# Patient Record
Sex: Female | Born: 1965 | Race: White | Hispanic: No | Smoking: Current every day smoker
Health system: Southern US, Community
[De-identification: ages and names within clinical notes are randomized; demographics above are authoritative.]

## PROBLEM LIST (undated history)

## (undated) DIAGNOSIS — F1020 Alcohol dependence, uncomplicated: Secondary | ICD-10-CM

## (undated) DIAGNOSIS — M545 Low back pain, unspecified: Secondary | ICD-10-CM

## (undated) DIAGNOSIS — F3110 Bipolar disorder, current episode manic without psychotic features, unspecified: Secondary | ICD-10-CM

## (undated) DIAGNOSIS — F99 Mental disorder, not otherwise specified: Secondary | ICD-10-CM

## (undated) DIAGNOSIS — E0590 Thyrotoxicosis, unspecified without thyrotoxic crisis or storm: Secondary | ICD-10-CM

## (undated) DIAGNOSIS — R51 Headache: Secondary | ICD-10-CM

## (undated) DIAGNOSIS — G8929 Other chronic pain: Secondary | ICD-10-CM

## (undated) DIAGNOSIS — J449 Chronic obstructive pulmonary disease, unspecified: Secondary | ICD-10-CM

## (undated) DIAGNOSIS — C801 Malignant (primary) neoplasm, unspecified: Secondary | ICD-10-CM

## (undated) HISTORY — PX: OTHER SURGICAL HISTORY: SHX169

## (undated) HISTORY — PX: TUBAL LIGATION: SHX77

## (undated) HISTORY — PX: BACK SURGERY: SHX140

---

## 1987-08-14 DIAGNOSIS — C801 Malignant (primary) neoplasm, unspecified: Secondary | ICD-10-CM

## 1987-08-14 HISTORY — DX: Malignant (primary) neoplasm, unspecified: C80.1

## 1991-08-14 DIAGNOSIS — E0590 Thyrotoxicosis, unspecified without thyrotoxic crisis or storm: Secondary | ICD-10-CM

## 1991-08-14 DIAGNOSIS — F3110 Bipolar disorder, current episode manic without psychotic features, unspecified: Secondary | ICD-10-CM

## 1991-08-14 HISTORY — DX: Bipolar disorder, current episode manic without psychotic features, unspecified: F31.10

## 1991-08-14 HISTORY — DX: Thyrotoxicosis, unspecified without thyrotoxic crisis or storm: E05.90

## 2001-08-13 HISTORY — PX: BREAST SURGERY: SHX581

## 2001-10-25 ENCOUNTER — Emergency Department (HOSPITAL_COMMUNITY): Admission: EM | Admit: 2001-10-25 | Discharge: 2001-10-25 | Payer: Self-pay | Admitting: Emergency Medicine

## 2001-11-14 ENCOUNTER — Emergency Department (HOSPITAL_COMMUNITY): Admission: EM | Admit: 2001-11-14 | Discharge: 2001-11-14 | Payer: Self-pay | Admitting: Emergency Medicine

## 2002-02-09 ENCOUNTER — Emergency Department (HOSPITAL_COMMUNITY): Admission: EM | Admit: 2002-02-09 | Discharge: 2002-02-09 | Payer: Self-pay | Admitting: Emergency Medicine

## 2002-05-08 ENCOUNTER — Emergency Department (HOSPITAL_COMMUNITY): Admission: EM | Admit: 2002-05-08 | Discharge: 2002-05-08 | Payer: Self-pay | Admitting: *Deleted

## 2002-07-19 ENCOUNTER — Emergency Department (HOSPITAL_COMMUNITY): Admission: EM | Admit: 2002-07-19 | Discharge: 2002-07-19 | Payer: Self-pay | Admitting: Internal Medicine

## 2002-07-19 ENCOUNTER — Encounter: Payer: Self-pay | Admitting: Internal Medicine

## 2003-08-02 ENCOUNTER — Ambulatory Visit (HOSPITAL_COMMUNITY): Admission: RE | Admit: 2003-08-02 | Discharge: 2003-08-02 | Payer: Self-pay | Admitting: Obstetrics & Gynecology

## 2003-08-20 ENCOUNTER — Emergency Department (HOSPITAL_COMMUNITY): Admission: EM | Admit: 2003-08-20 | Discharge: 2003-08-20 | Payer: Self-pay | Admitting: Emergency Medicine

## 2003-11-13 ENCOUNTER — Emergency Department (HOSPITAL_COMMUNITY): Admission: EM | Admit: 2003-11-13 | Discharge: 2003-11-13 | Payer: Self-pay | Admitting: Emergency Medicine

## 2004-02-16 ENCOUNTER — Emergency Department (HOSPITAL_COMMUNITY): Admission: EM | Admit: 2004-02-16 | Discharge: 2004-02-16 | Payer: Self-pay | Admitting: *Deleted

## 2004-03-01 ENCOUNTER — Inpatient Hospital Stay (HOSPITAL_COMMUNITY): Admission: RE | Admit: 2004-03-01 | Discharge: 2004-03-06 | Payer: Self-pay | Admitting: Psychiatry

## 2004-03-16 ENCOUNTER — Emergency Department (HOSPITAL_COMMUNITY): Admission: EM | Admit: 2004-03-16 | Discharge: 2004-03-16 | Payer: Self-pay | Admitting: *Deleted

## 2004-05-20 ENCOUNTER — Emergency Department (HOSPITAL_COMMUNITY): Admission: EM | Admit: 2004-05-20 | Discharge: 2004-05-20 | Payer: Self-pay | Admitting: Emergency Medicine

## 2006-02-11 ENCOUNTER — Emergency Department (HOSPITAL_COMMUNITY): Admission: EM | Admit: 2006-02-11 | Discharge: 2006-02-11 | Payer: Self-pay | Admitting: Emergency Medicine

## 2006-03-11 ENCOUNTER — Emergency Department (HOSPITAL_COMMUNITY): Admission: EM | Admit: 2006-03-11 | Discharge: 2006-03-11 | Payer: Self-pay | Admitting: Emergency Medicine

## 2006-05-21 ENCOUNTER — Emergency Department (HOSPITAL_COMMUNITY): Admission: EM | Admit: 2006-05-21 | Discharge: 2006-05-21 | Payer: Self-pay | Admitting: Emergency Medicine

## 2006-06-27 ENCOUNTER — Emergency Department (HOSPITAL_COMMUNITY): Admission: EM | Admit: 2006-06-27 | Discharge: 2006-06-27 | Payer: Self-pay | Admitting: Emergency Medicine

## 2006-08-13 HISTORY — PX: OTHER SURGICAL HISTORY: SHX169

## 2007-02-26 ENCOUNTER — Ambulatory Visit (HOSPITAL_COMMUNITY): Admission: RE | Admit: 2007-02-26 | Discharge: 2007-02-26 | Payer: Self-pay | Admitting: Family Medicine

## 2008-04-21 ENCOUNTER — Emergency Department (HOSPITAL_COMMUNITY): Admission: EM | Admit: 2008-04-21 | Discharge: 2008-04-21 | Payer: Self-pay | Admitting: Emergency Medicine

## 2008-06-09 ENCOUNTER — Emergency Department (HOSPITAL_COMMUNITY): Admission: EM | Admit: 2008-06-09 | Discharge: 2008-06-09 | Payer: Self-pay | Admitting: Emergency Medicine

## 2008-06-16 ENCOUNTER — Emergency Department (HOSPITAL_COMMUNITY): Admission: EM | Admit: 2008-06-16 | Discharge: 2008-06-16 | Payer: Self-pay | Admitting: Emergency Medicine

## 2008-06-21 ENCOUNTER — Ambulatory Visit (HOSPITAL_COMMUNITY): Admission: RE | Admit: 2008-06-21 | Discharge: 2008-06-21 | Payer: Self-pay | Admitting: Family Medicine

## 2008-11-03 ENCOUNTER — Emergency Department (HOSPITAL_COMMUNITY): Admission: EM | Admit: 2008-11-03 | Discharge: 2008-11-03 | Payer: Self-pay | Admitting: Emergency Medicine

## 2009-01-06 ENCOUNTER — Emergency Department (HOSPITAL_COMMUNITY): Admission: EM | Admit: 2009-01-06 | Discharge: 2009-01-06 | Payer: Self-pay | Admitting: Emergency Medicine

## 2009-05-10 ENCOUNTER — Emergency Department (HOSPITAL_COMMUNITY): Admission: EM | Admit: 2009-05-10 | Discharge: 2009-05-10 | Payer: Self-pay | Admitting: Emergency Medicine

## 2009-06-01 ENCOUNTER — Emergency Department (HOSPITAL_COMMUNITY): Admission: EM | Admit: 2009-06-01 | Discharge: 2009-06-01 | Payer: Self-pay | Admitting: Emergency Medicine

## 2010-01-15 ENCOUNTER — Emergency Department (HOSPITAL_COMMUNITY): Admission: EM | Admit: 2010-01-15 | Discharge: 2010-01-15 | Payer: Self-pay | Admitting: Emergency Medicine

## 2010-03-12 ENCOUNTER — Emergency Department (HOSPITAL_COMMUNITY): Admission: EM | Admit: 2010-03-12 | Discharge: 2010-03-12 | Payer: Self-pay | Admitting: Emergency Medicine

## 2010-03-30 ENCOUNTER — Other Ambulatory Visit: Payer: Self-pay | Admitting: Emergency Medicine

## 2010-03-30 ENCOUNTER — Inpatient Hospital Stay (HOSPITAL_COMMUNITY): Admission: AD | Admit: 2010-03-30 | Discharge: 2010-04-07 | Payer: Self-pay | Admitting: Psychiatry

## 2010-03-30 ENCOUNTER — Ambulatory Visit: Payer: Self-pay | Admitting: Psychiatry

## 2010-04-20 ENCOUNTER — Emergency Department (HOSPITAL_COMMUNITY): Admission: EM | Admit: 2010-04-20 | Discharge: 2010-04-20 | Payer: Self-pay | Admitting: Emergency Medicine

## 2010-04-25 ENCOUNTER — Emergency Department (HOSPITAL_COMMUNITY): Admission: EM | Admit: 2010-04-25 | Discharge: 2010-04-26 | Payer: Self-pay | Admitting: Emergency Medicine

## 2010-05-06 ENCOUNTER — Emergency Department (HOSPITAL_COMMUNITY): Admission: EM | Admit: 2010-05-06 | Discharge: 2010-05-06 | Payer: Self-pay | Admitting: Emergency Medicine

## 2010-05-15 ENCOUNTER — Ambulatory Visit (HOSPITAL_COMMUNITY): Admission: RE | Admit: 2010-05-15 | Discharge: 2010-05-15 | Payer: Self-pay | Admitting: Family Medicine

## 2010-05-18 ENCOUNTER — Emergency Department (HOSPITAL_COMMUNITY): Admission: EM | Admit: 2010-05-18 | Discharge: 2010-05-18 | Payer: Self-pay | Admitting: Emergency Medicine

## 2010-06-03 ENCOUNTER — Emergency Department (HOSPITAL_COMMUNITY): Admission: EM | Admit: 2010-06-03 | Discharge: 2010-06-03 | Payer: Self-pay | Admitting: Emergency Medicine

## 2010-07-19 ENCOUNTER — Emergency Department (HOSPITAL_COMMUNITY)
Admission: EM | Admit: 2010-07-19 | Discharge: 2010-07-19 | Payer: Self-pay | Source: Home / Self Care | Admitting: Emergency Medicine

## 2010-07-25 ENCOUNTER — Emergency Department (HOSPITAL_COMMUNITY)
Admission: EM | Admit: 2010-07-25 | Discharge: 2010-07-25 | Payer: Self-pay | Source: Home / Self Care | Admitting: Emergency Medicine

## 2010-07-28 ENCOUNTER — Emergency Department (HOSPITAL_COMMUNITY)
Admission: EM | Admit: 2010-07-28 | Discharge: 2010-07-28 | Payer: Self-pay | Source: Home / Self Care | Admitting: Emergency Medicine

## 2010-08-05 ENCOUNTER — Emergency Department (HOSPITAL_COMMUNITY)
Admission: EM | Admit: 2010-08-05 | Discharge: 2010-08-05 | Payer: Self-pay | Source: Home / Self Care | Admitting: Emergency Medicine

## 2010-08-14 ENCOUNTER — Emergency Department (HOSPITAL_COMMUNITY)
Admission: EM | Admit: 2010-08-14 | Discharge: 2010-08-14 | Payer: Self-pay | Source: Home / Self Care | Admitting: Emergency Medicine

## 2010-09-03 ENCOUNTER — Encounter: Payer: Self-pay | Admitting: Family Medicine

## 2010-10-13 ENCOUNTER — Emergency Department (HOSPITAL_COMMUNITY): Payer: Medicare Other

## 2010-10-13 ENCOUNTER — Emergency Department (HOSPITAL_COMMUNITY)
Admission: EM | Admit: 2010-10-13 | Discharge: 2010-10-14 | Disposition: A | Discharge status: Home / Self Care | Payer: Medicare Other | Attending: Emergency Medicine | Admitting: Emergency Medicine

## 2010-10-13 DIAGNOSIS — F1420 Cocaine dependence, uncomplicated: Secondary | ICD-10-CM | POA: Insufficient documentation

## 2010-10-13 DIAGNOSIS — R51 Headache: Secondary | ICD-10-CM | POA: Insufficient documentation

## 2010-10-13 DIAGNOSIS — F10229 Alcohol dependence with intoxication, unspecified: Secondary | ICD-10-CM | POA: Insufficient documentation

## 2010-10-13 DIAGNOSIS — R05 Cough: Secondary | ICD-10-CM | POA: Insufficient documentation

## 2010-10-13 DIAGNOSIS — R059 Cough, unspecified: Secondary | ICD-10-CM | POA: Insufficient documentation

## 2010-10-13 LAB — BASIC METABOLIC PANEL
BUN: 3 mg/dL — ABNORMAL LOW (ref 6–23)
CO2: 22 mEq/L (ref 19–32)
Calcium: 8.8 mg/dL (ref 8.4–10.5)
GFR calc non Af Amer: >60 mL/min (ref >60)
Glucose, Bld: 92 mg/dL (ref 70–99)

## 2010-10-14 LAB — RAPID URINE DRUG SCREEN, HOSP PERFORMED
Barbiturates: NOT DETECTED
Benzodiazepines: NOT DETECTED
Tetrahydrocannabinol: NOT DETECTED

## 2010-10-14 LAB — POCT CARDIAC MARKERS
CKMB, poc: <1 ng/mL — ABNORMAL LOW (ref 1.0–8.0)
Troponin i, poc: <0.05 ng/mL (ref 0.00–0.09)

## 2010-10-14 LAB — URINALYSIS, ROUTINE W REFLEX MICROSCOPIC
Glucose, UA: NEGATIVE mg/dL
pH: 5 (ref 5.0–8.0)

## 2010-10-23 LAB — RAPID URINE DRUG SCREEN, HOSP PERFORMED
Amphetamines: NOT DETECTED
Amphetamines: NOT DETECTED
Barbiturates: NOT DETECTED
Barbiturates: NOT DETECTED
Cocaine: NOT DETECTED
Cocaine: NOT DETECTED
Opiates: NOT DETECTED
Opiates: NOT DETECTED
Tetrahydrocannabinol: POSITIVE — AB
Tetrahydrocannabinol: NOT DETECTED

## 2010-10-23 LAB — COMPREHENSIVE METABOLIC PANEL
ALT: 35 U/L (ref 0–35)
AST: 70 U/L — ABNORMAL HIGH (ref 0–37)
Albumin: 3.9 g/dL (ref 3.5–5.2)
Albumin: 4 g/dL (ref 3.5–5.2)
BUN: 4 mg/dL — ABNORMAL LOW (ref 6–23)
CO2: 23 mEq/L (ref 19–32)
Calcium: 8.7 mg/dL (ref 8.4–10.5)
Calcium: 8.8 mg/dL (ref 8.4–10.5)
Creatinine, Ser: 0.7 mg/dL (ref 0.4–1.2)
Creatinine, Ser: 0.71 mg/dL (ref 0.4–1.2)
GFR calc Af Amer: >60 mL/min (ref >60)
GFR calc non Af Amer: >60 mL/min (ref >60)
GFR calc non Af Amer: >60 mL/min (ref >60)
Total Bilirubin: 0.5 mg/dL (ref 0.3–1.2)
Total Bilirubin: 0.5 mg/dL (ref 0.3–1.2)
Total Protein: 7.5 g/dL (ref 6.0–8.3)

## 2010-10-23 LAB — DIFFERENTIAL
Basophils Absolute: 0 10*3/uL (ref 0.0–0.1)
Basophils Relative: 0 % (ref 0–1)
Eosinophils Absolute: 0.1 10*3/uL (ref 0.0–0.7)
Eosinophils Absolute: 0.1 10*3/uL (ref 0.0–0.7)
Eosinophils Relative: 1 % (ref 0–5)
Lymphocytes Relative: 50 % — ABNORMAL HIGH (ref 12–46)
Lymphs Abs: 3.2 10*3/uL (ref 0.7–4.0)
Monocytes Absolute: 0.6 10*3/uL (ref 0.1–1.0)
Monocytes Absolute: 0.6 10*3/uL (ref 0.1–1.0)
Monocytes Relative: 8 % (ref 3–12)
Neutrophils Relative %: 40 % — ABNORMAL LOW (ref 43–77)

## 2010-10-23 LAB — BASIC METABOLIC PANEL
CO2: 20 mEq/L (ref 19–32)
Calcium: 8.5 mg/dL (ref 8.4–10.5)
Chloride: 104 mEq/L (ref 96–112)
GFR calc Af Amer: >60 mL/min (ref >60)
Glucose, Bld: 73 mg/dL (ref 70–99)
Potassium: 3.8 mEq/L (ref 3.5–5.1)

## 2010-10-23 LAB — URINALYSIS, ROUTINE W REFLEX MICROSCOPIC
Bilirubin Urine: NEGATIVE
Glucose, UA: NEGATIVE mg/dL
Protein, ur: NEGATIVE mg/dL
Urobilinogen, UA: 0.2 mg/dL (ref 0.0–1.0)

## 2010-10-23 LAB — CBC
Hemoglobin: 15.7 g/dL — ABNORMAL HIGH (ref 12.0–15.0)
MCH: 36.1 pg — ABNORMAL HIGH (ref 26.0–34.0)
MCH: 36.6 pg — ABNORMAL HIGH (ref 26.0–34.0)
MCHC: 37.3 g/dL — ABNORMAL HIGH (ref 30.0–36.0)
MCV: 100.2 fL — ABNORMAL HIGH (ref 78.0–100.0)
MCV: 98.3 fL (ref 78.0–100.0)
Platelets: 191 10*3/uL (ref 150–400)
RBC: 4.35 MIL/uL (ref 3.87–5.11)

## 2010-10-23 LAB — LIPASE, BLOOD: Lipase: 43 U/L (ref 11–59)

## 2010-10-25 LAB — COMPREHENSIVE METABOLIC PANEL
ALT: 12 U/L (ref 0–35)
AST: 24 U/L (ref 0–37)
Alkaline Phosphatase: 65 U/L (ref 39–117)
CO2: 23 mEq/L (ref 19–32)
Calcium: 8.7 mg/dL (ref 8.4–10.5)
GFR calc Af Amer: >60 mL/min (ref >60)
GFR calc non Af Amer: >60 mL/min (ref >60)
Glucose, Bld: 81 mg/dL (ref 70–99)
Potassium: 4.3 mEq/L (ref 3.5–5.1)
Sodium: 132 mEq/L — ABNORMAL LOW (ref 135–145)

## 2010-10-25 LAB — URINALYSIS, ROUTINE W REFLEX MICROSCOPIC
Bilirubin Urine: NEGATIVE
Glucose, UA: NEGATIVE mg/dL
Glucose, UA: NEGATIVE mg/dL
Hgb urine dipstick: NEGATIVE
Ketones, ur: NEGATIVE mg/dL
Protein, ur: NEGATIVE mg/dL
Protein, ur: NEGATIVE mg/dL
Specific Gravity, Urine: 1.01 (ref 1.005–1.030)
pH: 5 (ref 5.0–8.0)
pH: 5.5 (ref 5.0–8.0)

## 2010-10-25 LAB — CBC
HCT: 42.4 % (ref 36.0–46.0)
HCT: 41.9 % (ref 36.0–46.0)
Hemoglobin: 14.8 g/dL (ref 12.0–15.0)
MCH: 35.8 pg — ABNORMAL HIGH (ref 26.0–34.0)
MCHC: 34.8 g/dL (ref 30.0–36.0)
MCHC: 34.5 g/dL (ref 30.0–36.0)
MCV: 103.9 fL — ABNORMAL HIGH (ref 78.0–100.0)
RBC: 4.1 MIL/uL (ref 3.87–5.11)
RDW: 13.6 % (ref 11.5–15.5)
WBC: 7.9 10*3/uL (ref 4.0–10.5)
WBC: 8.1 10*3/uL (ref 4.0–10.5)

## 2010-10-25 LAB — HEPATIC FUNCTION PANEL
ALT: 17 U/L (ref 0–35)
Bilirubin, Direct: 0.1 mg/dL (ref 0.0–0.3)
Indirect Bilirubin: 0.7 mg/dL (ref 0.3–0.9)

## 2010-10-25 LAB — DIFFERENTIAL
Basophils Absolute: 0.1 10*3/uL (ref 0.0–0.1)
Basophils Relative: 0 % (ref 0–1)
Eosinophils Absolute: 0 10*3/uL (ref 0.0–0.7)
Eosinophils Absolute: 0 10*3/uL (ref 0.0–0.7)
Eosinophils Relative: 0 % (ref 0–5)
Eosinophils Relative: 1 % (ref 0–5)
Lymphocytes Relative: 41 % (ref 12–46)
Lymphs Abs: 2.9 10*3/uL (ref 0.7–4.0)
Monocytes Absolute: 0.7 10*3/uL (ref 0.1–1.0)
Monocytes Relative: 7 % (ref 3–12)
Neutrophils Relative %: 54 % (ref 43–77)

## 2010-10-25 LAB — RAPID URINE DRUG SCREEN, HOSP PERFORMED
Barbiturates: NOT DETECTED
Benzodiazepines: POSITIVE — AB
Benzodiazepines: POSITIVE — AB
Cocaine: NOT DETECTED
Opiates: NOT DETECTED

## 2010-10-25 LAB — BASIC METABOLIC PANEL
BUN: 8 mg/dL (ref 6–23)
CO2: 20 mEq/L (ref 19–32)
Chloride: 102 mEq/L (ref 96–112)
Glucose, Bld: 76 mg/dL (ref 70–99)
Potassium: 3.7 mEq/L (ref 3.5–5.1)

## 2010-10-25 LAB — ETHANOL: Alcohol, Ethyl (B): 252 mg/dL — ABNORMAL HIGH (ref 0–10)

## 2010-10-25 LAB — URINE MICROSCOPIC-ADD ON

## 2010-10-25 LAB — AMMONIA: Ammonia: 19 umol/L (ref 11–35)

## 2010-10-25 LAB — POCT PREGNANCY, URINE: Preg Test, Ur: NEGATIVE

## 2010-10-26 LAB — BASIC METABOLIC PANEL
BUN: 6 mg/dL (ref 6–23)
BUN: 3 mg/dL — ABNORMAL LOW (ref 6–23)
BUN: 3 mg/dL — ABNORMAL LOW (ref 6–23)
CO2: 21 mEq/L (ref 19–32)
CO2: 21 mEq/L (ref 19–32)
Calcium: 9.2 mg/dL (ref 8.4–10.5)
Chloride: 107 mEq/L (ref 96–112)
Chloride: 108 mEq/L (ref 96–112)
Creatinine, Ser: 0.61 mg/dL (ref 0.4–1.2)
Creatinine, Ser: 0.61 mg/dL (ref 0.4–1.2)
GFR calc Af Amer: >60 mL/min (ref >60)
GFR calc non Af Amer: >60 mL/min (ref >60)
Glucose, Bld: 91 mg/dL (ref 70–99)
Glucose, Bld: 92 mg/dL (ref 70–99)
Potassium: 4.2 mEq/L (ref 3.5–5.1)
Sodium: 138 mEq/L (ref 135–145)

## 2010-10-26 LAB — CBC
HCT: 40.9 % (ref 36.0–46.0)
HCT: 40.6 % (ref 36.0–46.0)
HCT: 46.7 % — ABNORMAL HIGH (ref 36.0–46.0)
MCH: 35.7 pg — ABNORMAL HIGH (ref 26.0–34.0)
MCH: 35.8 pg — ABNORMAL HIGH (ref 26.0–34.0)
MCHC: 34.7 g/dL (ref 30.0–36.0)
MCHC: 33.6 g/dL (ref 30.0–36.0)
MCV: 103.4 fL — ABNORMAL HIGH (ref 78.0–100.0)
MCV: 103.4 fL — ABNORMAL HIGH (ref 78.0–100.0)
Platelets: 291 10*3/uL (ref 150–400)
Platelets: 264 10*3/uL (ref 150–400)
RBC: 3.96 MIL/uL (ref 3.87–5.11)
RDW: 13.1 % (ref 11.5–15.5)
RDW: 13.6 % (ref 11.5–15.5)
WBC: 7 10*3/uL (ref 4.0–10.5)
WBC: 7.2 10*3/uL (ref 4.0–10.5)

## 2010-10-26 LAB — DIFFERENTIAL
Basophils Absolute: 0 10*3/uL (ref 0.0–0.1)
Basophils Absolute: 0 10*3/uL (ref 0.0–0.1)
Basophils Relative: 0 % (ref 0–1)
Eosinophils Absolute: 0.1 10*3/uL (ref 0.0–0.7)
Eosinophils Absolute: 0.1 10*3/uL (ref 0.0–0.7)
Eosinophils Relative: 1 % (ref 0–5)
Eosinophils Relative: 2 % (ref 0–5)
Lymphocytes Relative: 44 % (ref 12–46)
Lymphs Abs: 3.3 10*3/uL (ref 0.7–4.0)
Monocytes Absolute: 0.4 10*3/uL (ref 0.1–1.0)
Monocytes Absolute: 0.5 10*3/uL (ref 0.1–1.0)
Monocytes Absolute: 0.5 10*3/uL (ref 0.1–1.0)
Neutro Abs: 3.5 10*3/uL (ref 1.7–7.7)
Neutrophils Relative %: 48 % (ref 43–77)

## 2010-10-26 LAB — RAPID URINE DRUG SCREEN, HOSP PERFORMED
Amphetamines: NOT DETECTED
Barbiturates: NOT DETECTED
Benzodiazepines: NOT DETECTED
Cocaine: NOT DETECTED
Cocaine: POSITIVE — AB
Tetrahydrocannabinol: NOT DETECTED

## 2010-10-26 LAB — COMPREHENSIVE METABOLIC PANEL
ALT: 21 U/L (ref 0–35)
AST: 63 U/L — ABNORMAL HIGH (ref 0–37)
Albumin: 4.2 g/dL (ref 3.5–5.2)
Alkaline Phosphatase: 79 U/L (ref 39–117)
Chloride: 105 mEq/L (ref 96–112)
GFR calc Af Amer: >60 mL/min (ref >60)
Potassium: 3.9 mEq/L (ref 3.5–5.1)
Sodium: 137 mEq/L (ref 135–145)
Total Bilirubin: 1.2 mg/dL (ref 0.3–1.2)
Total Protein: 8 g/dL (ref 6.0–8.3)

## 2010-10-26 LAB — URINALYSIS, ROUTINE W REFLEX MICROSCOPIC
Bilirubin Urine: NEGATIVE
Hgb urine dipstick: NEGATIVE
Ketones, ur: NEGATIVE mg/dL
Nitrite: NEGATIVE
Urobilinogen, UA: 0.2 mg/dL (ref 0.0–1.0)

## 2010-10-26 LAB — ETHANOL
Alcohol, Ethyl (B): 240 mg/dL — ABNORMAL HIGH (ref 0–10)
Alcohol, Ethyl (B): 297 mg/dL — ABNORMAL HIGH (ref 0–10)

## 2010-10-26 LAB — TSH: TSH: 11.134 u[IU]/mL — ABNORMAL HIGH (ref 0.350–4.500)

## 2010-10-26 LAB — PREGNANCY, URINE: Preg Test, Ur: NEGATIVE

## 2010-10-28 LAB — URINE MICROSCOPIC-ADD ON

## 2010-10-28 LAB — URINALYSIS, ROUTINE W REFLEX MICROSCOPIC
Bilirubin Urine: NEGATIVE
Glucose, UA: NEGATIVE mg/dL
Ketones, ur: NEGATIVE mg/dL
Leukocytes, UA: NEGATIVE
Nitrite: NEGATIVE
Protein, ur: NEGATIVE mg/dL
Specific Gravity, Urine: <1.005 — ABNORMAL LOW (ref 1.005–1.030)
Urobilinogen, UA: 0.2 mg/dL (ref 0.0–1.0)
pH: 6 (ref 5.0–8.0)

## 2010-10-30 LAB — RAPID URINE DRUG SCREEN, HOSP PERFORMED
Amphetamines: NOT DETECTED
Barbiturates: NOT DETECTED
Benzodiazepines: POSITIVE — AB
Cocaine: NOT DETECTED
Opiates: NOT DETECTED
Tetrahydrocannabinol: NOT DETECTED

## 2010-10-30 LAB — DIFFERENTIAL
Basophils Absolute: 0.1 K/uL (ref 0.0–0.1)
Basophils Relative: 1 % (ref 0–1)
Eosinophils Absolute: 0.1 K/uL (ref 0.0–0.7)
Eosinophils Relative: 1 % (ref 0–5)
Lymphocytes Relative: 24 % (ref 12–46)
Lymphs Abs: 1.9 K/uL (ref 0.7–4.0)
Monocytes Absolute: 0.6 K/uL (ref 0.1–1.0)
Monocytes Relative: 7 % (ref 3–12)
Neutro Abs: 5.3 10*3/uL (ref 1.7–7.7)
Neutrophils Relative %: 67 % (ref 43–77)

## 2010-10-30 LAB — GLUCOSE, CAPILLARY: Glucose-Capillary: 88 mg/dL (ref 70–99)

## 2010-10-30 LAB — COMPREHENSIVE METABOLIC PANEL
AST: 38 U/L — ABNORMAL HIGH (ref 0–37)
Albumin: 3.4 g/dL — ABNORMAL LOW (ref 3.5–5.2)
BUN: 4 mg/dL — ABNORMAL LOW (ref 6–23)
Creatinine, Ser: 0.66 mg/dL (ref 0.4–1.2)
GFR calc Af Amer: >60 mL/min (ref >60)
Potassium: 4 mEq/L (ref 3.5–5.1)
Total Protein: 6.6 g/dL (ref 6.0–8.3)

## 2010-10-30 LAB — COMPREHENSIVE METABOLIC PANEL WITH GFR
ALT: 16 U/L (ref 0–35)
Alkaline Phosphatase: 73 U/L (ref 39–117)
CO2: 23 meq/L (ref 19–32)
Calcium: 8.7 mg/dL (ref 8.4–10.5)
Chloride: 103 meq/L (ref 96–112)
GFR calc non Af Amer: >60 mL/min (ref >60)
Glucose, Bld: 80 mg/dL (ref 70–99)
Sodium: 136 meq/L (ref 135–145)
Total Bilirubin: 0.4 mg/dL (ref 0.3–1.2)

## 2010-10-30 LAB — CBC
HCT: 42.3 % (ref 36.0–46.0)
Hemoglobin: 14.4 g/dL (ref 12.0–15.0)
MCHC: 34.1 g/dL (ref 30.0–36.0)
MCV: 110.9 fL — ABNORMAL HIGH (ref 78.0–100.0)
Platelets: 184 10*3/uL (ref 150–400)
RBC: 3.82 MIL/uL — ABNORMAL LOW (ref 3.87–5.11)
RDW: 13.7 % (ref 11.5–15.5)
WBC: 8 K/uL (ref 4.0–10.5)

## 2010-10-30 LAB — POCT PREGNANCY, URINE: Preg Test, Ur: NEGATIVE

## 2010-10-30 LAB — URINALYSIS, ROUTINE W REFLEX MICROSCOPIC
Bilirubin Urine: NEGATIVE
Glucose, UA: NEGATIVE mg/dL
Hgb urine dipstick: NEGATIVE
Ketones, ur: NEGATIVE mg/dL
Nitrite: NEGATIVE
Protein, ur: NEGATIVE mg/dL
Specific Gravity, Urine: <1.005 — ABNORMAL LOW (ref 1.005–1.030)
Urobilinogen, UA: 0.2 mg/dL (ref 0.0–1.0)
pH: 6 (ref 5.0–8.0)

## 2010-10-30 LAB — ETHANOL: Alcohol, Ethyl (B): 157 mg/dL — ABNORMAL HIGH (ref 0–10)

## 2010-11-16 LAB — COMPREHENSIVE METABOLIC PANEL
Albumin: 4 g/dL (ref 3.5–5.2)
BUN: 3 mg/dL — ABNORMAL LOW (ref 6–23)
Calcium: 8.5 mg/dL (ref 8.4–10.5)
Creatinine, Ser: 0.75 mg/dL (ref 0.4–1.2)
Potassium: 3.7 mEq/L (ref 3.5–5.1)
Total Protein: 7.1 g/dL (ref 6.0–8.3)

## 2010-11-16 LAB — CBC
HCT: 40.3 % (ref 36.0–46.0)
MCHC: 34.7 g/dL (ref 30.0–36.0)
Platelets: 214 10*3/uL (ref 150–400)
RDW: 14.5 % (ref 11.5–15.5)

## 2010-11-16 LAB — DIFFERENTIAL
Lymphocytes Relative: 52 % — ABNORMAL HIGH (ref 12–46)
Lymphs Abs: 3.5 10*3/uL (ref 0.7–4.0)
Monocytes Absolute: 0.5 10*3/uL (ref 0.1–1.0)
Monocytes Relative: 8 % (ref 3–12)
Neutro Abs: 2.6 10*3/uL (ref 1.7–7.7)
Neutrophils Relative %: 38 % — ABNORMAL LOW (ref 43–77)

## 2010-11-16 LAB — URINALYSIS, ROUTINE W REFLEX MICROSCOPIC
Glucose, UA: NEGATIVE mg/dL
Hgb urine dipstick: NEGATIVE
Specific Gravity, Urine: <1.005 — ABNORMAL LOW (ref 1.005–1.030)

## 2010-11-17 LAB — URINE MICROSCOPIC-ADD ON

## 2010-11-17 LAB — CBC
Hemoglobin: 14.3 g/dL (ref 12.0–15.0)
MCHC: 35.1 g/dL (ref 30.0–36.0)
MCV: 104.3 fL — ABNORMAL HIGH (ref 78.0–100.0)
RBC: 3.89 MIL/uL (ref 3.87–5.11)
RDW: 14.2 % (ref 11.5–15.5)

## 2010-11-17 LAB — COMPREHENSIVE METABOLIC PANEL
CO2: 20 mEq/L (ref 19–32)
Calcium: 8.7 mg/dL (ref 8.4–10.5)
Creatinine, Ser: 0.71 mg/dL (ref 0.4–1.2)
GFR calc non Af Amer: >60 mL/min (ref >60)
Glucose, Bld: 84 mg/dL (ref 70–99)
Sodium: 136 mEq/L (ref 135–145)
Total Protein: 7.4 g/dL (ref 6.0–8.3)

## 2010-11-17 LAB — DIFFERENTIAL
Eosinophils Absolute: 0 10*3/uL (ref 0.0–0.7)
Lymphocytes Relative: 40 % (ref 12–46)
Lymphs Abs: 3.3 10*3/uL (ref 0.7–4.0)
Monocytes Relative: 5 % (ref 3–12)
Neutro Abs: 4.3 10*3/uL (ref 1.7–7.7)
Neutrophils Relative %: 54 % (ref 43–77)

## 2010-11-17 LAB — URINALYSIS, ROUTINE W REFLEX MICROSCOPIC
Glucose, UA: NEGATIVE mg/dL
Ketones, ur: NEGATIVE mg/dL
Nitrite: NEGATIVE
Protein, ur: NEGATIVE mg/dL
pH: 5.5 (ref 5.0–8.0)

## 2010-11-17 LAB — LIPASE, BLOOD: Lipase: 37 U/L (ref 11–59)

## 2010-11-18 ENCOUNTER — Emergency Department (HOSPITAL_COMMUNITY)
Admission: EM | Admit: 2010-11-18 | Discharge: 2010-11-18 | Disposition: A | Discharge status: Home / Self Care | Payer: Medicare Other | Attending: Emergency Medicine | Admitting: Emergency Medicine

## 2010-11-18 DIAGNOSIS — F17200 Nicotine dependence, unspecified, uncomplicated: Secondary | ICD-10-CM | POA: Insufficient documentation

## 2010-11-18 DIAGNOSIS — M545 Low back pain, unspecified: Secondary | ICD-10-CM | POA: Insufficient documentation

## 2010-11-18 DIAGNOSIS — Y998 Other external cause status: Secondary | ICD-10-CM | POA: Insufficient documentation

## 2010-11-21 LAB — BASIC METABOLIC PANEL
BUN: 4 mg/dL — ABNORMAL LOW (ref 6–23)
Calcium: 9.1 mg/dL (ref 8.4–10.5)
Chloride: 99 mEq/L (ref 96–112)
GFR calc Af Amer: >60 mL/min (ref >60)
GFR calc non Af Amer: >60 mL/min (ref >60)
Glucose, Bld: 84 mg/dL (ref 70–99)
Potassium: 3.8 mEq/L (ref 3.5–5.1)
Sodium: 133 mEq/L — ABNORMAL LOW (ref 135–145)

## 2010-11-21 LAB — DIFFERENTIAL
Basophils Absolute: 0 10*3/uL (ref 0.0–0.1)
Basophils Relative: 1 % (ref 0–1)
Eosinophils Relative: 1 % (ref 0–5)
Lymphocytes Relative: 32 % (ref 12–46)
Neutro Abs: 4.8 10*3/uL (ref 1.7–7.7)

## 2010-11-21 LAB — CBC
HCT: 41.9 % (ref 36.0–46.0)
Platelets: 239 10*3/uL (ref 150–400)
RDW: 14.6 % (ref 11.5–15.5)

## 2010-11-21 LAB — RAPID URINE DRUG SCREEN, HOSP PERFORMED
Barbiturates: NOT DETECTED
Benzodiazepines: NOT DETECTED

## 2010-11-23 LAB — URINALYSIS, ROUTINE W REFLEX MICROSCOPIC
Bilirubin Urine: NEGATIVE
Glucose, UA: NEGATIVE mg/dL
Hgb urine dipstick: NEGATIVE
Ketones, ur: NEGATIVE mg/dL
Protein, ur: NEGATIVE mg/dL
Urobilinogen, UA: 0.2 mg/dL (ref 0.0–1.0)

## 2010-11-23 LAB — LIPASE, BLOOD: Lipase: 50 U/L (ref 11–59)

## 2010-11-23 LAB — COMPREHENSIVE METABOLIC PANEL
ALT: 50 U/L — ABNORMAL HIGH (ref 0–35)
AST: 93 U/L — ABNORMAL HIGH (ref 0–37)
CO2: 23 mEq/L (ref 19–32)
Calcium: 9.3 mg/dL (ref 8.4–10.5)
Chloride: 102 mEq/L (ref 96–112)
Creatinine, Ser: 0.68 mg/dL (ref 0.4–1.2)
GFR calc non Af Amer: >60 mL/min (ref >60)
Glucose, Bld: 100 mg/dL — ABNORMAL HIGH (ref 70–99)
Total Bilirubin: 0.4 mg/dL (ref 0.3–1.2)

## 2010-11-23 LAB — DIFFERENTIAL
Basophils Relative: 1 % (ref 0–1)
Eosinophils Absolute: 0 10*3/uL (ref 0.0–0.7)
Eosinophils Relative: 1 % (ref 0–5)
Lymphs Abs: 3.5 10*3/uL (ref 0.7–4.0)
Monocytes Absolute: 0.5 10*3/uL (ref 0.1–1.0)
Monocytes Relative: 5 % (ref 3–12)
Neutrophils Relative %: 54 % (ref 43–77)

## 2010-11-23 LAB — CBC
HCT: 41.1 % (ref 36.0–46.0)
Hemoglobin: 14.3 g/dL (ref 12.0–15.0)
MCHC: 34.8 g/dL (ref 30.0–36.0)
MCV: 104.7 fL — ABNORMAL HIGH (ref 78.0–100.0)
RBC: 3.92 MIL/uL (ref 3.87–5.11)

## 2010-11-23 LAB — BASIC METABOLIC PANEL
CO2: 24 mEq/L (ref 19–32)
Chloride: 101 mEq/L (ref 96–112)
Creatinine, Ser: 0.69 mg/dL (ref 0.4–1.2)
GFR calc Af Amer: >60 mL/min (ref >60)
Potassium: 5.1 mEq/L (ref 3.5–5.1)
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
## 2011-01-09 ENCOUNTER — Emergency Department (HOSPITAL_COMMUNITY)
Admission: EM | Admit: 2011-01-09 | Discharge: 2011-01-09 | Disposition: A | Discharge status: Home / Self Care | Payer: Medicare Other | Attending: Emergency Medicine | Admitting: Emergency Medicine

## 2011-01-09 ENCOUNTER — Emergency Department (HOSPITAL_COMMUNITY): Payer: Medicare Other

## 2011-01-09 DIAGNOSIS — R221 Localized swelling, mass and lump, neck: Secondary | ICD-10-CM | POA: Insufficient documentation

## 2011-01-09 DIAGNOSIS — S0003XA Contusion of scalp, initial encounter: Secondary | ICD-10-CM | POA: Insufficient documentation

## 2011-01-09 DIAGNOSIS — I10 Essential (primary) hypertension: Secondary | ICD-10-CM | POA: Insufficient documentation

## 2011-01-09 DIAGNOSIS — F319 Bipolar disorder, unspecified: Secondary | ICD-10-CM | POA: Insufficient documentation

## 2011-01-09 DIAGNOSIS — R220 Localized swelling, mass and lump, head: Secondary | ICD-10-CM | POA: Insufficient documentation

## 2011-01-09 DIAGNOSIS — S2249XA Multiple fractures of ribs, unspecified side, initial encounter for closed fracture: Secondary | ICD-10-CM | POA: Insufficient documentation

## 2011-01-09 DIAGNOSIS — W19XXXA Unspecified fall, initial encounter: Secondary | ICD-10-CM | POA: Insufficient documentation

## 2011-01-09 DIAGNOSIS — F1010 Alcohol abuse, uncomplicated: Secondary | ICD-10-CM | POA: Insufficient documentation

## 2011-04-06 ENCOUNTER — Other Ambulatory Visit (HOSPITAL_COMMUNITY): Payer: Self-pay | Admitting: Family Medicine

## 2011-04-06 DIAGNOSIS — Z139 Encounter for screening, unspecified: Secondary | ICD-10-CM

## 2011-05-14 LAB — DIFFERENTIAL
Basophils Relative: 1
Lymphocytes Relative: 39
Monocytes Relative: 7
Neutro Abs: 3.7
Neutrophils Relative %: 53

## 2011-05-14 LAB — COMPREHENSIVE METABOLIC PANEL
Albumin: 4.3
Alkaline Phosphatase: 59
BUN: 6
Calcium: 8.7
Creatinine, Ser: 0.82
Glucose, Bld: 86
Potassium: 4.1
Total Protein: 7.6

## 2011-05-14 LAB — URINALYSIS, ROUTINE W REFLEX MICROSCOPIC
Bilirubin Urine: NEGATIVE
Glucose, UA: NEGATIVE
Hgb urine dipstick: NEGATIVE
Ketones, ur: NEGATIVE
Protein, ur: NEGATIVE
pH: 6

## 2011-05-14 LAB — CBC
HCT: 42.4
Hemoglobin: 14.5
MCHC: 34.3
MCV: 104.1 — ABNORMAL HIGH
Platelets: 250
RDW: 14.6

## 2011-05-16 LAB — BASIC METABOLIC PANEL
CO2: 20
Calcium: 8.7
GFR calc Af Amer: >60
GFR calc non Af Amer: >60
Potassium: 4.1
Sodium: 132 — ABNORMAL LOW

## 2011-05-16 LAB — DIFFERENTIAL
Lymphocytes Relative: 41
Monocytes Absolute: 0.4
Monocytes Relative: 5
Neutro Abs: 4

## 2011-05-16 LAB — RAPID URINE DRUG SCREEN, HOSP PERFORMED
Amphetamines: NOT DETECTED
Benzodiazepines: NOT DETECTED
Cocaine: POSITIVE — AB
Tetrahydrocannabinol: NOT DETECTED

## 2011-05-16 LAB — CBC
HCT: 46
Hemoglobin: 15.8 — ABNORMAL HIGH
MCHC: 34.3
RBC: 4.5

## 2011-05-16 LAB — PREGNANCY, URINE: Preg Test, Ur: NEGATIVE

## 2011-05-21 ENCOUNTER — Ambulatory Visit (HOSPITAL_COMMUNITY)
Admission: RE | Admit: 2011-05-21 | Discharge: 2011-05-21 | Disposition: A | Discharge status: Home / Self Care | Payer: Medicare Other | Source: Ambulatory Visit | Attending: Family Medicine | Admitting: Family Medicine

## 2011-05-21 DIAGNOSIS — Z139 Encounter for screening, unspecified: Secondary | ICD-10-CM

## 2011-05-21 DIAGNOSIS — Z1231 Encounter for screening mammogram for malignant neoplasm of breast: Secondary | ICD-10-CM | POA: Insufficient documentation

## 2011-09-25 DIAGNOSIS — R51 Headache: Secondary | ICD-10-CM

## 2011-09-25 HISTORY — DX: Headache: R51

## 2011-09-28 ENCOUNTER — Emergency Department (HOSPITAL_COMMUNITY)
Admission: EM | Admit: 2011-09-28 | Discharge: 2011-09-28 | Disposition: A | Discharge status: Other Acute Inpatient Hospital | Payer: Medicare Other | Source: Home / Self Care | Attending: Emergency Medicine | Admitting: Emergency Medicine

## 2011-09-28 ENCOUNTER — Emergency Department (HOSPITAL_COMMUNITY): Payer: Medicare Other

## 2011-09-28 ENCOUNTER — Encounter (HOSPITAL_COMMUNITY): Payer: Self-pay

## 2011-09-28 DIAGNOSIS — R51 Headache: Secondary | ICD-10-CM | POA: Insufficient documentation

## 2011-09-28 DIAGNOSIS — F319 Bipolar disorder, unspecified: Secondary | ICD-10-CM | POA: Insufficient documentation

## 2011-09-28 DIAGNOSIS — F1010 Alcohol abuse, uncomplicated: Secondary | ICD-10-CM

## 2011-09-28 LAB — URINALYSIS, ROUTINE W REFLEX MICROSCOPIC
Glucose, UA: NEGATIVE mg/dL
Ketones, ur: NEGATIVE mg/dL
Leukocytes, UA: NEGATIVE
pH: 5 (ref 5.0–8.0)

## 2011-09-28 LAB — COMPREHENSIVE METABOLIC PANEL
Albumin: 3.5 g/dL (ref 3.5–5.2)
Alkaline Phosphatase: 82 U/L (ref 39–117)
BUN: 6 mg/dL (ref 6–23)
Potassium: 3.7 mEq/L (ref 3.5–5.1)
Sodium: 135 mEq/L (ref 135–145)
Total Protein: 7.3 g/dL (ref 6.0–8.3)

## 2011-09-28 LAB — CBC
MCH: 34 pg (ref 26.0–34.0)
MCHC: 34.1 g/dL (ref 30.0–36.0)
Platelets: 264 10*3/uL (ref 150–400)
RDW: 12.9 % (ref 11.5–15.5)

## 2011-09-28 LAB — DIFFERENTIAL
Basophils Absolute: 0 10*3/uL (ref 0.0–0.1)
Basophils Relative: 0 % (ref 0–1)
Eosinophils Absolute: 0.1 10*3/uL (ref 0.0–0.7)
Monocytes Relative: 5 % (ref 3–12)
Neutrophils Relative %: 47 % (ref 43–77)

## 2011-09-28 LAB — RAPID URINE DRUG SCREEN, HOSP PERFORMED
Amphetamines: NOT DETECTED
Benzodiazepines: NOT DETECTED
Opiates: NOT DETECTED

## 2011-09-28 MED ORDER — ACETAMINOPHEN 325 MG PO TABS: 650.0000 mg | ORAL_TABLET | ORAL | Status: DC | PRN

## 2011-09-28 MED ORDER — ONDANSETRON HCL 4 MG PO TABS: 4.0000 mg | ORAL_TABLET | Freq: Three times a day (TID) | ORAL | Status: DC | PRN

## 2011-09-28 MED ORDER — IBUPROFEN 400 MG PO TABS: 600.0000 mg | ORAL_TABLET | Freq: Three times a day (TID) | ORAL | Status: DC | PRN

## 2011-09-28 MED ORDER — NICOTINE 21 MG/24HR TD PT24
21.0000 mg | MEDICATED_PATCH | Freq: Every day | TRANSDERMAL | Status: DC
  Administered 2011-09-28: 21 mg via TRANSDERMAL
  Filled 2011-09-28: qty 1

## 2011-09-28 MED ORDER — ALUM & MAG HYDROXIDE-SIMETH 200-200-20 MG/5ML PO SUSP: 30.0000 mL | ORAL | Status: DC | PRN

## 2011-09-28 MED ORDER — ZOLPIDEM TARTRATE 5 MG PO TABS: 10.0000 mg | ORAL_TABLET | Freq: Every evening | ORAL | Status: DC | PRN

## 2011-09-28 MED ORDER — LORAZEPAM 1 MG PO TABS
1.0000 mg | ORAL_TABLET | Freq: Three times a day (TID) | ORAL | Status: DC | PRN
  Administered 2011-09-28: 1 mg via ORAL
  Filled 2011-09-28: qty 1

## 2011-09-28 NOTE — ED Notes (Signed)
Pt out to desk. States she wants her discharge papers. She needs to go home before it gets to cold and dark. Pt made aware her treatment was not complete. Pt states we are just going to send her home like we have before

## 2011-09-28 NOTE — ED Notes (Signed)
Per pt's req, given a second dinner tray

## 2011-09-28 NOTE — ED Notes (Signed)
Patient left with [place] [employer]; ambulatory with steady gait. Patient to be transported to [facility].
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
## 2011-09-28 NOTE — ED Notes (Signed)
Patient has been accepted to [facility].

## 2011-09-28 NOTE — ED Notes (Signed)
Pt given call bell and instructed to call for nurse when she needed to get up. Pt agrees

## 2011-09-28 NOTE — ED Notes (Signed)
Patient is resting comfortably. 
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
## 2011-09-28 NOTE — ED Notes (Signed)
Pt was brought in by [facility] with IVC papers from [facility]. Per papers pt is intoxicated and was yelling and cursing at staff. Pt denies SI and HI. Per pt she wants detox and " nerve pills".
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
## 2011-09-29 ENCOUNTER — Encounter (HOSPITAL_COMMUNITY): Payer: Self-pay | Admitting: *Deleted

## 2011-09-29 ENCOUNTER — Inpatient Hospital Stay (HOSPITAL_COMMUNITY)
Admission: AD | Admit: 2011-09-29 | Discharge: 2011-10-02 | DRG: 897 | Disposition: A | Discharge status: Home / Self Care | Payer: Medicare Other | Source: Ambulatory Visit | Attending: Psychiatry | Admitting: Psychiatry

## 2011-09-29 DIAGNOSIS — F609 Personality disorder, unspecified: Secondary | ICD-10-CM

## 2011-09-29 DIAGNOSIS — R51 Headache: Secondary | ICD-10-CM

## 2011-09-29 DIAGNOSIS — Z5900 Homelessness unspecified: Secondary | ICD-10-CM

## 2011-09-29 DIAGNOSIS — F1020 Alcohol dependence, uncomplicated: Principal | ICD-10-CM | POA: Diagnosis present

## 2011-09-29 DIAGNOSIS — F319 Bipolar disorder, unspecified: Secondary | ICD-10-CM

## 2011-09-29 DIAGNOSIS — F1994 Other psychoactive substance use, unspecified with psychoactive substance-induced mood disorder: Secondary | ICD-10-CM

## 2011-09-29 DIAGNOSIS — Z8544 Personal history of malignant neoplasm of other female genital organs: Secondary | ICD-10-CM

## 2011-09-29 DIAGNOSIS — Z79899 Other long term (current) drug therapy: Secondary | ICD-10-CM

## 2011-09-29 DIAGNOSIS — E0590 Thyrotoxicosis, unspecified without thyrotoxic crisis or storm: Secondary | ICD-10-CM

## 2011-09-29 DIAGNOSIS — F17200 Nicotine dependence, unspecified, uncomplicated: Secondary | ICD-10-CM

## 2011-09-29 HISTORY — DX: Mental disorder, not otherwise specified: F99

## 2011-09-29 HISTORY — DX: Bipolar disorder, current episode manic without psychotic features, unspecified: F31.10

## 2011-09-29 HISTORY — DX: Thyrotoxicosis, unspecified without thyrotoxic crisis or storm: E05.90

## 2011-09-29 HISTORY — DX: Headache: R51

## 2011-09-29 HISTORY — DX: Malignant (primary) neoplasm, unspecified: C80.1

## 2011-09-29 MED ORDER — ADULT MULTIVITAMIN W/MINERALS CH
1.0000 | ORAL_TABLET | Freq: Every day | ORAL | Status: DC
  Administered 2011-09-29 – 2011-10-02 (×4): 1 via ORAL
  Filled 2011-09-29 (×5): qty 1

## 2011-09-29 MED ORDER — LEVOTHYROXINE SODIUM 150 MCG PO TABS
150.0000 ug | ORAL_TABLET | Freq: Every day | ORAL | Status: DC
  Filled 2011-09-29 (×3): qty 1

## 2011-09-29 MED ORDER — CHLORDIAZEPOXIDE HCL 25 MG PO CAPS
50.0000 mg | ORAL_CAPSULE | Freq: Once | ORAL | Status: AC
  Administered 2011-09-29: 50 mg via ORAL
  Filled 2011-09-29: qty 2

## 2011-09-29 MED ORDER — MAGNESIUM HYDROXIDE 400 MG/5ML PO SUSP: 30.0000 mL | Freq: Every day | ORAL | Status: DC | PRN

## 2011-09-29 MED ORDER — ALUM & MAG HYDROXIDE-SIMETH 200-200-20 MG/5ML PO SUSP
30.0000 mL | ORAL | Status: DC | PRN
  Administered 2011-09-30: 30 mL via ORAL

## 2011-09-29 MED ORDER — LOPERAMIDE HCL 2 MG PO CAPS: 2.0000 mg | ORAL_CAPSULE | ORAL | Status: AC | PRN

## 2011-09-29 MED ORDER — CHLORDIAZEPOXIDE HCL 25 MG PO CAPS
25.0000 mg | ORAL_CAPSULE | Freq: Four times a day (QID) | ORAL | Status: AC | PRN
  Administered 2011-09-29 – 2011-09-30 (×2): 25 mg via ORAL
  Filled 2011-09-29 (×2): qty 1

## 2011-09-29 MED ORDER — ONDANSETRON 4 MG PO TBDP
4.0000 mg | ORAL_TABLET | Freq: Four times a day (QID) | ORAL | Status: AC | PRN
  Administered 2011-09-30: 4 mg via ORAL
  Filled 2011-09-29: qty 1

## 2011-09-29 MED ORDER — TRAZODONE HCL 50 MG PO TABS
150.0000 mg | ORAL_TABLET | Freq: Every evening | ORAL | Status: DC | PRN
  Administered 2011-09-29 – 2011-09-30 (×2): 150 mg via ORAL
  Filled 2011-09-29 (×2): qty 1

## 2011-09-29 MED ORDER — HYDROXYZINE HCL 25 MG PO TABS
25.0000 mg | ORAL_TABLET | Freq: Four times a day (QID) | ORAL | Status: AC | PRN
  Administered 2011-09-29 – 2011-10-01 (×2): 25 mg via ORAL
  Filled 2011-09-29: qty 1

## 2011-09-29 MED ORDER — THIAMINE HCL 100 MG/ML IJ SOLN
100.0000 mg | Freq: Once | INTRAMUSCULAR | Status: AC
  Administered 2011-09-29: 100 mg via INTRAMUSCULAR

## 2011-09-29 MED ORDER — VITAMIN B-1 100 MG PO TABS
100.0000 mg | ORAL_TABLET | Freq: Every day | ORAL | Status: DC
  Administered 2011-09-30 – 2011-10-02 (×3): 100 mg via ORAL
  Filled 2011-09-29 (×5): qty 1

## 2011-09-29 MED ORDER — INFLUENZA VIRUS VACC SPLIT PF IM SUSP: 0.5000 mL | INTRAMUSCULAR | Status: DC

## 2011-09-29 MED ORDER — NICOTINE 21 MG/24HR TD PT24
21.0000 mg | MEDICATED_PATCH | Freq: Every day | TRANSDERMAL | Status: DC
  Administered 2011-09-29 – 2011-10-02 (×4): 21 mg via TRANSDERMAL
  Filled 2011-09-29 (×6): qty 1

## 2011-09-29 MED ORDER — PAROXETINE HCL 20 MG PO TABS
20.0000 mg | ORAL_TABLET | ORAL | Status: DC
  Filled 2011-09-29 (×3): qty 1

## 2011-09-29 MED ORDER — NICOTINE 21 MG/24HR TD PT24
MEDICATED_PATCH | TRANSDERMAL | Status: AC
  Administered 2011-09-30: 21 mg via TRANSDERMAL
  Filled 2011-09-29: qty 1

## 2011-09-29 MED ORDER — ACETAMINOPHEN 325 MG PO TABS
650.0000 mg | ORAL_TABLET | Freq: Four times a day (QID) | ORAL | Status: DC | PRN
  Administered 2011-09-30 – 2011-10-02 (×4): 650 mg via ORAL

## 2011-09-29 MED ORDER — LEVOTHYROXINE SODIUM 150 MCG PO TABS
150.0000 ug | ORAL_TABLET | Freq: Every day | ORAL | Status: DC
  Administered 2011-09-30 – 2011-10-02 (×3): 150 ug via ORAL
  Filled 2011-09-29 (×2): qty 1
  Filled 2011-09-29: qty 7
  Filled 2011-09-29 (×2): qty 1

## 2011-09-29 MED ORDER — SERTRALINE HCL 50 MG PO TABS
50.0000 mg | ORAL_TABLET | Freq: Every day | ORAL | Status: DC
  Administered 2011-09-30 – 2011-10-02 (×3): 50 mg via ORAL
  Filled 2011-09-29 (×3): qty 1
  Filled 2011-09-29: qty 14

## 2011-09-29 NOTE — Progress Notes (Signed)
Pt has spent the day in her bed asleep. Refused to get up for groups. Did go down for meals. Denies SI and HI. States that she was very tired this morning, but immediately asked for 'something for my nerves'. Was given Vistaril at 0837 this morning. States she does not feel well due to her drinking and her thyroid. Affect is flat and mood depressed. Given support and reassurance. States she just needs to get over feeling so very bad.

## 2011-09-29 NOTE — Progress Notes (Signed)
[facility] Group Notes:  (Counselor/Nursing/MHT/Case Management/Adjunct)  09/29/2011 1315  Type of Therapy:  Group Therapy  Participation Level:  Did Not Attend  Modes of Intervention:  Activity, Clarification, Education, Problem-solving and Socialization   Cynthia Church 09/29/2011, 2:34 PM
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
## 2011-09-29 NOTE — Tx Team (Signed)
Initial Interdisciplinary Treatment Plan  PATIENT STRENGTHS: (choose at least two) Ability for insight Active sense of humor Capable of independent living Communication skills Motivation for treatment/growth Supportive family/friends  PATIENT STRESSORS: Loss of relationship with S.O. (BF).* Medication change or noncompliance Substance abuse Traumatic event   PROBLEM LIST: Problem List/Patient Goals Date to be addressed Date deferred Reason deferred Estimated date of resolution                                                         DISCHARGE CRITERIA:  Ability to meet basic life and health needs Adequate post-discharge living arrangements Improved stabilization in mood, thinking, and/or behavior Motivation to continue treatment in a less acute level of care Need for constant or close observation no longer present Safe-care adequate arrangements made Verbal commitment to aftercare and medication compliance Withdrawal symptoms are absent or subacute and managed without 24-hour nursing intervention  PRELIMINARY DISCHARGE PLAN: Attend aftercare/continuing care group Attend PHP/IOP Attend 12-step recovery group Outpatient therapy Placement in alternative living arrangements  PATIENT/FAMIILY INVOLVEMENT: This treatment plan has been presented to and reviewed with the patient, Cynthia Church. POA for [facility] needs to be contacted: Despina Pole.  The patient and family have been given the opportunity to ask questions and make suggestions.  Pixie Casino Warsaw 09/29/2011, 4:02 AM
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
## 2011-09-29 NOTE — Progress Notes (Signed)
Writer observed patient lying in bed asleep and was easily awakened when Clinical research associate called her name. Writer introduced self to patient as her nurse for the shift. Patient was asked how her day has been and she reported that she has been tired all day and feels bad. Writer informed patient of prn medications available to help with her withdrawals and she reported that she wanted Trazadone and Librium at 2130. Patient currently denies having pain, -si/hi/a/v hall. Safety maintained on unit, will continue to monitor and patient was allowed to continue to rest.

## 2011-09-30 LAB — T4, FREE: Free T4: 0.71 ng/dL — ABNORMAL LOW (ref 0.80–1.80)

## 2011-09-30 LAB — TSH: TSH: 2.5 u[IU]/mL (ref 0.350–4.500)

## 2011-09-30 LAB — T3, FREE: T3, Free: 2 pg/mL — ABNORMAL LOW (ref 2.3–4.2)

## 2011-09-30 MED ORDER — INFLUENZA VIRUS VACC SPLIT PF IM SUSP
0.5000 mL | Freq: Once | INTRAMUSCULAR | Status: AC
  Administered 2011-09-30: 0.5 mL via INTRAMUSCULAR

## 2011-09-30 NOTE — Progress Notes (Signed)
  Cynthia Church is a 46 y.o. female [id number] 1965-09-25  09/29/2011 Principal Problem:  *Alcohol dependence   Mental Status: Seen in room in bed. Alert & oriented.denies SI/HI mood could be better as she doesn't have a place to go to at discharge.   Subjective/Objective: Says her snack made her feel bad.Not sure what she can do at discharge. Enjoys group tolerating meds.     Filed Vitals:   09/30/11 0601  BP: 125/89  Pulse: 118  Temp:   Resp:     Lab Results:   BMET    Component Value Date/Time   NA 135 09/28/2011 1645   K 3.7 09/28/2011 1645   CL 100 09/28/2011 1645   CO2 24 09/28/2011 1645   GLUCOSE 123* 09/28/2011 1645   BUN 6 09/28/2011 1645   CREATININE 0.58 09/28/2011 1645   CALCIUM 9.2 09/28/2011 1645   GFRNONAA >90 09/28/2011 1645   GFRAA >90 09/28/2011 1645    Medications:  Scheduled:     . influenza  inactive virus vaccine  0.5 mL Intramuscular Tomorrow-1000  . levothyroxine  150 mcg Oral Q breakfast  . mulitivitamin with minerals  1 tablet Oral Daily  . nicotine  21 mg Transdermal Q0600  . sertraline  50 mg Oral Daily  . thiamine  100 mg Oral Daily  . DISCONTD: nicotine         PRN Meds acetaminophen, alum & mag hydroxide-simeth, chlordiazePOXIDE, hydrOXYzine, loperamide, magnesium hydroxide, ondansetron, traZODone  Plan: continue with current plan of care.           Case manager to help with discharge placement suggestions.   Nikolaj Geraghty,MICKIE D. 09/30/2011

## 2011-09-30 NOTE — Progress Notes (Signed)
Patient ID: Cynthia Church, female   DOB: [birth year], 46 y.o.   MRN: [id number]   Pt has been appropriate on the unit today. Pt has attended groups and engaged in treatment, no SI/HI noted. Pt continues to have withdrawal symptoms, and has had a CIWA of 5. No other issues or concerns noted.

## 2011-09-30 NOTE — Progress Notes (Signed)
[facility] Adult Inpatient Family/Significant Other Suicide Prevention Education  Suicide Prevention Education:  Education Completed; Florance Waddell-[phone](Friend)  has been identified by the patient as the family member/significant other with whom the patient will be residing, and identified as the person(s) who will aid the patient in the event of a mental health crisis (suicidal ideations/suicide attempt).  With written consent from the patient, the family member/significant other has been provided the following suicide prevention education, prior to the and/or following the discharge of the patient.  The suicide prevention education provided includes the following:  Suicide risk factors  Suicide prevention and interventions  National Suicide Hotline telephone number  [facility] assessment telephone number  [facility] Emergency Assistance 911  [facility] and/or Residential Mobile Crisis Unit telephone number  Request made of family/significant other to:  Remove weapons (e.g., guns, rifles, knives), all items previously/currently identified as safety concern.  Pt.'s friend stated there is no alcohol, or weapons in the home and that she will secure the home before the pt. Is discharged.  Remove drugs/medications (over-the-counter, prescriptions, illicit drugs), all items previously/currently identified as a safety concern. Pt.'s friend will secure the home. Had no concerns. Pt. had been living with her friend since Sunday of last week and states the pt. plans to return to live there after discharge. Pt.'s friend states she wants the pt. to get the help she needs.   The family member/significant other verbalizes understanding of the suicide prevention education information provided.  The family member/significant other agrees to remove the items of safety concern listed above.  Lamar Blinks Christine 09/30/2011, 8:11 AM

## 2011-09-30 NOTE — Progress Notes (Signed)
[facility] Group Notes:  (Counselor/Nursing/MHT/Case Management/Adjunct)  09/30/2011 1315  Type of Therapy:  Group Therapy  Participation Level:  Did Not Attend  Modes of Intervention:  Activity, Clarification, Problem-solving and Support   Verina Galeno 09/30/2011, 3:03 PM

## 2011-09-30 NOTE — Progress Notes (Signed)
Pt signed in voluntarily today. Affect is brighter, mood less depressed. Attending the groups, interacting with her peers. Eye contact improved. States that she is glad that she is here and wants to help herself not to drink anymore. Less focused on her physical and more focused on her feelings and feeling better. Given support and praise. Denies SI and HI.

## 2011-09-30 NOTE — Progress Notes (Signed)
Patient was seen by writer at the medication window requesting medication for gas. Patient reports that she had vomited earlier today a nd writer explained to patient the process and some of the symptoms she might experience while detoxing. Patient was given maalox for gas and gingerale to sip on. Patient currently denies having pain, -si/hi/a/v hall. Safety maintained, patient supported and encouraged, will continue to monitor.

## 2011-10-01 DIAGNOSIS — F1020 Alcohol dependence, uncomplicated: Principal | ICD-10-CM

## 2011-10-01 NOTE — Progress Notes (Signed)
Pt. States she felt sick yesterday after getting the flu shot. Denies feeling si or HI but states, I am just tired. C/o slight headache and body aches and given 2 tyelnol by nurse Meriam Sprague. Pt. Has good eye contact and contracts for safety. Pt encouraged to drink po.l

## 2011-10-01 NOTE — Progress Notes (Signed)
[facility] MD Progress Note  10/01/2011 8:47 AM  Diagnosis:  Axis I: Alcohol Abuse  ADL's:  Intact  Sleep: Good  Appetite:  Good  Suicidal Ideation:  Denies adamantly any suicidal thoughts. Homicidal Ideation:  Denies adamantly any homicidal thoughts.  Mental Status Examination/Evaluation: Objective:  Appearance: Casual donning a shirt bearing "Hello, I guess"  Eye Contact::  Fair  Speech:  Clear and Coherent  Volume:  Normal  Mood:  5 /10 on a scale of 1 is the best and 10 is the worst  Anxious: 5 /10 on the same scale.  Affect:  Congruent  Thought Process:  Coherent  Orientation:  Full  Thought Content:  WDL  Suicidal Thoughts:  No  Homicidal Thoughts:  No  Memory:  Immediate;   Poor  Judgement:  Impaired  Insight:  Lacking  Psychomotor Activity:  Normal  Concentration:  Poor  Recall:  Poor  Akathisia:  No  Handed:  Right  AIMS (if indicated):     Assets:  Communication Skills  Sleep:  Number of Hours: 3    Vital Signs:Blood pressure 109/78, pulse 103, temperature 97.4 F (36.3 C), temperature source Oral, resp. rate 18, height 5\' 1"  (1.549 m), weight 53.524 kg (118 lb). Current Medications: Current Facility-Administered Medications  Medication Dose Route Frequency Provider Last Rate Last Dose  . acetaminophen (TYLENOL) tablet 650 mg  650 mg Oral Q6H PRN Franchot Gallo, MD   650 mg at 09/30/11 0734  . alum & mag hydroxide-simeth (MAALOX/MYLANTA) 200-200-20 MG/5ML suspension 30 mL  30 mL Oral Q4H PRN Franchot Gallo, MD   30 mL at 09/30/11 2004  . chlordiazePOXIDE (LIBRIUM) capsule 25 mg  25 mg Oral Q6H PRN Franchot Gallo, MD   25 mg at 09/30/11 2145  . hydrOXYzine (ATARAX/VISTARIL) tablet 25 mg  25 mg Oral Q6H PRN Franchot Gallo, MD   25 mg at 10/01/11 0147  . influenza  inactive virus vaccine (FLUZONE/FLUARIX) injection 0.5 mL  0.5 mL Intramuscular Once Orson Aloe, MD   0.5 mL at 09/30/11 1546  . levothyroxine (SYNTHROID, LEVOTHROID) tablet 150 mcg  150 mcg Oral Q  breakfast Franchot Gallo, MD   150 mcg at 10/01/11 0828  . loperamide (IMODIUM) capsule 2-4 mg  2-4 mg Oral PRN Franchot Gallo, MD      . magnesium hydroxide (MILK OF MAGNESIA) suspension 30 mL  30 mL Oral Daily PRN Franchot Gallo, MD      . mulitivitamin with minerals tablet 1 tablet  1 tablet Oral Daily Franchot Gallo, MD   1 tablet at 10/01/11 0828  . nicotine (NICODERM CQ - dosed in mg/24 hours) patch 21 mg  21 mg Transdermal Q0600 Orson Aloe, MD   21 mg at 10/01/11 1610  . ondansetron (ZOFRAN-ODT) disintegrating tablet 4 mg  4 mg Oral Q6H PRN Franchot Gallo, MD   4 mg at 09/30/11 2144  . sertraline (ZOLOFT) tablet 50 mg  50 mg Oral Daily Franchot Gallo, MD   50 mg at 10/01/11 0828  . thiamine (VITAMIN B-1) tablet 100 mg  100 mg Oral Daily Franchot Gallo, MD   100 mg at 10/01/11 9604  . traZODone (DESYREL) tablet 150 mg  150 mg Oral QHS PRN Franchot Gallo, MD   150 mg at 09/30/11 2144  . DISCONTD: influenza  inactive virus vaccine (FLUZONE/FLUARIX) injection 0.5 mL  0.5 mL Intramuscular Tomorrow-1000 Franchot Gallo, MD        Lab Results:  Results for orders placed during the hospital encounter of 09/29/11 (  from the past 48 hour(s))  TSH     Status: Normal   Collection Time   09/29/11  7:48 PM      Component Value Range Comment   TSH 2.500  0.350 - 4.500 (uIU/mL)   T3, FREE     Status: Abnormal   Collection Time   09/29/11  7:48 PM      Component Value Range Comment   T3, Free 2.0 (*) 2.3 - 4.2 (pg/mL)   T4, FREE     Status: Abnormal   Collection Time   09/29/11  7:48 PM      Component Value Range Comment   Free T4 0.71 (*) 0.80 - 1.80 (ng/dL)     Physical Findings: AIMS:  , ,  ,  ,    CIWA:  CIWA-Ar Total: 2  COWS:  COWS Total Score: 4   Treatment Plan Summary: Daily contact with patient to assess and evaluate symptoms and progress in treatment Medication management  Plan: Continue detox. Will refer to [address] for programming.  Cynthia Church 10/01/2011, 8:47 AM

## 2011-10-01 NOTE — Progress Notes (Signed)
[facility] Group Notes:  (Counselor/Nursing/MHT/Case Management/Adjunct) 1:15pm   Type of Therapy:  Group Therapy  Participation Level:  Did Not Attend       Billie Lade 10/01/2011  2:07 PM

## 2011-10-01 NOTE — Progress Notes (Signed)
[facility] Group Notes:  (Counselor/Nursing/MHT/Case Management/Adjunct)    Type of Therapy:  Group Therapy  Participation Level:  Did Not Attend       Billie Lade 10/01/2011  1:09 PM

## 2011-10-01 NOTE — Progress Notes (Signed)
SW met with pt individually on this date.  Pt was open with sharing reason for entering the hospital.  Pt states that she came here to detox off of alcohol and get back on her medications for anxiety.  Pt states that isn't feeling well today, reporting being sore from a shot and a congestion from a cold.  Pt states that she has been to [facility] before and would like a referral back to there.  SW will refer pt to [facility] for medication management and therapy.  Pt will return to own home and has transportation home.  Pt reports mild anxiety, and denies depression and SI.  No further needs at this time.   Lauralyn Primes 10/01/2011  3:18 PM    Per State Regulation 482.30 This Chart was reviewed for medical necessity with respect to the patient's Admission/Duration of stay.   Carmina Miller  10/01/2011  Next Review Date:  10/03/11

## 2011-10-02 DIAGNOSIS — F1994 Other psychoactive substance use, unspecified with psychoactive substance-induced mood disorder: Secondary | ICD-10-CM

## 2011-10-02 MED ORDER — SERTRALINE HCL 50 MG PO TABS: 50.0000 mg | ORAL_TABLET | Freq: Every day | ORAL | Status: DC

## 2011-10-02 MED ORDER — LEVOTHYROXINE SODIUM 150 MCG PO TABS: 150.0000 ug | ORAL_TABLET | Freq: Every day | ORAL | Status: DC

## 2011-10-02 MED ORDER — TRAZODONE HCL 50 MG PO TABS
150.0000 mg | ORAL_TABLET | Freq: Every day | ORAL | Status: DC
  Filled 2011-10-02: qty 42

## 2011-10-02 MED ORDER — TRAZODONE HCL 150 MG PO TABS: 150.0000 mg | ORAL_TABLET | Freq: Every evening | ORAL | Status: DC | PRN

## 2011-10-02 NOTE — Discharge Summary (Signed)
Physician Discharge Summary Note  Patient:  Cynthia Church is an 46 y.o., female MRN:  [id number] DOB:  1966/08/12 Patient phone:  [phone] (home)  Patient address:   [address] [place] [address],   Date of Admission:  09/29/2011 Date of Discharge: 10/02/11  Reason for Admission: Alcohol detoxification  Discharge Diagnoses: Principal Problem:  *Alcohol dependence   Axis Diagnosis:   AXIS I:  Substance Induced Mood Disorder, Alcohol dependency AXIS II:  Deferred AXIS III:   Past Medical History  Diagnosis Date  . Cancer 1989  . Mental disorder   . Bipolar affective disorder, manic 1993    Dx'd at [facility]  . Personality disorders 1993  . Hyperthyroidism 1993  . Headache 09/25/2011   AXIS IV:  economic problems, housing problems, occupational problems, other psychosocial or environmental problems, problems related to social environment and problems with primary support group AXIS V:  70  Level of Care:  OP  Hospital Course:  Presents for medically supported alcohol detox on IVC from [facility]. ETOH 248 and when intoxicated becomes agitated and unstable. May be homeless as well.  Numerous prior admissions is a chronic alcohol abuser And also reports having been admitted to [facility] 10-15 years ago. While a patient in this hospital, patient was started on librium protocol for alcohol detox. She also received medication management for depressive mood and other medical conditions. Patient on daily basis reports gradual decrease in withdrawal symptoms and improved mood. She was encouraged to attend the AA meetings being held in this hospital as well as group counseling. On this day of discharge, patient attended treatment team meeting and agreed with treatment team members that she is stable for a home discharge. She reports, "I am a lot better. My mood is good.One of the lessons that I learned here is that people do care. I learn to love my self or no body else will  care. She was encouraged to stay away from alcohol and break away from her drinking buddies because they may be the reason why her drinking problems lingers. She was recommended to continue AA meetings and join [facility]. Patient will continue psychiatric care on an outpatient basis at [facility] in [facility]. She is provided with 2 weeks worth samples of her discharged medications. Patient left [facility] facility with all personal belongings via family transport in no apparent distress.   Consults:  None  Significant Diagnostic Studies:  None  Discharge Vitals:   Blood pressure 128/90, pulse 96, temperature 97.5 F (36.4 C), temperature source Oral, resp. rate 17, height 5\' 1"  (1.549 m), weight 53.524 kg (118 lb).  Mental Status Exam: See Mental Status Examination and Suicide Risk Assessment completed by Attending Physician prior to discharge.  Discharge destination:  Home  Is patient on multiple antipsychotic therapies at discharge:  No   Has Patient had three or more failed trials of antipsychotic monotherapy by history:  No  Recommended Plan for Multiple Antipsychotic Therapies: NA   Medication List  As of 10/02/2011  2:58 PM   STOP taking these medications         PARoxetine 20 MG tablet         TAKE these medications      Indication    levothyroxine 150 MCG tablet   Commonly known as: SYNTHROID, LEVOTHROID   Take 1 tablet (150 mcg total) by mouth daily. For thyroid hormone replacement.       sertraline 50 MG tablet   Commonly known  as: ZOLOFT   Take 1 tablet (50 mg total) by mouth daily. For depression       traZODone 150 MG tablet   Commonly known as: DESYREL   Take 1 tablet (150 mg total) by mouth at bedtime as needed. For sleep            Follow-up Information    Follow up with Arna Medici  on 10/04/2011. (Appointment scheduled at 8:00 am Auth # [id number])    Contact information:   [address] [place], [place] [address] [phone]          Follow-up recommendations:  Other:  Keep your scheduled followed up appointments as recommended.  Comments:  Take all medications as prescribed.                       Report promptly any adverse effects from medications to your outpatient provider.  SignedArmandina Stammer I 10/02/2011, 2:58 PM

## 2011-10-02 NOTE — Progress Notes (Signed)
Pt states she feels better this morning. Pt does not sound as congested, going to groups, denies SI/HI and has a plan to go back to [facility] after discharge. Pt can be loud and intrusive at times, but overall cooperative. Pt's goal is to "stay on meds, away from alcohol, change friends and church."

## 2011-10-02 NOTE — Progress Notes (Signed)
Pt was discharged home today. She denied any S/I H/I or A/V hallucinations.  She was given f/u appointment, rx, sample medications, and hotline info booklet.  She voiced understanding to all instructions provided.  She declined the need for smoking cessation materials.  She removed her nicotine patch before she left. 

## 2011-10-02 NOTE — Progress Notes (Signed)
Patient is to be discharged today.  Reviewed all discharge instructions, medications, and follow up care.  Patient verbalized understanding of all.  Two week supply of medications given to patient from the hospital pharmacy.  Patient denies suicidal ideation or depressive symptoms at this time.  Patient is ready for discharge and has her paperwork.  Her medications are at the desk until her ride arrives.

## 2011-10-02 NOTE — Progress Notes (Signed)
[facility] Case Management Discharge Plan:  Will you be returning to the same living situation after discharge: Yes,  pt to return home At discharge, do you have transportation home?:Yes,  pt has transportation home Do you have the ability to pay for your medications:Yes,  access to meds  Release of information consent forms completed and in the chart;  Patient's signature needed at discharge.  Patient to Follow up at:  Follow-up Information    Follow up with Arna Medici  on 10/04/2011. (Appointment scheduled at 8:00 am Auth # [id number])    Contact information:   [address] [place], [place] [address] [phone]         Patient denies SI/HI:   Yes,  pt denies SI/HI    Safety Planning and Suicide Prevention discussed:  Yes,  discussed with pt  Barrier to discharge identified:No.  Summary and Recommendations: Pt reports feeling stable to d/c today.  Pt denies depression and anxiety today.  Pt denies SI/HI.  No recommendations from SW.  No further needs voiced by pt.  Pt stable to discharge.     Carmina Miller 10/02/2011, 10:39 AM

## 2011-10-02 NOTE — Progress Notes (Signed)
Grief and Loss Group  Facilitated grief and loss group on [address]. Discussed group rules and confidentiality/privacy. Discussed various ways in which one may experience loss and recognizing the grief reactions one may have in response. This group mainly focused on loss of identity related to mental health dx and loss of freedom and self-esteem as a result of interaction w/ mental health system. Group identified grief reactions (re: anger, frustration, sadness, desperation, feeling overwhelmed) as a result of these losses.  The group was interactive with open sharing and universality.  Pt was engaged in the group, shared her struggle w/ ETOH. Pt stated that she felt frustrated w/ power of attorney, felt like she was trying to seek help was is always being pushed to do more. Pt recognized how ETOH had prompted loss in her life (e.g., multiple divorces, family conflict) and how these losses impact mood. Pt related to other group members who also reported feelings of frustration.  Maegan Buller B MS, LPCA, NCC

## 2011-10-02 NOTE — Progress Notes (Signed)
Patient ID: Cynthia Church, female   DOB: 10/20/1965, 46 y.o.   MRN: [id number]  Pt continues to complain of congestion. Stated that before she received her pneumonia vaccine, she was going thru bad withdrawals. Support and encouragement was offered.

## 2011-10-02 NOTE — Tx Team (Signed)
Interdisciplinary Treatment Plan Update (Adult)  Date:  10/02/2011  Time Reviewed:  10:36 AM   Progress in Treatment: Attending groups: Yes Participating in groups:  Yes Taking medication as prescribed: Yes Tolerating medication:  Yes Family/Significant othe contact made:  Yes Patient understands diagnosis:  Yes Discussing patient identified problems/goals with staff:  Yes Medical problems stabilized or resolved:  Yes Denies suicidal/homicidal ideation: Yes Issues/concerns per patient self-inventory:  None identified Other: N/A  New problem(s) identified: None Identified  Reason for Continuation of Hospitalization: Stable to d/c  Interventions implemented related to continuation of hospitalization: Stable to d/c  Additional comments: N/A  Estimated length of stay: D/C today  Discharge Plan: Pt will follow up at [facility] for medication management and therapy.    New goal(s): N/A  Review of initial/current patient goals per problem list:    1.  Goal(s): Reduce depressive symptoms  Met:  Yes  Target date: by discharge  As evidenced by: Reducing depression from a 10 to a 3 as reported by pt. Pt denies depression.   2.  Goal (s): Reduce/Eliminate suicidal ideation  Met:  Yes  Target date: by discharge  As evidenced by: Pt denies SI.    3.  Goal(s): Reduce anxiety symptoms  Met:  Yes  Target date: by discharge  As evidenced by: Reduce anxiety from a 10 to a 3 as reported by pt.  Pt denies anxiety.     Attendees: Patient:  Cynthia Church 10/02/2011 10:39 AM   Family:     Physician:  Orson Aloe, MD  10/02/2011  10:36 AM   Nursing:   Quintella Reichert, RN 10/02/2011 10:39 AM   Case Manager:  Reyes Ivan, LCSWA 10/02/2011  10:36 AM   Counselor:  Marni Griffon, LCAS 10/02/2011  10:36 AM   Other:  Juline Patch, LCSW 10/02/2011  10:36 AM   Other:  Serena Colonel, NP 10/02/2011  10:36 AM   Other:     Other:      Scribe for Treatment Team:   Carmina Miller, 10/02/2011 , 10:36 AM

## 2011-10-02 NOTE — [facility] Suicide Risk Assessment (Signed)
Suicide Risk Assessment  Discharge Assessment     Demographic factors:  Assessment Details Time of Assessment: Admission Information Obtained From: Patient Current Mental Status:  Current Mental Status:  (Denies lethality) Risk Reduction Factors:  Risk Reduction Factors: Positive social support;Positive therapeutic relationship  CLINICAL FACTORS:   Severe Anxiety and/or Agitation Depression:   Anhedonia Comorbid alcohol abuse/dependence Hopelessness Alcohol/Substance Abuse/Dependencies Previous Psychiatric Diagnoses and Treatments Medical Diagnoses and Treatments/Surgeries  COGNITIVE FEATURES THAT CONTRIBUTE TO RISK:  Closed-mindedness Loss of executive function Polarized thinking Thought constriction (tunnel vision)    SUICIDE RISK:   Minimal: No identifiable suicidal ideation.  Patients presenting with no risk factors but with morbid ruminations; may be classified as minimal risk based on the severity of the depressive symptoms  ADL's:  Intact  Sleep: Good  Appetite:  Good  Suicidal Ideation:  Denies adamantly any suicidal thoughts. Homicidal Ideation:  Denies adamantly any homicidal thoughts.  Mental Status Examination/Evaluation: Objective:  Appearance: Casual  Eye Contact::  Good  Speech:  Clear and Coherent  Volume:  Normal  Mood:  Euthymic  Affect:  Congruent  Thought Process:  Coherent  Orientation:  Full  Thought Content:  WDL  Suicidal Thoughts:  No  Homicidal Thoughts:  No  Memory:  Immediate;   Good  Judgement:  Good  Insight:  Good  Psychomotor Activity:  Normal  Concentration:  Good  Recall:  Good  Akathisia:  No  AIMS (if indicated):     Assets:  Communication Skills Desire for Improvement Intimacy Leisure Time Social Support  Sleep: Number of Hours: 3    Vital Signs: Blood pressure 128/90, pulse 96, temperature 97.5 F (36.4 C), temperature source Oral, resp. rate 17, height 5\' 1"  (1.549 m), weight 53.524 kg (118 lb).  Labs No  results found for this or any previous visit (from the past 48 hour(s)).  What pt has learned from hospital stay is that there are people who care and that you have to take care of yourself and that you have to ignore the negative things that others say to you  Risk of self harm is elevated by her history of substance abuse and depression, but she now has to live for herself and to change her playgrounds and playmates  Risk of harm to others is minimal in that she has not been involved in fights or had any legal charges filed on her.  PLAN: Discharge home Continue Medication List  As of 10/02/2011 12:36 PM   STOP taking these medications         PARoxetine 20 MG tablet         TAKE these medications         levothyroxine 150 MCG tablet   Commonly known as: SYNTHROID, LEVOTHROID   Take 1 tablet (150 mcg total) by mouth daily. For thyroid hormone replacement.      sertraline 50 MG tablet   Commonly known as: ZOLOFT   Take 1 tablet (50 mg total) by mouth daily. For depression      traZODone 150 MG tablet   Commonly known as: DESYREL   Take 1 tablet (150 mg total) by mouth at bedtime as needed. For sleep            Cynthia Church 10/02/2011, 12:35 PM

## 2011-10-08 NOTE — Progress Notes (Signed)
Patient Discharge Instructions:  Admission Note Faxed,  10/05/2011 After Visit Summary Faxed,  10/05/2011 Faxed to the [facility] Care provider:  10/05/2011 D/C Summary Note faxed 10/05/2011 Facesheet faxed 10/05/2011  Faxed to [facility] @ [phone]  Heloise Purpura, Eduard Clos, 10/08/2011, 1:06 PM

## 2011-10-10 NOTE — Discharge Summary (Signed)
I agree with this D/C Summary.  

## 2012-01-31 ENCOUNTER — Encounter (HOSPITAL_COMMUNITY): Payer: Self-pay

## 2012-01-31 ENCOUNTER — Emergency Department (HOSPITAL_COMMUNITY)
Admission: EM | Admit: 2012-01-31 | Discharge: 2012-01-31 | Discharge status: Against Medical Advice | Payer: Medicare Other | Attending: Emergency Medicine | Admitting: Emergency Medicine

## 2012-01-31 DIAGNOSIS — R0602 Shortness of breath: Secondary | ICD-10-CM | POA: Insufficient documentation

## 2012-01-31 DIAGNOSIS — R42 Dizziness and giddiness: Secondary | ICD-10-CM | POA: Insufficient documentation

## 2012-01-31 HISTORY — DX: Other chronic pain: G89.29

## 2012-01-31 NOTE — ED Notes (Signed)
During triage pt began cursing and yelling at staff that she had been "waiting too long" and wanted a morphine drip fucking now", she was " tired of all this fucking non-sense and was going some where else", rn attempted to calm pt and explain delay, pt stood from bed and stated she was leaving and yelled "fuck all of yall" and walked out unaided.  Family member at bedside and walked out w/ pt.  Refused to sign any ama forms, advised to return if she changed her mind.

## 2012-01-31 NOTE — ED Notes (Signed)
1. Pt stated she is sob x4 months and has not been to pmd   2. Ab pain for 3 months and "hurts all the time", thinks she may be pregnant and wants to be checked.  Denies any vaginal discharge, "my pee-pee is dark" 3. Dizzy for 45 min pta STRONG odor of etoh on pt's breath at arrival-when asked she stated that "i drink to ease the pain"

## 2012-04-06 ENCOUNTER — Encounter (HOSPITAL_COMMUNITY): Payer: Self-pay

## 2012-04-06 ENCOUNTER — Emergency Department (HOSPITAL_COMMUNITY)
Admission: EM | Admit: 2012-04-06 | Discharge: 2012-04-06 | Discharge status: Against Medical Advice | Payer: Medicare Other | Attending: Emergency Medicine | Admitting: Emergency Medicine

## 2012-04-06 DIAGNOSIS — R42 Dizziness and giddiness: Secondary | ICD-10-CM | POA: Insufficient documentation

## 2012-04-06 NOTE — ED Notes (Signed)
Pt got up to the use the restroom and then decided to leave before seeing the doctor.

## 2012-04-06 NOTE — ED Notes (Signed)
Pt arrived by ems, from home, stated she has been drinking w/ boyfriend this am, then walked several miles, now dizzy and thinks she is dehydrated.

## 2012-06-02 ENCOUNTER — Emergency Department (HOSPITAL_COMMUNITY): Payer: Medicare Other

## 2012-06-02 ENCOUNTER — Emergency Department (HOSPITAL_COMMUNITY)
Admission: EM | Admit: 2012-06-02 | Discharge: 2012-06-02 | Disposition: A | Discharge status: Home / Self Care | Payer: Medicare Other | Attending: Emergency Medicine | Admitting: Emergency Medicine

## 2012-06-02 ENCOUNTER — Encounter (HOSPITAL_COMMUNITY): Payer: Self-pay

## 2012-06-02 DIAGNOSIS — F3110 Bipolar disorder, current episode manic without psychotic features, unspecified: Secondary | ICD-10-CM | POA: Insufficient documentation

## 2012-06-02 DIAGNOSIS — F10929 Alcohol use, unspecified with intoxication, unspecified: Secondary | ICD-10-CM

## 2012-06-02 DIAGNOSIS — E0590 Thyrotoxicosis, unspecified without thyrotoxic crisis or storm: Secondary | ICD-10-CM | POA: Insufficient documentation

## 2012-06-02 DIAGNOSIS — R51 Headache: Secondary | ICD-10-CM | POA: Insufficient documentation

## 2012-06-02 DIAGNOSIS — M25579 Pain in unspecified ankle and joints of unspecified foot: Secondary | ICD-10-CM

## 2012-06-02 DIAGNOSIS — J441 Chronic obstructive pulmonary disease with (acute) exacerbation: Secondary | ICD-10-CM

## 2012-06-02 DIAGNOSIS — Z8659 Personal history of other mental and behavioral disorders: Secondary | ICD-10-CM | POA: Insufficient documentation

## 2012-06-02 DIAGNOSIS — F1010 Alcohol abuse, uncomplicated: Secondary | ICD-10-CM | POA: Insufficient documentation

## 2012-06-02 DIAGNOSIS — Z79899 Other long term (current) drug therapy: Secondary | ICD-10-CM | POA: Insufficient documentation

## 2012-06-02 DIAGNOSIS — M25559 Pain in unspecified hip: Secondary | ICD-10-CM | POA: Insufficient documentation

## 2012-06-02 DIAGNOSIS — Z859 Personal history of malignant neoplasm, unspecified: Secondary | ICD-10-CM | POA: Insufficient documentation

## 2012-06-02 DIAGNOSIS — F17200 Nicotine dependence, unspecified, uncomplicated: Secondary | ICD-10-CM | POA: Insufficient documentation

## 2012-06-02 LAB — CBC
HCT: 43.9 % (ref 36.0–46.0)
Hemoglobin: 15.9 g/dL — ABNORMAL HIGH (ref 12.0–15.0)
MCHC: 36.2 g/dL — ABNORMAL HIGH (ref 30.0–36.0)
RBC: 4.3 MIL/uL (ref 3.87–5.11)
WBC: 6.6 10*3/uL (ref 4.0–10.5)

## 2012-06-02 LAB — BASIC METABOLIC PANEL
Chloride: 99 mEq/L (ref 96–112)
GFR calc Af Amer: >90 mL/min (ref >90)
GFR calc non Af Amer: >90 mL/min (ref >90)
Potassium: 4 mEq/L (ref 3.5–5.1)
Sodium: 135 mEq/L (ref 135–145)

## 2012-06-02 LAB — HEPATIC FUNCTION PANEL
AST: 105 U/L — ABNORMAL HIGH (ref 0–37)
Albumin: 4.3 g/dL (ref 3.5–5.2)
Alkaline Phosphatase: 73 U/L (ref 39–117)
Total Bilirubin: 0.4 mg/dL (ref 0.3–1.2)
Total Protein: 8.2 g/dL (ref 6.0–8.3)

## 2012-06-02 MED ORDER — AZITHROMYCIN 250 MG PO TABS: 250.0000 mg | ORAL_TABLET | Freq: Every day | ORAL | Status: DC

## 2012-06-02 MED ORDER — THIAMINE HCL 100 MG/ML IJ SOLN
INTRAMUSCULAR | Status: AC
  Filled 2012-06-02: qty 2

## 2012-06-02 MED ORDER — ALBUTEROL SULFATE (5 MG/ML) 0.5% IN NEBU
5.0000 mg | INHALATION_SOLUTION | Freq: Once | RESPIRATORY_TRACT | Status: AC
  Administered 2012-06-02: 5 mg via RESPIRATORY_TRACT
  Filled 2012-06-02: qty 1

## 2012-06-02 MED ORDER — PREDNISONE 20 MG PO TABS
50.0000 mg | ORAL_TABLET | Freq: Once | ORAL | Status: AC
  Administered 2012-06-02: 50 mg via ORAL
  Filled 2012-06-02: qty 2

## 2012-06-02 MED ORDER — THIAMINE HCL 100 MG/ML IJ SOLN
Freq: Once | INTRAVENOUS | Status: AC
  Administered 2012-06-02: 20:00:00 via INTRAVENOUS
  Filled 2012-06-02: qty 1000

## 2012-06-02 MED ORDER — ALBUTEROL SULFATE HFA 108 (90 BASE) MCG/ACT IN AERS
2.0000 | INHALATION_SPRAY | RESPIRATORY_TRACT | Status: DC | PRN
  Administered 2012-06-02: 2 via RESPIRATORY_TRACT
  Filled 2012-06-02: qty 6.7

## 2012-06-02 MED ORDER — FOLIC ACID 5 MG/ML IJ SOLN
INTRAMUSCULAR | Status: AC
  Filled 2012-06-02: qty 0.2

## 2012-06-02 MED ORDER — PREDNISONE 50 MG PO TABS: 50.0000 mg | ORAL_TABLET | Freq: Every day | ORAL | Status: DC

## 2012-06-02 MED ORDER — SODIUM CHLORIDE 0.9 % IV BOLUS (SEPSIS)
1000.0000 mL | Freq: Once | INTRAVENOUS | Status: AC
  Administered 2012-06-02: 1000 mL via INTRAVENOUS

## 2012-06-02 MED ORDER — IPRATROPIUM BROMIDE 0.02 % IN SOLN
0.5000 mg | Freq: Once | RESPIRATORY_TRACT | Status: AC
  Administered 2012-06-02: 0.5 mg via RESPIRATORY_TRACT
  Filled 2012-06-02: qty 2.5

## 2012-06-02 MED ORDER — M.V.I. ADULT IV INJ
INJECTION | INTRAVENOUS | Status: AC
  Filled 2012-06-02: qty 10

## 2012-06-02 NOTE — ED Notes (Signed)
Increased infusion rate of IVF per MD verbal order.  Pt to be discharged once fluid infused.

## 2012-06-02 NOTE — ED Notes (Signed)
Pt reporting continued dizziness, as well as mild nausea.  Pt requesting something to eat and drink.

## 2012-06-02 NOTE — Progress Notes (Signed)
Patient c/o of feeling sick after her nebulizer and stated if it was going to make her feel like that she didn't want another treatment.

## 2012-06-02 NOTE — ED Provider Notes (Signed)
History   This chart was scribed for Jones Skene, MD by Gerlean Ren. This patient was seen in room APA12/APA12 and the patient's care was started at 18:38.   CSN: [id number]  Arrival date & time 06/02/12  1191   First MD Initiated Contact with Patient 06/02/12 1826      Chief Complaint  Patient presents with  . Alcohol Intoxication    (Consider location/radiation/quality/duration/timing/severity/associated sxs/prior treatment) HPI Cynthia Church is a 46 y.o. female brought in by ambulance to the Emergency Department complaining of left ankle, knee, hip pain and non-radiating HA all with gradual onset this morning.  Pt is unsure if she fell or not due to excessive alcohol use.  Pt is currently able to ambulate.  Pt reports drinking 4 cans of beer and smoking 3 packs of cigarettes today.  Pt also reports occasional marijuana use.  Pt reports she is currently going through menopause.  Pt has h/o bipolar affective disorder, personality disorders.  Pt reports having 24 cans of beer and 2 packs of cigarettes daily.    Past Medical History  Diagnosis Date  . Cancer 1989  . Mental disorder   . Bipolar affective disorder, manic 1993    Dx'd at [facility]  . Personality disorders 1993  . Hyperthyroidism 1993  . Headache 09/25/2011  . Chronic pain     Past Surgical History  Procedure Date  . Back surgery   . Cauterization of uterine cancer 2008  . Breast surgery 2003    to check for possible cancer    Family History  Problem Relation Age of Onset  . Cirrhosis Father     History  Substance Use Topics  . Smoking status: Current Every Day Smoker -- 2.0 packs/day for 28 years  . Smokeless tobacco: Not on file  . Alcohol Use: 98.4 oz/week    164 Cans of beer per week    No OB history provided.  Review of Systems REVIEW OF SYSTEMS:   1.) CONSTITUTIONAL: No fever, chills or systemic signs of infection. No recent, unexplained weight changes.   2.) HEENT: No facial pain,  sinus congestion or rhinorrhea is reported. Patient is denying any acute visual or hearing deficits. No sore throat or difficulty swallowing.  3.) NECK: No swelling or masses are reported.   4.) PULMONARY: No cough sputum production or shortness of breath was reported.   5.) CARDIAC: No palpitations, chest pain or pressure.   6.) ABDOMINAL: Denies abdominal pain, nausea, vomiting or diarrhea. No Hematochezia or melena.  7.) GENITOURINARY: No burning with urination or frequency. No discharge.  8.) BACK: Denying any flank or CVA tenderness. No specific thoracic or lumbar pain.   9.) EXTREMITIES: Denying any extremity edema pitting or rash.    10.) NEUROLOGIC: Denying any focal or lateralizing neurologic impairments.     11.) SKIN: No rashes, itching  12.) HEME/LYMPH: No easy bruising/bleeding, no lymphadenopathy  Allergies  Review of patient's allergies indicates no known allergies.  Home Medications   Current Outpatient Rx  Name Route Sig Dispense Refill  . LEVOTHYROXINE SODIUM 150 MCG PO TABS Oral Take 1 tablet (150 mcg total) by mouth daily. For thyroid hormone replacement. 30 tablet 0  . SERTRALINE HCL 50 MG PO TABS Oral Take 1 tablet (50 mg total) by mouth daily. For depression 30 tablet 0  . TRAZODONE HCL 150 MG PO TABS Oral Take 1 tablet (150 mg total) by mouth at bedtime as needed. For sleep 30 tablet 0  BP 121/85  Pulse 91  Temp 98.3 F (36.8 C) (Oral)  Resp 18  SpO2 97%  Physical Exam  Nursing notes reviewed.  Electronic medical record reviewed. VITAL SIGNS:   Filed Vitals:   06/02/12 1820  BP: 121/85  Pulse: 91  Temp: 98.3 F (36.8 C)  TempSrc: Oral  Resp: 18  SpO2: 97%   CONSTITUTIONAL: Awake, oriented, appears non-toxic HENT: Atraumatic, normocephalic, oral mucosa pink and moist, airway patent. Nares patent without drainage. External ears normal. EYES: Conjunctiva clear, EOMI, PERRLA NECK: Trachea midline, non-tender, supple CARDIOVASCULAR:  Normal heart rate, Normal rhythm, No murmurs, rubs, gallops PULMONARY/CHEST: Clear to auscultation, no rhonchi, wheezes, or rales. Symmetrical breath sounds.  Tender to palpation from 10th to 12th ribs on posterior aspect of left side.   MUSCULOSKELETAL: Left ankle has no obvious deformities.  No tenderness to palpation at base of 5th metatarsal, malleolus, and proximal fibula. ABDOMINAL: Non-distended, soft, non-tender - no rebound or guarding.  BS normal. NEUROLOGIC: Non-focal, moving all four extremities, no gross sensory or motor deficits. EXTREMITIES: No clubbing, cyanosis, or edema SKIN: Warm, Dry, No erythema, No rash  ED Course  Procedures (including critical care time) DIAGNOSTIC STUDIES: Oxygen Saturation is 97% on room air, adequate by my interpretation.    COORDINATION OF CARE: 18:51- Patient informed of clinical course, understands medical decision-making process, and agrees with plan.  Ordered IV fluids, atrovent, hepatic function panel, b-met, CBC, hip XR, head CT, and chest XR.    Labs Reviewed - No data to display Results for orders placed during the hospital encounter of 06/02/12  BASIC METABOLIC PANEL      Component Value Range   Sodium 135  135 - 145 mEq/L   Potassium 4.0  3.5 - 5.1 mEq/L   Chloride 99  96 - 112 mEq/L   CO2 20  19 - 32 mEq/L   Glucose, Bld 80  70 - 99 mg/dL   BUN 6  6 - 23 mg/dL   Creatinine, Ser 1.61  0.50 - 1.10 mg/dL   Calcium 9.1  8.4 - 09.6 mg/dL   GFR calc non Af Amer >90  >90 mL/min   GFR calc Af Amer >90  >90 mL/min  CBC      Component Value Range   WBC 6.6  4.0 - 10.5 K/uL   RBC 4.30  3.87 - 5.11 MIL/uL   Hemoglobin 15.9 (*) 12.0 - 15.0 g/dL   HCT 04.5  40.9 - 81.1 %   MCV 102.1 (*) 78.0 - 100.0 fL   MCH 37.0 (*) 26.0 - 34.0 pg   MCHC 36.2 (*) 30.0 - 36.0 g/dL   RDW 91.4  78.2 - 95.6 %   Platelets 263  150 - 400 K/uL  HEPATIC FUNCTION PANEL      Component Value Range   Total Protein 8.2  6.0 - 8.3 g/dL   Albumin 4.3  3.5 -  5.2 g/dL   AST 213 (*) 0 - 37 U/L   ALT 54 (*) 0 - 35 U/L   Alkaline Phosphatase 73  39 - 117 U/L   Total Bilirubin 0.4  0.3 - 1.2 mg/dL   Bilirubin, Direct 0.1  0.0 - 0.3 mg/dL   Indirect Bilirubin 0.3  0.3 - 0.9 mg/dL    Dg Chest 2 View  08/65/7846  *RADIOLOGY REPORT*  Clinical Data: Cough.  Left hip pain status post fall.  CHEST - 2 VIEW  Comparison: 01/09/2011 radiographs.  Findings: The heart size and mediastinal contours  are normal. The lungs are clear. There is no pleural effusion or pneumothorax. No acute osseous findings are identified.  Old rib fractures are noted bilaterally.  IMPRESSION: No acute cardiopulmonary process.   Original Report Authenticated By: Gerrianne Scale, M.D.    Dg Hip Complete Left  06/02/2012  *RADIOLOGY REPORT*  Clinical Data: Left hip pain status post fall.  LEFT HIP - COMPLETE 2+ VIEW  Comparison: 05/15/2010 radiographs.  Findings: The mineralization and alignment are normal.  There is no evidence of acute fracture or dislocation.  There is no evidence of femoral head osteonecrosis.  The hip joint spaces are preserved. There is a stable probable osteochondroma projecting laterally from the right ischium.  IMPRESSION: Stable examination.  No acute osseous findings.   Original Report Authenticated By: Gerrianne Scale, M.D.    Ct Head Wo Contrast  06/02/2012  *RADIOLOGY REPORT*  Clinical Data: Dizziness today.  History of uterine and breast cancer.  CT HEAD WITHOUT CONTRAST  Technique:  Contiguous axial images were obtained from the base of the skull through the vertex without contrast.  Comparison: Head CT 09/28/2011 and 01/09/2011.  Findings: There is stable mild generalized atrophy.  No acute intracranial hemorrhage, mass lesion, brain edema or extra-axial fluid collection is seen.  There is no evidence of hydrocephalus. The visualized paranasal sinuses are clear.  The calvarium is intact.  IMPRESSION: Stable examination.  No acute intracranial findings.    Original Report Authenticated By: Gerrianne Scale, M.D.      No diagnosis found.    MDM  Cynthia Church is a 46 y.o. female presenting with alcohol intoxication and some hip and ankle pain. There is no evidence of the ankle has sustained any fractures and she does not acknowledge pain when palpated, he has been walking on this ankle. She is tender in the left hip so will obtain x-rays. She says she's had falls multiple times in the last few days also obtain CT of the head.  Screening labs are unremarkable, x-rays are unremarkable. Patient is rehydrated and we'll discharge her home stable and good condition. I explained the diagnosis and have given explicit precautions to return to the ER including any other new or worsening symptoms. The patient understands and accepts the medical plan as it's been dictated and I have answered their questions. Discharge instructions concerning home care and prescriptions have been given.  The patient is STABLE and is discharged to home in good condition.  I personally performed the services described in this documentation, which was scribed in my presence. The recorded information has been reviewed and considered. Jones Skene, M.D.           Jones Skene, MD 06/05/12 2142

## 2012-06-02 NOTE — ED Notes (Signed)
Brought by EMS for dizziness. Reports she drinks, " 24 cans beer a day. I only had about 6 today. I feel dizzy. I have had diarrhea too" smells of ETOH. Denies cp or sob

## 2012-06-03 ENCOUNTER — Other Ambulatory Visit (HOSPITAL_COMMUNITY): Payer: Self-pay | Admitting: Family Medicine

## 2012-06-03 DIAGNOSIS — Z139 Encounter for screening, unspecified: Secondary | ICD-10-CM

## 2012-06-10 ENCOUNTER — Ambulatory Visit (HOSPITAL_COMMUNITY): Payer: Self-pay

## 2012-06-17 ENCOUNTER — Ambulatory Visit (HOSPITAL_COMMUNITY)
Admission: RE | Admit: 2012-06-17 | Discharge: 2012-06-17 | Disposition: A | Discharge status: Home / Self Care | Payer: Medicare Other | Source: Ambulatory Visit | Attending: Family Medicine | Admitting: Family Medicine

## 2012-06-17 DIAGNOSIS — Z139 Encounter for screening, unspecified: Secondary | ICD-10-CM

## 2012-06-17 DIAGNOSIS — Z1231 Encounter for screening mammogram for malignant neoplasm of breast: Secondary | ICD-10-CM | POA: Insufficient documentation

## 2012-06-18 ENCOUNTER — Emergency Department (HOSPITAL_COMMUNITY)
Admission: EM | Admit: 2012-06-18 | Discharge: 2012-06-18 | Discharge status: Against Medical Advice | Payer: Medicare Other | Attending: Emergency Medicine | Admitting: Emergency Medicine

## 2012-06-18 ENCOUNTER — Encounter (HOSPITAL_COMMUNITY): Payer: Self-pay | Admitting: *Deleted

## 2012-06-18 DIAGNOSIS — Z5321 Procedure and treatment not carried out due to patient leaving prior to being seen by health care provider: Secondary | ICD-10-CM

## 2012-06-18 DIAGNOSIS — R079 Chest pain, unspecified: Secondary | ICD-10-CM | POA: Insufficient documentation

## 2012-06-18 HISTORY — DX: Chronic obstructive pulmonary disease, unspecified: J44.9

## 2012-06-18 NOTE — ED Provider Notes (Signed)
History   This chart was scribed for Carleene Cooper III, MD by Gerlean Ren. This patient was seen in room APA08/APA08 and the patient's care was started at 3:03 PM    CSN: [id number]  Arrival date & time 06/18/12  1323   First MD Initiated Contact with Patient 06/18/12 1456      Chief Complaint  Patient presents with  . Chest Pain    (Consider location/radiation/quality/duration/timing/severity/associated sxs/prior treatment) HPI Cynthia Church is a 46 y.o. female who was not present in the room when checked at 3:03PM.  Past Medical History  Diagnosis Date  . Mental disorder   . Bipolar affective disorder, manic 1993    Dx'd at [facility]  . Personality disorders 1993  . Hyperthyroidism 1993  . Headache 09/25/2011  . Chronic pain   . Cancer 1989  . COPD (chronic obstructive pulmonary disease)     Past Surgical History  Procedure Date  . Back surgery   . Cauterization of uterine cancer 2008  . Breast surgery 2003    to check for possible cancer    Family History  Problem Relation Age of Onset  . Cirrhosis Father     History  Substance Use Topics  . Smoking status: Current Every Day Smoker -- 2.0 packs/day for 28 years  . Smokeless tobacco: Not on file  . Alcohol Use: 98.4 oz/week    164 Cans of beer per week    No OB history provided.  Review of Systems  Allergies  Review of patient's allergies indicates no known allergies.  Home Medications   Current Outpatient Rx  Name  Route  Sig  Dispense  Refill  . AZITHROMYCIN 250 MG PO TABS   Oral   Take 1 tablet (250 mg total) by mouth daily. Take first 2 tablets together, then 1 every day until finished.   6 tablet   0   . DIPHENHYDRAMINE-APAP (SLEEP) 25-500 MG PO TABS   Oral   Take 2 tablets by mouth at bedtime as needed. For sleep/pain         . PREDNISONE 50 MG PO TABS   Oral   Take 1 tablet (50 mg total) by mouth daily.   5 tablet   0     BP 113/55  Pulse 85  Temp 98.6 F (37 C) (Oral)   Resp 18  Wt 113 lb (51.256 kg)  SpO2 98%  Physical Exam  ED Course  Procedures (including critical care time) DIAGNOSTIC STUDIES: Oxygen Saturation is 98% on room air, normal by my interpretation.        Labs Reviewed - No data to display Mm Digital Screening  06/18/2012  *RADIOLOGY REPORT*  Clinical Data: Screening.  DIGITAL BILATERAL SCREENING MAMMOGRAM WITH CAD  Comparison:  Previous exams.  Findings:  The breast tissue is heterogeneously dense. No suspicious masses, architectural distortion, or calcifications are present.  Images were processed with CAD.  IMPRESSION: No mammographic evidence of malignancy.  A result letter of this screening mammogram will be mailed directly to the patient.  RECOMMENDATION: Screening mammogram in one year. (Code:SM-B-01Y)  BI-RADS CATEGORY 1:  Negative.   Original Report Authenticated By: Vincenza Hews, M.D.     Date: 06/18/2012  Rate: 77  Rhythm: normal sinus rhythm  QRS Axis: normal  Intervals: normal  ST/T Wave abnormalities: normal  Conduction Disutrbances:none  Narrative Interpretation: Normal EKG  Old EKG Reviewed: unchanged    1. Patient left without being seen    I personally performed  the services described in this documentation, which was scribed in my presence. The recorded information has been reviewed and considered.  Osvaldo Human, MD     Carleene Cooper III, MD 06/18/12 [phone]

## 2012-06-18 NOTE — ED Notes (Signed)
Patient is not in room

## 2012-06-18 NOTE — ED Notes (Signed)
Cp and sob , alert, cp started 20 min ago and sob  For several days.

## 2013-01-02 ENCOUNTER — Emergency Department (HOSPITAL_COMMUNITY)
Admission: EM | Admit: 2013-01-02 | Discharge: 2013-01-02 | Discharge status: Against Medical Advice | Payer: Medicare Other | Attending: Emergency Medicine | Admitting: Emergency Medicine

## 2013-01-02 ENCOUNTER — Encounter (HOSPITAL_COMMUNITY): Payer: Self-pay | Admitting: Emergency Medicine

## 2013-01-02 DIAGNOSIS — J449 Chronic obstructive pulmonary disease, unspecified: Secondary | ICD-10-CM | POA: Insufficient documentation

## 2013-01-02 DIAGNOSIS — L299 Pruritus, unspecified: Secondary | ICD-10-CM | POA: Insufficient documentation

## 2013-01-02 DIAGNOSIS — Z5321 Procedure and treatment not carried out due to patient leaving prior to being seen by health care provider: Secondary | ICD-10-CM

## 2013-01-02 DIAGNOSIS — J4489 Other specified chronic obstructive pulmonary disease: Secondary | ICD-10-CM | POA: Insufficient documentation

## 2013-01-02 DIAGNOSIS — R112 Nausea with vomiting, unspecified: Secondary | ICD-10-CM | POA: Insufficient documentation

## 2013-01-02 DIAGNOSIS — F17200 Nicotine dependence, unspecified, uncomplicated: Secondary | ICD-10-CM | POA: Insufficient documentation

## 2013-01-02 DIAGNOSIS — R197 Diarrhea, unspecified: Secondary | ICD-10-CM | POA: Insufficient documentation

## 2013-01-02 DIAGNOSIS — F1010 Alcohol abuse, uncomplicated: Secondary | ICD-10-CM | POA: Insufficient documentation

## 2013-01-02 NOTE — ED Notes (Signed)
Pt came to the desk and said she is feeling better and wishes to go home.  Pt signed ama form and agreed to come back if worse.  Pt home with family member

## 2013-01-02 NOTE — ED Notes (Signed)
Patient complaining of vomiting, diarrhea, itching, and breaking out in hives that started approximately an hour ago. Patient smells of etoh. No hives noted at this time.

## 2013-03-29 ENCOUNTER — Emergency Department (HOSPITAL_COMMUNITY)
Admission: EM | Admit: 2013-03-29 | Discharge: 2013-03-29 | Discharge status: Against Medical Advice | Payer: Medicare Other | Attending: Emergency Medicine | Admitting: Emergency Medicine

## 2013-03-29 ENCOUNTER — Encounter (HOSPITAL_COMMUNITY): Payer: Self-pay | Admitting: Emergency Medicine

## 2013-03-29 DIAGNOSIS — R5381 Other malaise: Secondary | ICD-10-CM | POA: Insufficient documentation

## 2013-03-29 DIAGNOSIS — R5383 Other fatigue: Secondary | ICD-10-CM | POA: Insufficient documentation

## 2013-03-29 DIAGNOSIS — F1020 Alcohol dependence, uncomplicated: Secondary | ICD-10-CM | POA: Insufficient documentation

## 2013-03-29 DIAGNOSIS — R42 Dizziness and giddiness: Secondary | ICD-10-CM | POA: Insufficient documentation

## 2013-03-29 DIAGNOSIS — R531 Weakness: Secondary | ICD-10-CM

## 2013-03-29 DIAGNOSIS — R079 Chest pain, unspecified: Secondary | ICD-10-CM | POA: Insufficient documentation

## 2013-03-29 DIAGNOSIS — F17200 Nicotine dependence, unspecified, uncomplicated: Secondary | ICD-10-CM | POA: Insufficient documentation

## 2013-03-29 HISTORY — DX: Alcohol dependence, uncomplicated: F10.20

## 2013-03-29 LAB — CBC WITH DIFFERENTIAL/PLATELET
Basophils Absolute: 0 10*3/uL (ref 0.0–0.1)
Eosinophils Absolute: 0.1 10*3/uL (ref 0.0–0.7)
Eosinophils Relative: 1 % (ref 0–5)
HCT: 43.8 % (ref 36.0–46.0)
Lymphocytes Relative: 52 % — ABNORMAL HIGH (ref 12–46)
Lymphs Abs: 3 10*3/uL (ref 0.7–4.0)
MCH: 36.7 pg — ABNORMAL HIGH (ref 26.0–34.0)
MCV: 103.8 fL — ABNORMAL HIGH (ref 78.0–100.0)
Monocytes Absolute: 0.6 10*3/uL (ref 0.1–1.0)
RDW: 14.1 % (ref 11.5–15.5)
WBC: 5.8 10*3/uL (ref 4.0–10.5)

## 2013-03-29 LAB — COMPREHENSIVE METABOLIC PANEL
CO2: 22 mEq/L (ref 19–32)
Calcium: 9.4 mg/dL (ref 8.4–10.5)
Creatinine, Ser: 0.58 mg/dL (ref 0.50–1.10)
GFR calc Af Amer: >90 mL/min (ref >90)
GFR calc non Af Amer: >90 mL/min (ref >90)
Glucose, Bld: 94 mg/dL (ref 70–99)
Total Protein: 8.6 g/dL — ABNORMAL HIGH (ref 6.0–8.3)

## 2013-03-29 LAB — TROPONIN I: Troponin I: <0.3 ng/mL (ref <0.30)

## 2013-03-29 LAB — ETHANOL: Alcohol, Ethyl (B): 364 mg/dL — ABNORMAL HIGH (ref 0–11)

## 2013-03-29 NOTE — ED Notes (Signed)
Called into pt's room, pt cussing at visitor, states " I am leaving, you need to take this iv out", attempts made to calm pt down without success, DR. Nanavati notified of pt's request to leave, no additional orders given, risks of leaving explained to pt and family at bedside, pt states "I am still leaving, if I have a heart attack, I have a heart attack".Marland Kitchen/

## 2013-03-29 NOTE — ED Notes (Addendum)
Pt c/o mid center chest pain that started yesterday, denies any n/v, admits to being sob pt also c/o abd being "swollen", abd tender to palpation, admits to drinking heavily every day, states "I am alcoholic" last drink was just prior to arrival in er, admits to a 6 pack of beer today, usually drinks 18 beers a day, , pt on cardiac monitor on arrival to er, NSR noted.

## 2013-03-29 NOTE — ED Notes (Addendum)
Pt c/o generalized chest pain intermittent since last night. Can not give description.  C/o weakness and dizziness. "cant half see". Nad. Pt was seen outside talking with family on sidewalk just prior to checking in. Stable on feet. nad at this time. Denies cp at this time. Pt states she is an alcoholic and last drank just prior to coming to ED.

## 2013-03-30 ENCOUNTER — Emergency Department (HOSPITAL_COMMUNITY)
Admission: EM | Admit: 2013-03-30 | Discharge: 2013-03-30 | Discharge status: Against Medical Advice | Payer: Medicare Other | Attending: Emergency Medicine | Admitting: Emergency Medicine

## 2013-03-30 ENCOUNTER — Emergency Department (HOSPITAL_COMMUNITY): Payer: Medicare Other

## 2013-03-30 ENCOUNTER — Encounter (HOSPITAL_COMMUNITY): Payer: Self-pay | Admitting: Emergency Medicine

## 2013-03-30 DIAGNOSIS — M7989 Other specified soft tissue disorders: Secondary | ICD-10-CM | POA: Insufficient documentation

## 2013-03-30 DIAGNOSIS — J441 Chronic obstructive pulmonary disease with (acute) exacerbation: Secondary | ICD-10-CM | POA: Insufficient documentation

## 2013-03-30 DIAGNOSIS — Z8659 Personal history of other mental and behavioral disorders: Secondary | ICD-10-CM | POA: Insufficient documentation

## 2013-03-30 DIAGNOSIS — H538 Other visual disturbances: Secondary | ICD-10-CM | POA: Insufficient documentation

## 2013-03-30 DIAGNOSIS — M2569 Stiffness of other specified joint, not elsewhere classified: Secondary | ICD-10-CM | POA: Insufficient documentation

## 2013-03-30 DIAGNOSIS — Z859 Personal history of malignant neoplasm, unspecified: Secondary | ICD-10-CM | POA: Insufficient documentation

## 2013-03-30 DIAGNOSIS — R51 Headache: Secondary | ICD-10-CM | POA: Insufficient documentation

## 2013-03-30 DIAGNOSIS — R42 Dizziness and giddiness: Secondary | ICD-10-CM | POA: Insufficient documentation

## 2013-03-30 DIAGNOSIS — F17200 Nicotine dependence, unspecified, uncomplicated: Secondary | ICD-10-CM | POA: Insufficient documentation

## 2013-03-30 DIAGNOSIS — M542 Cervicalgia: Secondary | ICD-10-CM | POA: Insufficient documentation

## 2013-03-30 DIAGNOSIS — J029 Acute pharyngitis, unspecified: Secondary | ICD-10-CM | POA: Insufficient documentation

## 2013-03-30 DIAGNOSIS — Z862 Personal history of diseases of the blood and blood-forming organs and certain disorders involving the immune mechanism: Secondary | ICD-10-CM | POA: Insufficient documentation

## 2013-03-30 DIAGNOSIS — R0789 Other chest pain: Secondary | ICD-10-CM | POA: Insufficient documentation

## 2013-03-30 DIAGNOSIS — Z8639 Personal history of other endocrine, nutritional and metabolic disease: Secondary | ICD-10-CM | POA: Insufficient documentation

## 2013-03-30 DIAGNOSIS — R079 Chest pain, unspecified: Secondary | ICD-10-CM

## 2013-03-30 LAB — CBC WITH DIFFERENTIAL/PLATELET
Lymphocytes Relative: 56 % — ABNORMAL HIGH (ref 12–46)
Lymphs Abs: 2.7 10*3/uL (ref 0.7–4.0)
MCV: 104.5 fL — ABNORMAL HIGH (ref 78.0–100.0)
Neutrophils Relative %: 30 % — ABNORMAL LOW (ref 43–77)
Platelets: 213 10*3/uL (ref 150–400)
RBC: 4.23 MIL/uL (ref 3.87–5.11)
WBC: 4.8 10*3/uL (ref 4.0–10.5)

## 2013-03-30 LAB — COMPREHENSIVE METABOLIC PANEL
ALT: 57 U/L — ABNORMAL HIGH (ref 0–35)
Alkaline Phosphatase: 83 U/L (ref 39–117)
CO2: 23 mEq/L (ref 19–32)
Chloride: 100 mEq/L (ref 96–112)
GFR calc Af Amer: >90 mL/min (ref >90)
GFR calc non Af Amer: >90 mL/min (ref >90)
Glucose, Bld: 87 mg/dL (ref 70–99)
Potassium: 3.9 mEq/L (ref 3.5–5.1)
Sodium: 139 mEq/L (ref 135–145)

## 2013-03-30 LAB — URINALYSIS, ROUTINE W REFLEX MICROSCOPIC
Bilirubin Urine: NEGATIVE
Ketones, ur: NEGATIVE mg/dL
Nitrite: NEGATIVE
Protein, ur: NEGATIVE mg/dL
Urobilinogen, UA: 0.2 mg/dL (ref 0.0–1.0)

## 2013-03-30 MED ORDER — SODIUM CHLORIDE 0.9 % IV BOLUS (SEPSIS)
1000.0000 mL | Freq: Once | INTRAVENOUS | Status: AC
  Administered 2013-03-30: 1000 mL via INTRAVENOUS

## 2013-03-30 MED ORDER — GI COCKTAIL ~~LOC~~
30.0000 mL | Freq: Once | ORAL | Status: AC
  Administered 2013-03-30: 30 mL via ORAL
  Filled 2013-03-30: qty 30

## 2013-03-30 MED ORDER — MORPHINE SULFATE 4 MG/ML IJ SOLN: 6.0000 mg | Freq: Once | INTRAMUSCULAR | Status: DC

## 2013-03-30 MED ORDER — IBUPROFEN 800 MG PO TABS
800.0000 mg | ORAL_TABLET | Freq: Once | ORAL | Status: AC
  Administered 2013-03-30: 800 mg via ORAL
  Filled 2013-03-30: qty 1

## 2013-03-30 NOTE — ED Notes (Signed)
Pt was asked to sign AMA before she left stated " she wasn't signing a damn thing "

## 2013-03-30 NOTE — ED Notes (Signed)
Pt states her ride is here and she needs to go

## 2013-03-30 NOTE — ED Notes (Addendum)
Per EMS, pt c/o of chest pain that is worse with a deep breath. Pt reports she drank 4 beers today and smokes 3 packs per day. Pt reports came to ED yesterday and left without being seen for same complaint as today. Pt alert and oriented. nad noted. Pt denies any pain at time of arrival to ED.

## 2013-03-30 NOTE — ED Provider Notes (Signed)
CSN: [id number]     Arrival date & time 03/30/13  1321 History  This chart was scribed for Enid Skeens, MD by Leone Payor, ED Scribe. This patient was seen in room APA07/APA07 and the patient's care was started 1:53 PM.    Chief Complaint  Patient presents with  . Chest Pain    The history is provided by the patient. No language interpreter was used.    HPI Comments: Cynthia Church is a 47 y.o. female brought in by ambulance, who presents to the Emergency Department complaining of a new episode of constant, unchanged, sharp chest pain that started at 8AM this morning. Pt was here with the same symptoms but left without being seen. She states her pain is worse with deep breathing and physical pressure. She has associated neck pain, neck stiffness, sore throat, HA, vision changes, SOB, lightheadedness.  Pt has h/o COPD. She denies taking any regular medications. Pt is a current everyday smoker and occasional alcohol user.    Past Medical History  Diagnosis Date  . Mental disorder   . Bipolar affective disorder, manic 1993    Dx'd at [facility]  . Personality disorders 1993  . Hyperthyroidism 1993  . Headache(784.0) 09/25/2011  . Chronic pain   . Cancer 1989  . COPD (chronic obstructive pulmonary disease)   . Alcoholism    Past Surgical History  Procedure Laterality Date  . Back surgery    . Cauterization of uterine cancer  2008  . Breast surgery  2003    to check for possible cancer   Family History  Problem Relation Age of Onset  . Cirrhosis Father    History  Substance Use Topics  . Smoking status: Current Every Day Smoker -- 3.00 packs/day for 28 years    Types: Cigarettes  . Smokeless tobacco: Not on file  . Alcohol Use: 98.4 oz/week    164 Cans of beer per week     Comment: daily 18 pack   OB History   Grav Para Term Preterm Abortions TAB SAB Ect Mult Living                 Review of Systems  HENT: Positive for sore throat, neck pain and neck  stiffness.   Eyes: Positive for visual disturbance.  Respiratory: Positive for shortness of breath.   Cardiovascular: Positive for chest pain and leg swelling.  Neurological: Positive for light-headedness and headaches.  All other systems reviewed and are negative.    Allergies  Review of patient's allergies indicates no known allergies.  Home Medications   Current Outpatient Rx  Name  Route  Sig  Dispense  Refill  . diphenhydramine-acetaminophen (TYLENOL PM) 25-500 MG TABS   Oral   Take 2 tablets by mouth at bedtime as needed. For sleep/pain          BP 152/95  Pulse 82  Temp(Src) 98.5 F (36.9 C) (Oral)  Resp 20  Ht 5\' 5"  (1.651 m)  Wt 110 lb (49.896 kg)  BMI 18.31 kg/m2  SpO2 95% Physical Exam  Nursing note and vitals reviewed. Constitutional: She is oriented to person, place, and time. She appears well-developed and well-nourished.  HENT:  Head: Normocephalic and atraumatic.  Eyes: Conjunctivae and EOM are normal. Pupils are equal, round, and reactive to light. No scleral icterus.  Neck: Normal range of motion. Neck supple. No JVD present.  Cardiovascular: Normal rate, regular rhythm and normal heart sounds.   No murmur heard. Pulmonary/Chest: Effort  normal. She has wheezes. She exhibits tenderness.  Expiratory wheezing bilaterally. No significant work with breathing. Parasternal tenderness.  Abdominal: Soft. Bowel sounds are normal. There is tenderness.  Epigastric tenderness. No bruising appreciated on abdomen.  Musculoskeletal: Normal range of motion.  No leg swelling.   Neurological: She is alert and oriented to person, place, and time.  Skin: Skin is warm and dry.  Psychiatric: She has a normal mood and affect.    ED Course   Procedures (including critical care time)  DIAGNOSTIC STUDIES: Oxygen Saturation is 95% on RA, adequate by my interpretation.    COORDINATION OF CARE: 1:57 PM Discussed treatment plan with pt at bedside and pt agreed to plan.    Labs Reviewed  CBC WITH DIFFERENTIAL - Abnormal; Notable for the following:    Hemoglobin 15.5 (*)    MCV 104.5 (*)    MCH 36.6 (*)    Neutrophils Relative % 30 (*)    Neutro Abs 1.5 (*)    Lymphocytes Relative 56 (*)    Monocytes Relative 13 (*)    All other components within normal limits  COMPREHENSIVE METABOLIC PANEL - Abnormal; Notable for the following:    BUN 4 (*)    AST 158 (*)    ALT 57 (*)    All other components within normal limits  URINALYSIS, ROUTINE W REFLEX MICROSCOPIC  LIPASE, BLOOD  TROPONIN I   Dg Chest 2 View  03/30/2013   *RADIOLOGY REPORT*  Clinical Data: 47 year old female with chest pain  CHEST - 2 VIEW  Comparison: 06/02/2012  Findings: The cardiomediastinal silhouette is unremarkable. The lungs are clear. There is no evidence of focal airspace disease, pulmonary edema, suspicious pulmonary nodule/mass, pleural effusion, or pneumothorax. No acute bony abnormalities are identified. Remote left rib fractures are again identified.  IMPRESSION: No evidence of active cardiopulmonary disease.   Original Report Authenticated By: Harmon Pier, M.D.   No diagnosis found.  MDM  I personally performed the services described in this documentation, which was scribed in my presence. The recorded information has been reviewed and is accurate.  Recurrent CP/ epig pain.  Multipel similar episodes.  Patient denies blood clot history, active cancer, recent major trauma or surgery, unilateral leg swelling/ pain, recent long travel, hemoptysis or oral contraceptives.Recent heavy etoh/ vomiting/ new food.  Likely GI / msk however will check cardiacs due to location of pain.   Date: 03/30/2013  Rate: 83  Rhythm: normal sinus rhythm  QRS Axis: normal  Intervals: QT prolonged  ST/T Wave abnormalities: normal  Conduction Disutrbances:none  Narrative Interpretation:   Old EKG Reviewed: none  Pain meds given .  Pt left AMA.  Pt has done similar things in the past, non  compliant.     Enid Skeens, MD 04/01/13 [phone]

## 2013-03-30 NOTE — ED Notes (Signed)
Pt pulled her IV out and said she was leaving AMA. States her ride is on the way and he will not come in to the hospital to get her. States she is not walking all the way to [facility]. EDP aware

## 2013-06-08 ENCOUNTER — Other Ambulatory Visit (HOSPITAL_COMMUNITY): Payer: Self-pay | Admitting: Family Medicine

## 2013-06-08 DIAGNOSIS — Z139 Encounter for screening, unspecified: Secondary | ICD-10-CM

## 2013-06-18 ENCOUNTER — Ambulatory Visit (HOSPITAL_COMMUNITY): Payer: Self-pay

## 2013-06-22 ENCOUNTER — Emergency Department (HOSPITAL_COMMUNITY): Payer: Medicare Other

## 2013-06-22 ENCOUNTER — Encounter (HOSPITAL_COMMUNITY): Payer: Self-pay | Admitting: Emergency Medicine

## 2013-06-22 ENCOUNTER — Emergency Department (HOSPITAL_COMMUNITY)
Admission: EM | Admit: 2013-06-22 | Discharge: 2013-06-23 | Disposition: A | Discharge status: Home / Self Care | Payer: Medicare Other | Attending: Emergency Medicine | Admitting: Emergency Medicine

## 2013-06-22 DIAGNOSIS — F1021 Alcohol dependence, in remission: Secondary | ICD-10-CM | POA: Insufficient documentation

## 2013-06-22 DIAGNOSIS — Y9389 Activity, other specified: Secondary | ICD-10-CM | POA: Insufficient documentation

## 2013-06-22 DIAGNOSIS — IMO0002 Reserved for concepts with insufficient information to code with codable children: Secondary | ICD-10-CM | POA: Insufficient documentation

## 2013-06-22 DIAGNOSIS — Z79899 Other long term (current) drug therapy: Secondary | ICD-10-CM | POA: Insufficient documentation

## 2013-06-22 DIAGNOSIS — Z8659 Personal history of other mental and behavioral disorders: Secondary | ICD-10-CM | POA: Insufficient documentation

## 2013-06-22 DIAGNOSIS — F17200 Nicotine dependence, unspecified, uncomplicated: Secondary | ICD-10-CM | POA: Insufficient documentation

## 2013-06-22 DIAGNOSIS — M25562 Pain in left knee: Secondary | ICD-10-CM

## 2013-06-22 DIAGNOSIS — Z8542 Personal history of malignant neoplasm of other parts of uterus: Secondary | ICD-10-CM | POA: Insufficient documentation

## 2013-06-22 DIAGNOSIS — J4489 Other specified chronic obstructive pulmonary disease: Secondary | ICD-10-CM | POA: Insufficient documentation

## 2013-06-22 DIAGNOSIS — J449 Chronic obstructive pulmonary disease, unspecified: Secondary | ICD-10-CM | POA: Insufficient documentation

## 2013-06-22 DIAGNOSIS — S8990XA Unspecified injury of unspecified lower leg, initial encounter: Secondary | ICD-10-CM | POA: Insufficient documentation

## 2013-06-22 DIAGNOSIS — R55 Syncope and collapse: Secondary | ICD-10-CM | POA: Insufficient documentation

## 2013-06-22 DIAGNOSIS — Y929 Unspecified place or not applicable: Secondary | ICD-10-CM | POA: Insufficient documentation

## 2013-06-22 DIAGNOSIS — E0590 Thyrotoxicosis, unspecified without thyrotoxic crisis or storm: Secondary | ICD-10-CM | POA: Insufficient documentation

## 2013-06-22 DIAGNOSIS — S0993XA Unspecified injury of face, initial encounter: Secondary | ICD-10-CM | POA: Insufficient documentation

## 2013-06-22 DIAGNOSIS — G8929 Other chronic pain: Secondary | ICD-10-CM | POA: Insufficient documentation

## 2013-06-22 DIAGNOSIS — W19XXXA Unspecified fall, initial encounter: Secondary | ICD-10-CM

## 2013-06-22 LAB — CBC WITH DIFFERENTIAL/PLATELET
Eosinophils Absolute: 0.1 10*3/uL (ref 0.0–0.7)
Eosinophils Relative: 1 % (ref 0–5)
Lymphs Abs: 2.5 10*3/uL (ref 0.7–4.0)
MCH: 36.6 pg — ABNORMAL HIGH (ref 26.0–34.0)
MCV: 105.2 fL — ABNORMAL HIGH (ref 78.0–100.0)
Platelets: 155 10*3/uL (ref 150–400)
RBC: 4.04 MIL/uL (ref 3.87–5.11)
RDW: 12.4 % (ref 11.5–15.5)

## 2013-06-22 NOTE — ED Notes (Signed)
Pt is asleep in the room and has scabbed abrasions to the forehead.

## 2013-06-22 NOTE — ED Provider Notes (Signed)
CSN: [id number]     Arrival date & time 06/22/13  1820 History   First MD Initiated Contact with Patient 06/22/13 2238     Chief Complaint  Patient presents with  . Knee Pain   (Consider location/radiation/quality/duration/timing/severity/associated sxs/prior Treatment) Patient is a 47 y.o. female presenting with knee pain. The history is provided by the patient.  Knee Pain Location:  Knee Time since incident:  5 hours Injury: yes   Mechanism of injury: fall   Fall:    Fall occurred: fell while getting into a car.   Impact surface:  Unable to specify   Point of impact:  Knees   Entrapped after fall: no   Knee location:  L knee Pain details:    Quality:  Aching   Radiates to:  Does not radiate   Severity:  Moderate   Onset quality:  Sudden   Timing:  Constant   Progression:  Unchanged Chronicity:  New Dislocation: no   Foreign body present:  No foreign bodies Prior injury to area:  No Relieved by:  Nothing Worsened by:  Bearing weight and flexion Ineffective treatments:  None tried Associated symptoms: neck pain   Associated symptoms: no back pain, no decreased ROM, no fever, no numbness, no swelling and no tingling    Patient states she has been drinking beer earlier this evening, states she drank "two forties" just before falling.  States that she was trying to get into a car and fall forward. States she is unsure if she "blacked out" but states she does not remember anything after that except getting inside the house and calling EMS.  She denies vomiting, dizziness, numbness or weakness.    Patient reports hx of chronic alcohol abuse, drinks "4-5 forties every day", denies substance abuse.       Past Medical History  Diagnosis Date  . Mental disorder   . Bipolar affective disorder, manic 1993    Dx'd at [facility]  . Personality disorders 1993  . Hyperthyroidism 1993  . Headache(784.0) 09/25/2011  . Chronic pain   . COPD (chronic obstructive pulmonary disease)    . Alcoholism   . Cancer 1989   Past Surgical History  Procedure Laterality Date  . Back surgery    . Cauterization of uterine cancer  2008  . Breast surgery  2003    to check for possible cancer  . Tubal ligaation     Family History  Problem Relation Age of Onset  . Cirrhosis Father    History  Substance Use Topics  . Smoking status: Current Every Day Smoker -- 3.00 packs/day for 28 years    Types: Cigarettes  . Smokeless tobacco: Not on file  . Alcohol Use: 98.4 oz/week    164 Cans of beer per week     Comment: daily 18 pack   OB History   Grav Para Term Preterm Abortions TAB SAB Ect Mult Living                 Review of Systems  Constitutional: Negative for fever and chills.  Eyes: Negative for visual disturbance.  Respiratory: Negative for chest tightness, shortness of breath and wheezing.   Cardiovascular: Negative for chest pain.  Gastrointestinal: Negative for nausea, vomiting and abdominal pain.  Genitourinary: Negative for dysuria and difficulty urinating.  Musculoskeletal: Positive for arthralgias and neck pain. Negative for back pain and joint swelling.  Skin: Negative for color change and wound.  Neurological: Positive for syncope. Negative for dizziness, speech  difficulty, weakness, light-headedness, numbness and headaches.  Hematological: Negative for adenopathy.  Psychiatric/Behavioral: Negative for confusion.  All other systems reviewed and are negative.    Allergies  Review of patient's allergies indicates no known allergies.  Home Medications   Current Outpatient Rx  Name  Route  Sig  Dispense  Refill  . acetaminophen (TYLENOL) 325 MG tablet   Oral   Take 650 mg by mouth every 6 (six) hours as needed for pain.         . diphenhydramine-acetaminophen (TYLENOL PM) 25-500 MG TABS   Oral   Take 2 tablets by mouth at bedtime as needed. For sleep/pain         . levothyroxine (SYNTHROID, LEVOTHROID) 150 MCG tablet   Oral   Take 150 mcg by  mouth daily before breakfast.          BP 122/80  Pulse 104  Temp(Src) 98.3 F (36.8 C) (Oral)  Resp 18  Wt 110 lb (49.896 kg)  SpO2 97% Physical Exam  Nursing note and vitals reviewed. Constitutional: She is oriented to person, place, and time. She appears well-developed and well-nourished. No distress.  HENT:  Right Ear: Tympanic membrane and ear canal normal.  Left Ear: Tympanic membrane and ear canal normal.  Mouth/Throat: Uvula is midline, oropharynx is clear and moist and mucous membranes are normal.  Abrasions to the forehead.  No edema or hematoma.  Eyes: Conjunctivae and EOM are normal. Pupils are equal, round, and reactive to light.  Neck: Normal range of motion and phonation normal. Neck supple. Muscular tenderness present. No spinous process tenderness present.  Cardiovascular: Normal rate, regular rhythm, normal heart sounds and intact distal pulses.   No murmur heard. Pulmonary/Chest: Effort normal and breath sounds normal. No respiratory distress. She exhibits no tenderness.  No chest wall tenderness or crepitus on exam  Abdominal: Soft. Normal appearance. She exhibits no distension and no mass. There is no tenderness. There is no rebound, no guarding and no CVA tenderness.  Musculoskeletal: She exhibits tenderness. She exhibits no edema.       Left knee: She exhibits normal range of motion, no swelling, no effusion, no ecchymosis, no deformity, no laceration and no erythema. Tenderness found. Medial joint line, lateral joint line and patellar tendon tenderness noted.       Legs: Localized ttp of the anterior left knee.  No effusion, erythema, bruising or abrasions.  Pt has full ROM of the knee with pain reproduced on full flexion.  DP pulse brisk, distal sensation intact.  Pelvis NT, bilateral hip flexors and extensors are intact.    Neurological: She is alert and oriented to person, place, and time. She exhibits normal muscle tone. Coordination normal.  Skin: Skin is  warm and dry.    ED Course  Procedures (including critical care time) Labs Review Labs Reviewed  CBC WITH DIFFERENTIAL  BASIC METABOLIC PANEL  ETHANOL  URINE RAPID DRUG SCREEN (HOSP PERFORMED)   Imaging Review Ct Head Wo Contrast  06/22/2013   CLINICAL DATA:  Fall. Abrasion on forehead  EXAM: CT HEAD WITHOUT CONTRAST  CT CERVICAL SPINE WITHOUT CONTRAST  TECHNIQUE: Multidetector CT imaging of the head and cervical spine was performed following the standard protocol without intravenous contrast. Multiplanar CT image reconstructions of the cervical spine were also generated.  COMPARISON:  Prior CT from 06/02/2013  FINDINGS: CT HEAD FINDINGS  Moderate generalized atrophy is stable as compared to the previous examination. No acute intracranial hemorrhage or infarct identified. No mass  or midline shift. No extra-axial fluid collection. Gray-white matter differentiation is maintained.  Calvarium is intact without evidence of skull fracture. No definite scalp laceration appreciated. Orbits are normal.  Visualized paranasal sinuses and mastoid air cells are clear.  CT CERVICAL SPINE FINDINGS  There is straightening of the normal cervical lordosis. Vertebral body heights are preserved. Normal C1-2 articulations are intact. The odontoid process is intact. No prevertebral soft tissue swelling. Moderate degenerative disc disease with disc bulge is seen at the C5-6 level. Degenerative disc bulge is also seen at C6-7.  No soft tissue abnormality identified. Visualized lungs are clear.  IMPRESSION: CT BRAIN:  Stable exam with no acute intracranial process identified.  CT CERVICAL SPINE:  1. No CT evidence of acute disease at 2. Multilevel degenerative disc disease, most prominent at C5-6 and C6-7.   Electronically Signed   By: Rise Mu M.D.   On: 06/22/2013 23:36   Ct Cervical Spine Wo Contrast  06/22/2013   CLINICAL DATA:  Fall. Abrasion on forehead  EXAM: CT HEAD WITHOUT CONTRAST  CT CERVICAL SPINE  WITHOUT CONTRAST  TECHNIQUE: Multidetector CT imaging of the head and cervical spine was performed following the standard protocol without intravenous contrast. Multiplanar CT image reconstructions of the cervical spine were also generated.  COMPARISON:  Prior CT from 06/02/2013  FINDINGS: CT HEAD FINDINGS  Moderate generalized atrophy is stable as compared to the previous examination. No acute intracranial hemorrhage or infarct identified. No mass or midline shift. No extra-axial fluid collection. Gray-white matter differentiation is maintained.  Calvarium is intact without evidence of skull fracture. No definite scalp laceration appreciated. Orbits are normal.  Visualized paranasal sinuses and mastoid air cells are clear.  CT CERVICAL SPINE FINDINGS  There is straightening of the normal cervical lordosis. Vertebral body heights are preserved. Normal C1-2 articulations are intact. The odontoid process is intact. No prevertebral soft tissue swelling. Moderate degenerative disc disease with disc bulge is seen at the C5-6 level. Degenerative disc bulge is also seen at C6-7.  No soft tissue abnormality identified. Visualized lungs are clear.  IMPRESSION: CT BRAIN:  Stable exam with no acute intracranial process identified.  CT CERVICAL SPINE:  1. No CT evidence of acute disease at 2. Multilevel degenerative disc disease, most prominent at C5-6 and C6-7.   Electronically Signed   By: Rise Mu M.D.   On: 06/22/2013 23:36   Dg Knee Complete 4 Views Left  06/22/2013   CLINICAL DATA:  Fall, knee pain.  EXAM: LEFT KNEE - COMPLETE 4+ VIEW  COMPARISON:  None.  FINDINGS: There is no evidence of fracture, dislocation, or joint effusion. There is no evidence of arthropathy or other focal bone abnormality. Soft tissues are unremarkable.  IMPRESSION: Negative.   Electronically Signed   By: Charlett Nose M.D.   On: 06/22/2013 19:05    EKG Interpretation   None       MDM    Patient has been observed in the  dept w/o complications.  Labs and imaging discussed with patient.  She has ambulated to the restroom with a steady gait. No focal neuro deficits, Has drank fluids and ate a snack.  Answers questions appropriately, mentating well.  Agrees to elevate, ice to the knee and close f/u with her PMD, Dr. Janna Arch.    She appears stable for discharge and verbalized understanding of care plan  Voncille Simm L. Dim Meisinger, PA-C 06/23/13 0045

## 2013-06-22 NOTE — ED Notes (Addendum)
Pain lt knee, fell when getting into car, abrasion to forehead.  Pt says she has been drinking etoh, 2 "40's" today.

## 2013-06-23 LAB — BASIC METABOLIC PANEL WITH GFR
BUN: 3 mg/dL — ABNORMAL LOW (ref 6–23)
CO2: 24 meq/L (ref 19–32)
Calcium: 9.1 mg/dL (ref 8.4–10.5)
Chloride: 99 meq/L (ref 96–112)
Creatinine, Ser: 0.58 mg/dL (ref 0.50–1.10)
GFR calc Af Amer: >90 mL/min
GFR calc non Af Amer: >90 mL/min
Glucose, Bld: 101 mg/dL — ABNORMAL HIGH (ref 70–99)
Potassium: 3.3 meq/L — ABNORMAL LOW (ref 3.5–5.1)
Sodium: 137 meq/L (ref 135–145)

## 2013-06-23 LAB — ETHANOL: Alcohol, Ethyl (B): 37 mg/dL — ABNORMAL HIGH (ref 0–11)

## 2013-06-23 MED ORDER — NAPROXEN 500 MG PO TABS: 500.0000 mg | ORAL_TABLET | Freq: Two times a day (BID) | ORAL | Status: DC

## 2013-06-23 NOTE — ED Provider Notes (Signed)
Medical screening examination/treatment/procedure(s) were performed by non-physician practitioner and as supervising physician I was immediately available for consultation/collaboration.  EKG Interpretation   None       Devoria Albe, MD, Armando Gang   Ward Givens, MD 06/23/13 [phone]

## 2013-07-06 ENCOUNTER — Ambulatory Visit (HOSPITAL_COMMUNITY): Payer: Self-pay

## 2013-07-23 ENCOUNTER — Ambulatory Visit (HOSPITAL_COMMUNITY)
Admission: RE | Admit: 2013-07-23 | Discharge: 2013-07-23 | Disposition: A | Discharge status: Home / Self Care | Payer: Medicare Other | Source: Ambulatory Visit | Attending: Family Medicine | Admitting: Family Medicine

## 2013-07-23 DIAGNOSIS — Z139 Encounter for screening, unspecified: Secondary | ICD-10-CM

## 2013-07-23 DIAGNOSIS — Z1231 Encounter for screening mammogram for malignant neoplasm of breast: Secondary | ICD-10-CM | POA: Insufficient documentation

## 2013-12-27 ENCOUNTER — Encounter (HOSPITAL_COMMUNITY): Payer: Self-pay | Admitting: Emergency Medicine

## 2013-12-27 ENCOUNTER — Emergency Department (HOSPITAL_COMMUNITY)
Admission: EM | Admit: 2013-12-27 | Discharge: 2013-12-27 | Disposition: A | Discharge status: Home / Self Care | Payer: Medicare Other | Attending: Emergency Medicine | Admitting: Emergency Medicine

## 2013-12-27 DIAGNOSIS — E0590 Thyrotoxicosis, unspecified without thyrotoxic crisis or storm: Secondary | ICD-10-CM | POA: Insufficient documentation

## 2013-12-27 DIAGNOSIS — Z859 Personal history of malignant neoplasm, unspecified: Secondary | ICD-10-CM | POA: Insufficient documentation

## 2013-12-27 DIAGNOSIS — Z791 Long term (current) use of non-steroidal anti-inflammatories (NSAID): Secondary | ICD-10-CM | POA: Insufficient documentation

## 2013-12-27 DIAGNOSIS — L299 Pruritus, unspecified: Secondary | ICD-10-CM | POA: Insufficient documentation

## 2013-12-27 DIAGNOSIS — F17200 Nicotine dependence, unspecified, uncomplicated: Secondary | ICD-10-CM | POA: Insufficient documentation

## 2013-12-27 DIAGNOSIS — T394X5A Adverse effect of antirheumatics, not elsewhere classified, initial encounter: Secondary | ICD-10-CM | POA: Insufficient documentation

## 2013-12-27 DIAGNOSIS — T7840XA Allergy, unspecified, initial encounter: Secondary | ICD-10-CM

## 2013-12-27 DIAGNOSIS — Z79899 Other long term (current) drug therapy: Secondary | ICD-10-CM | POA: Insufficient documentation

## 2013-12-27 DIAGNOSIS — R110 Nausea: Secondary | ICD-10-CM | POA: Insufficient documentation

## 2013-12-27 DIAGNOSIS — R0609 Other forms of dyspnea: Secondary | ICD-10-CM | POA: Insufficient documentation

## 2013-12-27 DIAGNOSIS — R0989 Other specified symptoms and signs involving the circulatory and respiratory systems: Secondary | ICD-10-CM | POA: Insufficient documentation

## 2013-12-27 DIAGNOSIS — J4489 Other specified chronic obstructive pulmonary disease: Secondary | ICD-10-CM | POA: Insufficient documentation

## 2013-12-27 DIAGNOSIS — R21 Rash and other nonspecific skin eruption: Secondary | ICD-10-CM | POA: Insufficient documentation

## 2013-12-27 DIAGNOSIS — Z8659 Personal history of other mental and behavioral disorders: Secondary | ICD-10-CM | POA: Insufficient documentation

## 2013-12-27 DIAGNOSIS — J449 Chronic obstructive pulmonary disease, unspecified: Secondary | ICD-10-CM | POA: Insufficient documentation

## 2013-12-27 DIAGNOSIS — G8929 Other chronic pain: Secondary | ICD-10-CM | POA: Insufficient documentation

## 2013-12-27 DIAGNOSIS — F1021 Alcohol dependence, in remission: Secondary | ICD-10-CM | POA: Insufficient documentation

## 2013-12-27 MED ORDER — DIPHENHYDRAMINE HCL 25 MG PO TABS: 25.0000 mg | ORAL_TABLET | ORAL | Status: DC | PRN

## 2013-12-27 MED ORDER — LORATADINE 10 MG PO TABS: 10.0000 mg | ORAL_TABLET | Freq: Every day | ORAL | Status: DC

## 2013-12-27 MED ORDER — DIPHENHYDRAMINE HCL 50 MG/ML IJ SOLN
25.0000 mg | Freq: Once | INTRAMUSCULAR | Status: AC
  Administered 2013-12-27: 25 mg via INTRAVENOUS
  Filled 2013-12-27: qty 1

## 2013-12-27 MED ORDER — METHYLPREDNISOLONE SODIUM SUCC 125 MG IJ SOLR
125.0000 mg | Freq: Once | INTRAMUSCULAR | Status: AC
  Administered 2013-12-27: 125 mg via INTRAVENOUS
  Filled 2013-12-27: qty 2

## 2013-12-27 MED ORDER — PREDNISONE 50 MG PO TABS: 50.0000 mg | ORAL_TABLET | Freq: Every day | ORAL | Status: DC

## 2013-12-27 NOTE — ED Provider Notes (Addendum)
CSN: [id number]     Arrival date & time 12/27/13  0042 History   First MD Initiated Contact with Patient 12/27/13 0122     Chief Complaint  Patient presents with  . Urticaria  . Allergic Reaction     (Consider location/radiation/quality/duration/timing/severity/associated sxs/prior Treatment) Patient is a 48 y.o. female presenting with urticaria and allergic reaction. The history is provided by the patient.  Urticaria  Allergic Reaction She noted breaking out in hives with generalized itching about 4 hours after taking a dose of Aleve. There is associated dyspnea and nausea. She called EMS who gave her diphenhydramine and she states that she's feeling much better. Dyspnea and nausea have resolved and itching is much better.  Past Medical History  Diagnosis Date  . Mental disorder   . Bipolar affective disorder, manic 1993    Dx'd at [facility]  . Personality disorders 1993  . Hyperthyroidism 1993  . Headache(784.0) 09/25/2011  . Chronic pain   . COPD (chronic obstructive pulmonary disease)   . Alcoholism   . Cancer 1989   Past Surgical History  Procedure Laterality Date  . Back surgery    . Cauterization of uterine cancer  2008  . Breast surgery  2003    to check for possible cancer  . Tubal ligaation     Family History  Problem Relation Age of Onset  . Cirrhosis Father    History  Substance Use Topics  . Smoking status: Current Every Day Smoker -- 3.00 packs/day for 28 years    Types: Cigarettes  . Smokeless tobacco: Not on file  . Alcohol Use: 98.4 oz/week    164 Cans of beer per week     Comment: daily 18 pack   OB History   Grav Para Term Preterm Abortions TAB SAB Ect Mult Living                 Review of Systems  All other systems reviewed and are negative.     Allergies  Review of patient's allergies indicates no known allergies.  Home Medications   Prior to Admission medications   Medication Sig Start Date End Date Taking? Authorizing  Provider  acetaminophen (TYLENOL) 325 MG tablet Take 650 mg by mouth every 6 (six) hours as needed for pain.    Historical Provider, MD  diphenhydramine-acetaminophen (TYLENOL PM) 25-500 MG TABS Take 2 tablets by mouth at bedtime as needed. For sleep/pain    Historical Provider, MD  levothyroxine (SYNTHROID, LEVOTHROID) 150 MCG tablet Take 150 mcg by mouth daily before breakfast.    Historical Provider, MD  naproxen (NAPROSYN) 500 MG tablet Take 1 tablet (500 mg total) by mouth 2 (two) times daily. Take with food 06/23/13   Tammy L. Triplett, PA-C   BP 153/103  Pulse 88  Temp(Src) 97.8 F (36.6 C) (Oral)  Resp 24  Ht 5\' 5"  (1.651 m)  Wt 130 lb (58.968 kg)  BMI 21.63 kg/m2  SpO2 97% Physical Exam  Nursing note and vitals reviewed.  48 year old female, resting comfortably and in no acute distress. Vital signs are significant for hypertension with blood pressure 153/103, and tachypnea with respiratory rate of 24. Oxygen saturation is 97%, which is normal. Head is normocephalic and atraumatic. PERRLA, EOMI. Oropharynx is clear. Neck is nontender and supple without adenopathy or JVD. Back is nontender and there is no CVA tenderness. Lungs are clear without rales, wheezes, or rhonchi. Chest is nontender. Heart has regular rate and rhythm without murmur. Abdomen  is soft, flat, nontender without masses or hepatosplenomegaly and peristalsis is normoactive. Extremities have no cyanosis or edema, full range of motion is present. Skin is warm and dry without rash. Neurologic: Mental status is normal, cranial nerves are intact, there are no motor or sensory deficits.  ED Course  Procedures (including critical care time)  MDM   Final diagnoses:  Allergic reaction    Apparent episode of urticaria that has responded well to diphenhydramine. No evidence of urticaria is present on my exam but nurse to get her initial evaluation stated that there were some hives present at that time. She is given  a dose of methylprednisolone and will be observed in the ED. She is concerned that the reaction was from naproxen, but she could be reacting to any one of a number of things besides naproxen.  While being observed in the ED, she started complaining of more itching. Reexam shows a faint, erythematous rash which is slightly raised and areas consistent with urticaria and/or allergic reaction. She's given additional dose of diphenhydramine and is observed further. She demonstrated no deterioration other than the recurrence of a faint rash. She is discharged with prescription for prednisone as well as loratadine and diphenhydramine. She is to take prednisone and loratadine on a daily basis and take diphenhydramine as needed for breakthrough itching.  Delora Fuel, MD 21/19/41 [id number]  Delora Fuel, MD 14/48/18 [id number]

## 2013-12-27 NOTE — Discharge Instructions (Signed)
Hives Hives are itchy, red, swollen areas of the skin. They can vary in size and location on your body. Hives can come and go for hours or several days (acute hives) or for several weeks (chronic hives). Hives do not spread from person to person (noncontagious). They may get worse with scratching, exercise, and emotional stress. CAUSES   Allergic reaction to food, additives, or drugs.  Infections, including the common cold.  Illness, such as vasculitis, lupus, or thyroid disease.  Exposure to sunlight, heat, or cold.  Exercise.  Stress.  Contact with chemicals. SYMPTOMS   Red or white swollen patches on the skin. The patches may change size, shape, and location quickly and repeatedly.  Itching.  Swelling of the hands, feet, and face. This may occur if hives develop deeper in the skin. DIAGNOSIS  Your caregiver can usually tell what is wrong by performing a physical exam. Skin or blood tests may also be done to determine the cause of your hives. In some cases, the cause cannot be determined. TREATMENT  Mild cases usually get better with medicines such as antihistamines. Severe cases may require an emergency epinephrine injection. If the cause of your hives is known, treatment includes avoiding that trigger.  HOME CARE INSTRUCTIONS   Avoid causes that trigger your hives.  Take antihistamines as directed by your caregiver to reduce the severity of your hives. Non-sedating or low-sedating antihistamines are usually recommended. Do not drive while taking an antihistamine.  Take any other medicines prescribed for itching as directed by your caregiver.  Wear loose-fitting clothing.  Keep all follow-up appointments as directed by your caregiver. SEEK MEDICAL CARE IF:   You have persistent or severe itching that is not relieved with medicine.  You have painful or swollen joints. SEEK IMMEDIATE MEDICAL CARE IF:   You have a fever.  Your tongue or lips are swollen.  You have  trouble breathing or swallowing.  You feel tightness in the throat or chest.  You have abdominal pain. These problems may be the first sign of a life-threatening allergic reaction. Call your local emergency services (911 in [place] MAKE SURE YOU:   Understand these instructions.  Will watch your condition.  Will get help right away if you are not doing well or get worse. Document Released: 07/30/2005 Document Revised: 01/29/2012 Document Reviewed: 10/23/2011 [facility] Patient Information 2014 [place].  Prednisone tablets What is this medicine? PREDNISONE (PRED ni sone) is a corticosteroid. It is commonly used to treat inflammation of the skin, joints, lungs, and other organs. Common conditions treated include asthma, allergies, and arthritis. It is also used for other conditions, such as blood disorders and diseases of the adrenal glands. This medicine may be used for other purposes; ask your health care provider or pharmacist if you have questions. COMMON BRAND NAME(S): Deltasone, Predone, Sterapred DS, Sterapred What should I tell my health care provider before I take this medicine? They need to know if you have any of these conditions: -Cushing's syndrome -diabetes -glaucoma -heart disease -high blood pressure -infection (especially a virus infection such as chickenpox, cold sores, or herpes) -kidney disease -liver disease -mental illness -myasthenia gravis -osteoporosis -seizures -stomach or intestine problems -thyroid disease -an unusual or allergic reaction to lactose, prednisone, other medicines, foods, dyes, or preservatives -pregnant or trying to get pregnant -breast-feeding How should I use this medicine? Take this medicine by mouth with a glass of water. Follow the directions on the prescription label. Take this medicine with food. If you are  taking this medicine once a day, take it in the morning. Do not take more medicine than you are told to take. Do not  suddenly stop taking your medicine because you may develop a severe reaction. Your doctor will tell you how much medicine to take. If your doctor wants you to stop the medicine, the dose may be slowly lowered over time to avoid any side effects. Talk to your pediatrician regarding the use of this medicine in children. Special care may be needed. Overdosage: If you think you have taken too much of this medicine contact a poison control center or emergency room at once. NOTE: This medicine is only for you. Do not share this medicine with others. What if I miss a dose? If you miss a dose, take it as soon as you can. If it is almost time for your next dose, talk to your doctor or health care professional. You may need to miss a dose or take an extra dose. Do not take double or extra doses without advice. What may interact with this medicine? Do not take this medicine with any of the following medications: -metyrapone -mifepristone This medicine may also interact with the following medications: -aminoglutethimide -amphotericin B -aspirin and aspirin-like medicines -barbiturates -certain medicines for diabetes, like glipizide or glyburide -cholestyramine -cholinesterase inhibitors -cyclosporine -digoxin -diuretics -ephedrine -female hormones, like estrogens and birth control pills -isoniazid -ketoconazole -NSAIDS, medicines for pain and inflammation, like ibuprofen or naproxen -phenytoin -rifampin -toxoids -vaccines -warfarin This list may not describe all possible interactions. Give your health care provider a list of all the medicines, herbs, non-prescription drugs, or dietary supplements you use. Also tell them if you smoke, drink alcohol, or use illegal drugs. Some items may interact with your medicine. What should I watch for while using this medicine? Visit your doctor or health care professional for regular checks on your progress. If you are taking this medicine over a prolonged  period, carry an identification card with your name and address, the type and dose of your medicine, and your doctor's name and address. This medicine may increase your risk of getting an infection. Tell your doctor or health care professional if you are around anyone with measles or chickenpox, or if you develop sores or blisters that do not heal properly. If you are going to have surgery, tell your doctor or health care professional that you have taken this medicine within the last twelve months. Ask your doctor or health care professional about your diet. You may need to lower the amount of salt you eat. This medicine may affect blood sugar levels. If you have diabetes, check with your doctor or health care professional before you change your diet or the dose of your diabetic medicine. What side effects may I notice from receiving this medicine? Side effects that you should report to your doctor or health care professional as soon as possible: -allergic reactions like skin rash, itching or hives, swelling of the face, lips, or tongue -changes in emotions or moods -changes in vision -depressed mood -eye pain -fever or chills, cough, sore throat, pain or difficulty passing urine -increased thirst -swelling of ankles, feet Side effects that usually do not require medical attention (report to your doctor or health care professional if they continue or are bothersome): -confusion, excitement, restlessness -headache -nausea, vomiting -skin problems, acne, thin and shiny skin -trouble sleeping -weight gain This list may not describe all possible side effects. Call your doctor for medical advice about side effects. You  may report side effects to FDA at 1-800-FDA-1088. Where should I keep my medicine? Keep out of the reach of children. Store at room temperature between 15 and 30 degrees C (59 and 86 degrees F). Protect from light. Keep container tightly closed. Throw away any unused medicine after  the expiration date. NOTE: This sheet is a summary. It may not cover all possible information. If you have questions about this medicine, talk to your doctor, pharmacist, or health care provider.  2014, [employer]. (2011-03-15 10:57:14)  Loratadine tablets What is this medicine? LORATADINE (lor AT a deen) is an antihistamine. It helps to relieve sneezing, runny nose, and itchy, watery eyes. This medicine is used to treat the symptoms of allergies. It is also used to treat itchy skin rash and hives. This medicine may be used for other purposes; ask your health care provider or pharmacist if you have questions. COMMON BRAND NAME(S): Alavert, Allergy Relief, Claritin Hives Relief, Claritin, Clear-Atadine , Tavist ND What should I tell my health care provider before I take this medicine? They need to know if you have any of these conditions: -asthma -kidney disease -liver disease -an unusual or allergic reaction to loratadine, other antihistamines, other medicines, foods, dyes, or preservatives -pregnant or trying to get pregnant -breast-feeding How should I use this medicine? Take this medicine by mouth with a glass of water. Follow the directions on the label. You may take this medicine with food or on an empty stomach. Take your medicine at regular intervals. Do not take your medicine more often than directed. Talk to your pediatrician regarding the use of this medicine in children. While this medicine may be used in children as young as 6 years for selected conditions, precautions do apply. Overdosage: If you think you have taken too much of this medicine contact a poison control center or emergency room at once. NOTE: This medicine is only for you. Do not share this medicine with others. What if I miss a dose? If you miss a dose, take it as soon as you can. If it is almost time for your next dose, take only that dose. Do not take double or extra doses. What may interact with  this medicine? -other medicines for colds or allergies This list may not describe all possible interactions. Give your health care provider a list of all the medicines, herbs, non-prescription drugs, or dietary supplements you use. Also tell them if you smoke, drink alcohol, or use illegal drugs. Some items may interact with your medicine. What should I watch for while using this medicine? Tell your doctor or healthcare professional if your symptoms do not start to get better or if they get worse. Your mouth may get dry. Chewing sugarless gum or sucking hard candy, and drinking plenty of water may help. Contact your doctor if the problem does not go away or is severe. You may get drowsy or dizzy. Do not drive, use machinery, or do anything that needs mental alertness until you know how this medicine affects you. Do not stand or sit up quickly, especially if you are an older patient. This reduces the risk of dizzy or fainting spells. What side effects may I notice from receiving this medicine? Side effects that you should report to your doctor or health care professional as soon as possible: -allergic reactions like skin rash, itching or hives, swelling of the face, lips, or tongue -breathing problems -unusually restless or nervous Side effects that usually do not require medical attention (report  to your doctor or health care professional if they continue or are bothersome): -drowsiness -dry or irritated mouth or throat -headache This list may not describe all possible side effects. Call your doctor for medical advice about side effects. You may report side effects to FDA at 1-800-FDA-1088. Where should I keep my medicine? Keep out of the reach of children. Store at room temperature between 2 and 30 degrees C (36 and 86 degrees F). Protect from moisture. Throw away any unused medicine after the expiration date. NOTE: This sheet is a summary. It may not cover all possible information. If you have  questions about this medicine, talk to your doctor, pharmacist, or health care provider.  2014, [employer]. (2008-02-02 17:17:24)  Diphenhydramine capsules or tablets What is this medicine? DIPHENHYDRAMINE (dye fen HYE dra meen) is an antihistamine. It is used to treat the symptoms of an allergic reaction. It is also used to treat Parkinson's disease. This medicine is also used to prevent and to treat motion sickness and as a nighttime sleep aid. This medicine may be used for other purposes; ask your health care provider or pharmacist if you have questions. COMMON BRAND NAME(S): Alka-Seltzer Plus Allergy, Banophen , Benadryl Allergy Dye Free, Benadryl Allergy Kapgel, Benadryl Allergy Ultratab, Benadryl Allergy, Diphedryl , Diphenhist, Genahist , Q-Dryl, Gretta Began, Valu-Dryl , Vicks ZzzQuil Nightime Sleep-Aid What should I tell my health care provider before I take this medicine? They need to know if you have any of these conditions: -asthma or lung disease -glaucoma -high blood pressure or heart disease -liver disease -pain or difficulty passing urine -prostate trouble -ulcers or other stomach problems -an unusual or allergic reaction to diphenhydramine, other medicines foods, dyes, or preservatives such as sulfites -pregnant or trying to get pregnant -breast-feeding How should I use this medicine? Take this medicine by mouth with a full glass of water. Follow the directions on the prescription label. Take your doses at regular intervals. Do not take your medicine more often than directed. To prevent motion sickness start taking this medicine 30 to 60 minutes before you leave. Talk to your pediatrician regarding the use of this medicine in children. Special care may be needed. Patients over 61 years old may have a stronger reaction and need a smaller dose. Overdosage: If you think you have taken too much of this medicine contact a poison control center or emergency room at  once. NOTE: This medicine is only for you. Do not share this medicine with others. What if I miss a dose? If you miss a dose, take it as soon as you can. If it is almost time for your next dose, take only that dose. Do not take double or extra doses. What may interact with this medicine? Do not take this medicine with any of the following medications: -MAOIs like Carbex, Eldepryl, Marplan, Nardil, and Parnate This medicine may also interact with the following medications: -alcohol -barbiturates, like phenobarbital -medicines for bladder spasm like oxybutynin, tolterodine -medicines for blood pressure -medicines for depression, anxiety, or psychotic disturbances -medicines for movement abnormalities or Parkinson's disease -medicines for sleep -other medicines for cold, cough or allergy -some medicines for the stomach like chlordiazepoxide, dicyclomine This list may not describe all possible interactions. Give your health care provider a list of all the medicines, herbs, non-prescription drugs, or dietary supplements you use. Also tell them if you smoke, drink alcohol, or use illegal drugs. Some items may interact with your medicine. What should I watch for while using this medicine?  Visit your doctor or health care professional for regular check ups. Tell your doctor if your symptoms do not improve or if they get worse. Your mouth may get dry. Chewing sugarless gum or sucking hard candy, and drinking plenty of water may help. Contact your doctor if the problem does not go away or is severe. This medicine may cause dry eyes and blurred vision. If you wear contact lenses you may feel some discomfort. Lubricating drops may help. See your eye doctor if the problem does not go away or is severe. You may get drowsy or dizzy. Do not drive, use machinery, or do anything that needs mental alertness until you know how this medicine affects you. Do not stand or sit up quickly, especially if you are an  older patient. This reduces the risk of dizzy or fainting spells. Alcohol may interfere with the effect of this medicine. Avoid alcoholic drinks. What side effects may I notice from receiving this medicine? Side effects that you should report to your doctor or health care professional as soon as possible: -allergic reactions like skin rash, itching or hives, swelling of the face, lips, or tongue -changes in vision -confused, agitated, nervous -irregular or fast heartbeat -tremor -trouble passing urine -unusual bleeding or bruising -unusually weak or tired Side effects that usually do not require medical attention (report to your doctor or health care professional if they continue or are bothersome): -constipation, diarrhea -drowsy -headache -loss of appetite -stomach upset, vomiting -thick mucous This list may not describe all possible side effects. Call your doctor for medical advice about side effects. You may report side effects to FDA at 1-800-FDA-1088. Where should I keep my medicine? Keep out of the reach of children. Store at room temperature between 15 and 30 degrees C (59 and 86 degrees F). Keep container closed tightly. Throw away any unused medicine after the expiration date. NOTE: This sheet is a summary. It may not cover all possible information. If you have questions about this medicine, talk to your doctor, pharmacist, or health care provider.  2014, [employer]. (2007-11-17 17:06:22)

## 2013-12-27 NOTE — ED Notes (Signed)
EMS administered 50 mg of benadryl IV PTA.

## 2013-12-27 NOTE — ED Notes (Signed)
Patient reports took aleve 5 hours ago. Reports hives, shortness of breath, and nausea that started approximately an hour ago.

## 2014-01-07 ENCOUNTER — Ambulatory Visit (HOSPITAL_COMMUNITY)
Admission: RE | Admit: 2014-01-07 | Discharge: 2014-01-07 | Disposition: A | Discharge status: Home / Self Care | Payer: Medicare Other | Source: Ambulatory Visit | Attending: Family Medicine | Admitting: Family Medicine

## 2014-01-07 ENCOUNTER — Other Ambulatory Visit (HOSPITAL_COMMUNITY): Payer: Self-pay | Admitting: Family Medicine

## 2014-01-07 DIAGNOSIS — M79609 Pain in unspecified limb: Secondary | ICD-10-CM | POA: Insufficient documentation

## 2014-01-07 DIAGNOSIS — M25579 Pain in unspecified ankle and joints of unspecified foot: Secondary | ICD-10-CM | POA: Insufficient documentation

## 2014-01-07 DIAGNOSIS — T148XXA Other injury of unspecified body region, initial encounter: Secondary | ICD-10-CM

## 2014-01-09 ENCOUNTER — Encounter (HOSPITAL_COMMUNITY): Payer: Self-pay | Admitting: Emergency Medicine

## 2014-01-09 ENCOUNTER — Emergency Department (HOSPITAL_COMMUNITY): Payer: Medicare Other

## 2014-01-09 ENCOUNTER — Emergency Department (HOSPITAL_COMMUNITY)
Admission: EM | Admit: 2014-01-09 | Discharge: 2014-01-09 | Disposition: A | Discharge status: Home / Self Care | Payer: Medicare Other | Attending: Emergency Medicine | Admitting: Emergency Medicine

## 2014-01-09 DIAGNOSIS — R51 Headache: Secondary | ICD-10-CM | POA: Insufficient documentation

## 2014-01-09 DIAGNOSIS — Z853 Personal history of malignant neoplasm of breast: Secondary | ICD-10-CM | POA: Insufficient documentation

## 2014-01-09 DIAGNOSIS — E0590 Thyrotoxicosis, unspecified without thyrotoxic crisis or storm: Secondary | ICD-10-CM | POA: Insufficient documentation

## 2014-01-09 DIAGNOSIS — R519 Headache, unspecified: Secondary | ICD-10-CM

## 2014-01-09 DIAGNOSIS — J4489 Other specified chronic obstructive pulmonary disease: Secondary | ICD-10-CM | POA: Insufficient documentation

## 2014-01-09 DIAGNOSIS — Z79899 Other long term (current) drug therapy: Secondary | ICD-10-CM | POA: Insufficient documentation

## 2014-01-09 DIAGNOSIS — G8929 Other chronic pain: Secondary | ICD-10-CM | POA: Insufficient documentation

## 2014-01-09 DIAGNOSIS — F17200 Nicotine dependence, unspecified, uncomplicated: Secondary | ICD-10-CM | POA: Insufficient documentation

## 2014-01-09 DIAGNOSIS — Z8659 Personal history of other mental and behavioral disorders: Secondary | ICD-10-CM | POA: Insufficient documentation

## 2014-01-09 DIAGNOSIS — M25569 Pain in unspecified knee: Secondary | ICD-10-CM | POA: Insufficient documentation

## 2014-01-09 DIAGNOSIS — J449 Chronic obstructive pulmonary disease, unspecified: Secondary | ICD-10-CM | POA: Insufficient documentation

## 2014-01-09 DIAGNOSIS — R42 Dizziness and giddiness: Secondary | ICD-10-CM | POA: Insufficient documentation

## 2014-01-09 DIAGNOSIS — Z791 Long term (current) use of non-steroidal anti-inflammatories (NSAID): Secondary | ICD-10-CM | POA: Insufficient documentation

## 2014-01-09 DIAGNOSIS — I10 Essential (primary) hypertension: Secondary | ICD-10-CM | POA: Insufficient documentation

## 2014-01-09 DIAGNOSIS — H53149 Visual discomfort, unspecified: Secondary | ICD-10-CM | POA: Insufficient documentation

## 2014-01-09 DIAGNOSIS — IMO0002 Reserved for concepts with insufficient information to code with codable children: Secondary | ICD-10-CM | POA: Insufficient documentation

## 2014-01-09 DIAGNOSIS — F1021 Alcohol dependence, in remission: Secondary | ICD-10-CM | POA: Insufficient documentation

## 2014-01-09 MED ORDER — MECLIZINE HCL 12.5 MG PO TABS
25.0000 mg | ORAL_TABLET | Freq: Once | ORAL | Status: DC
  Filled 2014-01-09: qty 2

## 2014-01-09 MED ORDER — SODIUM CHLORIDE 0.9 % IV SOLN: Freq: Once | INTRAVENOUS | Status: DC

## 2014-01-09 NOTE — ED Notes (Signed)
Pt c/o right knee pain that started 3 weeks ago, blood pressure being irregular for the past two weeks, cms intact distal, unsure of any injury, states that she may have fell but does not remember any fall,

## 2014-01-09 NOTE — ED Notes (Addendum)
Pt states her ride is here and she has to go now.  Leaving ama.  Tammy Triplett advised pt of risk of her blood pressure being elevated.

## 2014-01-10 NOTE — ED Provider Notes (Signed)
CSN: [id number]     Arrival date & time 01/09/14  0743 History   First MD Initiated Contact with Patient 01/09/14 0820     Chief Complaint  Patient presents with  . Knee Pain  . Hypertension     (Consider location/radiation/quality/duration/timing/severity/associated sxs/prior Treatment) Patient is a 48 y.o. female presenting with hypertension. The history is provided by the patient.  Hypertension This is a new problem. Episode onset: 2 weeks. The problem occurs intermittently. The problem has been unchanged. Associated symptoms include arthralgias, headaches and vertigo. Pertinent negatives include no abdominal pain, chest pain, fever, joint swelling, nausea, neck pain, numbness, rash, sore throat, visual change, vomiting or weakness. Associated symptoms comments: Right knee pain for 3 weeks. Denies known injury.  Also c/o dizziness. Nothing aggravates the symptoms. She has tried nothing for the symptoms. The treatment provided no relief.   Patient with hx of chronic alcohol abuse, comes to ED with multiple complaints.  She states that she has been having episodes of elevated BP for 2 weeks w/o h/o of documented HTN.  She also reports diffuse headaches with dizziness upon standing and vertigo sensation for several days.  She states that she has to hold on the things when walking sometimes.  She denies vomiting, neck pain, numbness or weakness, or visual changes.  She also c/o pain to right knee for 3 weeks w/o known injury.  She denies redness or swelling to the knee.  She states that she saw her PMD 2-3 days ago, but "he didn't do anything for me".     Past Medical History  Diagnosis Date  . Mental disorder   . Bipolar affective disorder, manic 1993    Dx'd at [facility]  . Personality disorders 1993  . Hyperthyroidism 1993  . Headache(784.0) 09/25/2011  . Chronic pain   . COPD (chronic obstructive pulmonary disease)   . Alcoholism   . Cancer 1989   Past Surgical History   Procedure Laterality Date  . Back surgery    . Cauterization of uterine cancer  2008  . Breast surgery  2003    to check for possible cancer  . Tubal ligaation     Family History  Problem Relation Age of Onset  . Cirrhosis Father    History  Substance Use Topics  . Smoking status: Current Every Day Smoker -- 3.00 packs/day for 28 years    Types: Cigarettes  . Smokeless tobacco: Not on file  . Alcohol Use: 98.4 oz/week    164 Cans of beer per week     Comment: daily 18 pack   OB History   Grav Para Term Preterm Abortions TAB SAB Ect Mult Living                 Review of Systems  Constitutional: Negative for fever, activity change and appetite change.  HENT: Negative for facial swelling, sore throat and trouble swallowing.   Eyes: Positive for photophobia. Negative for pain and visual disturbance.  Respiratory: Negative for chest tightness and shortness of breath.   Cardiovascular: Negative for chest pain.  Gastrointestinal: Negative for nausea, vomiting and abdominal pain.  Genitourinary: Negative for dysuria.  Musculoskeletal: Positive for arthralgias. Negative for joint swelling, neck pain and neck stiffness.  Skin: Negative for rash and wound.  Neurological: Positive for dizziness, vertigo and headaches. Negative for syncope, facial asymmetry, speech difficulty, weakness and numbness.  Psychiatric/Behavioral: Negative for confusion and decreased concentration.  All other systems reviewed and are negative.  Allergies  Review of patient's allergies indicates no known allergies.  Home Medications   Prior to Admission medications   Medication Sig Start Date End Date Taking? Authorizing Provider  acetaminophen (TYLENOL) 325 MG tablet Take 650 mg by mouth every 6 (six) hours as needed for pain.    Historical Provider, MD  diphenhydrAMINE (BENADRYL) 25 MG tablet Take 1 tablet (25 mg total) by mouth every 4 (four) hours as needed for itching. 2/67/12   Delora Fuel, MD   diphenhydramine-acetaminophen (TYLENOL PM) 25-500 MG TABS Take 2 tablets by mouth at bedtime as needed. For sleep/pain    Historical Provider, MD  levothyroxine (SYNTHROID, LEVOTHROID) 150 MCG tablet Take 150 mcg by mouth daily before breakfast.    Historical Provider, MD  loratadine (CLARITIN) 10 MG tablet Take 1 tablet (10 mg total) by mouth daily. 4/58/09   Delora Fuel, MD  naproxen (NAPROSYN) 500 MG tablet Take 1 tablet (500 mg total) by mouth 2 (two) times daily. Take with food 06/23/13   Marisol Giambra L. Jehieli Brassell, PA-C  predniSONE (DELTASONE) 50 MG tablet Take 1 tablet (50 mg total) by mouth daily. 9/83/38   Delora Fuel, MD   BP 250/539  Pulse 93  Temp(Src) 98.5 F (36.9 C) (Oral)  Resp 16  Ht 5\' 5"  (1.651 m)  Wt 130 lb (58.968 kg)  BMI 21.63 kg/m2  SpO2 99% Physical Exam  Nursing note and vitals reviewed. Constitutional: She is oriented to person, place, and time. She appears well-developed.  Patient appears much older than stated age.  Thin, unkempt.  Smells of ETOH  HENT:  Head: Normocephalic and atraumatic.  Mouth/Throat: Oropharynx is clear and moist.  Eyes: EOM are normal. Pupils are equal, round, and reactive to light.  Neck: Normal range of motion and phonation normal. Neck supple. No spinous process tenderness and no muscular tenderness present. No rigidity. No Brudzinski's sign and no Kernig's sign noted.  Cardiovascular: Normal rate, regular rhythm, normal heart sounds and intact distal pulses.   No murmur heard. Pulmonary/Chest: Effort normal and breath sounds normal. No respiratory distress.  Musculoskeletal: Normal range of motion. She exhibits tenderness.  Diffuse ttp of the right anterior knee.  No erythema, effusion, or step-off deformity.  DP pulse brisk, distal sensation intact. Calf is soft and NT.  Neurological: She is alert and oriented to person, place, and time. She has normal strength. No cranial nerve deficit or sensory deficit. She exhibits normal muscle tone.  Coordination and gait normal. GCS eye subscore is 4. GCS verbal subscore is 5. GCS motor subscore is 6.  Reflex Scores:      Tricep reflexes are 2+ on the right side and 2+ on the left side.      Bicep reflexes are 2+ on the right side and 2+ on the left side. Skin: Skin is warm and dry. No erythema.  Psychiatric: She has a normal mood and affect.    ED Course  Procedures (including critical care time) Labs Review Labs Reviewed - No data to display  Imaging Review No results found.   EKG Interpretation None      MDM   Final diagnoses:  Headache  Hypertension   Labs and CT head were ordered.  I was informed by nursing staff that patient is leaving the dept.    I asked patient why she is leaving and she stated that she "dosen't have all day to wait around this place".  I have advised her of the risks of leaving given her current  sx's to include stroke, heart attack, SAH and even death, but she continues to prefer to leave and agrees to sign out AMA.  I have advised her to return if she changes her mind.  Patient ambulated out of the dept unassisted.     Marques Ericson L. Magnolia Mattila, PA-C 01/10/14 1427

## 2014-01-18 NOTE — ED Provider Notes (Signed)
Medical screening examination/treatment/procedure(s) were performed by non-physician practitioner and as supervising physician I was immediately available for consultation/collaboration.   EKG Interpretation None       Nat Christen, MD 01/18/14 1851

## 2014-01-21 ENCOUNTER — Encounter (HOSPITAL_COMMUNITY): Payer: Self-pay | Admitting: Emergency Medicine

## 2014-01-21 ENCOUNTER — Emergency Department (HOSPITAL_COMMUNITY)
Admission: EM | Admit: 2014-01-21 | Discharge: 2014-01-21 | Disposition: A | Discharge status: Home / Self Care | Payer: Medicare Other | Attending: Emergency Medicine | Admitting: Emergency Medicine

## 2014-01-21 DIAGNOSIS — Z791 Long term (current) use of non-steroidal anti-inflammatories (NSAID): Secondary | ICD-10-CM | POA: Insufficient documentation

## 2014-01-21 DIAGNOSIS — F1021 Alcohol dependence, in remission: Secondary | ICD-10-CM | POA: Insufficient documentation

## 2014-01-21 DIAGNOSIS — Z79899 Other long term (current) drug therapy: Secondary | ICD-10-CM | POA: Insufficient documentation

## 2014-01-21 DIAGNOSIS — M25569 Pain in unspecified knee: Secondary | ICD-10-CM | POA: Insufficient documentation

## 2014-01-21 DIAGNOSIS — G8929 Other chronic pain: Secondary | ICD-10-CM | POA: Insufficient documentation

## 2014-01-21 DIAGNOSIS — J4489 Other specified chronic obstructive pulmonary disease: Secondary | ICD-10-CM | POA: Insufficient documentation

## 2014-01-21 DIAGNOSIS — J449 Chronic obstructive pulmonary disease, unspecified: Secondary | ICD-10-CM | POA: Insufficient documentation

## 2014-01-21 DIAGNOSIS — M25579 Pain in unspecified ankle and joints of unspecified foot: Secondary | ICD-10-CM | POA: Insufficient documentation

## 2014-01-21 DIAGNOSIS — Z8542 Personal history of malignant neoplasm of other parts of uterus: Secondary | ICD-10-CM | POA: Insufficient documentation

## 2014-01-21 DIAGNOSIS — IMO0002 Reserved for concepts with insufficient information to code with codable children: Secondary | ICD-10-CM | POA: Insufficient documentation

## 2014-01-21 DIAGNOSIS — F17200 Nicotine dependence, unspecified, uncomplicated: Secondary | ICD-10-CM | POA: Insufficient documentation

## 2014-01-21 DIAGNOSIS — F1010 Alcohol abuse, uncomplicated: Secondary | ICD-10-CM

## 2014-01-21 DIAGNOSIS — E0590 Thyrotoxicosis, unspecified without thyrotoxic crisis or storm: Secondary | ICD-10-CM | POA: Insufficient documentation

## 2014-01-21 DIAGNOSIS — R42 Dizziness and giddiness: Secondary | ICD-10-CM | POA: Insufficient documentation

## 2014-01-21 NOTE — ED Notes (Signed)
Pt was cursing and upset because POA wouldn't come to pick her up.  Stated "she was going to leave and hoped she would get hit by a truck" , I recalled her POA and he stated it would take him a few minutes to leave, before he could pick her up. After I told her her guardian would pick her up, she calmed down and was given something to drink.

## 2014-01-21 NOTE — ED Notes (Signed)
Per EMS, patient got into an argument with her significant other earlier tonight; has been walking around.  Patient has been drinking ETOH tonight and states all she wants is a cigarette.  EMS states they picked up patient lying down in the drive thru at [facility].

## 2014-01-21 NOTE — ED Provider Notes (Signed)
CSN: [id number]     Arrival date & time 01/21/14  0538 History   First MD Initiated Contact with Patient 01/21/14 [phone]     Chief Complaint  Patient presents with  . Panic Attack     (Consider location/radiation/quality/duration/timing/severity/associated sxs/prior Treatment) HPI Patient states she got an argument with her boyfriend earlier this evening and became upset. She was found lying in the parking lot at [employer]. She admits to drinking multiple beers prior. She states she is not suicidal was not trying to hurt herself. She complains of chronic right knee and right ankle pain which is been previously evaluated. There is no recent changes. Past Medical History  Diagnosis Date  . Mental disorder   . Bipolar affective disorder, manic 1993    Dx'd at [facility]  . Personality disorders 1993  . Hyperthyroidism 1993  . Headache(784.0) 09/25/2011  . Chronic pain   . COPD (chronic obstructive pulmonary disease)   . Alcoholism   . Cancer 1989   Past Surgical History  Procedure Laterality Date  . Back surgery    . Cauterization of uterine cancer  2008  . Breast surgery  2003    to check for possible cancer  . Tubal ligaation     Family History  Problem Relation Age of Onset  . Cirrhosis Father    History  Substance Use Topics  . Smoking status: Current Every Day Smoker -- 3.00 packs/day for 28 years    Types: Cigarettes  . Smokeless tobacco: Not on file  . Alcohol Use: 98.4 oz/week    164 Cans of beer per week     Comment: daily 18 pack   OB History   Grav Para Term Preterm Abortions TAB SAB Ect Mult Living                 Review of Systems  Musculoskeletal: Positive for arthralgias.  Neurological: Positive for dizziness and light-headedness. Negative for weakness and numbness.  Psychiatric/Behavioral: Negative for suicidal ideas and self-injury.  All other systems reviewed and are negative.     Allergies  Review of patient's allergies indicates no known  allergies.  Home Medications   Prior to Admission medications   Medication Sig Start Date End Date Taking? Authorizing Provider  acetaminophen (TYLENOL) 325 MG tablet Take 650 mg by mouth every 6 (six) hours as needed for pain.    Historical Provider, MD  diphenhydrAMINE (BENADRYL) 25 MG tablet Take 1 tablet (25 mg total) by mouth every 4 (four) hours as needed for itching. 7/67/34   Delora Fuel, MD  diphenhydramine-acetaminophen (TYLENOL PM) 25-500 MG TABS Take 2 tablets by mouth at bedtime as needed. For sleep/pain    Historical Provider, MD  levothyroxine (SYNTHROID, LEVOTHROID) 150 MCG tablet Take 150 mcg by mouth daily before breakfast.    Historical Provider, MD  loratadine (CLARITIN) 10 MG tablet Take 1 tablet (10 mg total) by mouth daily. 1/93/79   Delora Fuel, MD  naproxen (NAPROSYN) 500 MG tablet Take 1 tablet (500 mg total) by mouth 2 (two) times daily. Take with food 06/23/13   Tammy L. Triplett, PA-C  predniSONE (DELTASONE) 50 MG tablet Take 1 tablet (50 mg total) by mouth daily. 0/24/09   Delora Fuel, MD   BP 735/329  Pulse 84  Temp(Src) 98.5 F (36.9 C) (Oral)  Resp 18  Ht 5\' 6"  (1.676 m)  Wt 120 lb (54.432 kg)  BMI 19.38 kg/m2  SpO2 100% Physical Exam  Nursing note and vitals reviewed.  Constitutional: She is oriented to person, place, and time. She appears well-developed and well-nourished. No distress.  Patient is drowsy but easily aroused.  HENT:  Head: Normocephalic and atraumatic.  Mouth/Throat: Oropharynx is clear and moist.  No evidence of any trauma  Eyes: EOM are normal. Pupils are equal, round, and reactive to light.  Neck: Normal range of motion. Neck supple.  Cardiovascular: Normal rate and regular rhythm.   Pulmonary/Chest: Effort normal and breath sounds normal. No respiratory distress. She has no wheezes. She has no rales.  Abdominal: Soft. Bowel sounds are normal. She exhibits no distension and no mass. There is no tenderness. There is no rebound and  no guarding.  Musculoskeletal: Normal range of motion. She exhibits no edema and no tenderness.  Full range of motion of the right knee and right ankle. There is no ligamentous laxity. No effusions present. No warmth or redness. Good distal pulses. No evidence of trauma. No calf swelling or tenderness.  Neurological: She is oriented to person, place, and time.  She is ambulatory in the emergency department without assistance. 5/5 motor in all extremities. Sensation is grossly intact.  Skin: Skin is warm and dry. No rash noted. No erythema.  Psychiatric: She has a normal mood and affect. Her behavior is normal.    ED Course  Procedures (including critical care time) Labs Review Labs Reviewed - No data to display  Imaging Review No results found.   EKG Interpretation None      MDM   Final diagnoses:  None    Patient is ambulatory in the department. There are no acute injuries. I don't believe imaging is necessary at this point. We'll observe briefly in the emergency department but anticipate discharge home.  Nursing note implies patient had suicidal intent. I directly asked the patient about this and she states that she felt she was going to get hit by a car earlier but that she does not want to be a car. She has no suicidal intent. Patient will call for a ride with family member.  Julianne Rice, MD 01/21/14 [phone]

## 2014-01-21 NOTE — ED Notes (Signed)
POA here to get patient, patient given hospital number to speak to case manager per request of POA.

## 2014-01-21 NOTE — ED Notes (Signed)
Pt. Stating "I just want to leave. I am going to leave. I hope I get hit by a car when I get out of here" EDP notified.

## 2014-01-21 NOTE — Discharge Instructions (Signed)
Alcohol Intoxication °Alcohol intoxication occurs when the amount of alcohol that a person has consumed impairs his or her ability to mentally and physically function. Alcohol directly impairs the normal chemical activity of the brain. Drinking large amounts of alcohol can lead to changes in mental function and behavior, and it can cause many physical effects that can be harmful.  °Alcohol intoxication can range in severity from mild to very severe. Various factors can affect the level of intoxication that occurs, such as the person's age, gender, weight, frequency of alcohol consumption, and the presence of other medical conditions (such as diabetes, seizures, or heart conditions). Dangerous levels of alcohol intoxication may occur when people drink large amounts of alcohol in a short period (binge drinking). Alcohol can also be especially dangerous when combined with certain prescription medicines or "recreational" drugs. °SIGNS AND SYMPTOMS °Some common signs and symptoms of mild alcohol intoxication include: °· Loss of coordination. °· Changes in mood and behavior. °· Impaired judgment. °· Slurred speech. °As alcohol intoxication progresses to more severe levels, other signs and symptoms will appear. These may include: °· Vomiting. °· Confusion and impaired memory. °· Slowed breathing. °· Seizures. °· Loss of consciousness. °DIAGNOSIS  °Your health care provider will take a medical history and perform a physical exam. You will be asked about the amount and type of alcohol you have consumed. Blood tests will be done to measure the concentration of alcohol in your blood. In many places, your blood alcohol level must be lower than 80 mg/dL (0.08%) to legally drive. However, many dangerous effects of alcohol can occur at much lower levels.  °TREATMENT  °People with alcohol intoxication often do not require treatment. Most of the effects of alcohol intoxication are temporary, and they go away as the alcohol naturally  leaves the body. Your health care provider will monitor your condition until you are stable enough to go home. Fluids are sometimes given through an IV access tube to help prevent dehydration.  °HOME CARE INSTRUCTIONS °· Do not drive after drinking alcohol. °· Stay hydrated. Drink enough water and fluids to keep your urine clear or pale yellow. Avoid caffeine.   °· Only take over-the-counter or prescription medicines as directed by your health care provider.   °SEEK MEDICAL CARE IF:  °· You have persistent vomiting.   °· You do not feel better after a few days. °· You have frequent alcohol intoxication. Your health care provider can help determine if you should see a substance use treatment counselor. °SEEK IMMEDIATE MEDICAL CARE IF:  °· You become shaky or tremble when you try to stop drinking.   °· You shake uncontrollably (seizure).   °· You throw up (vomit) blood. This may be bright red or may look like black coffee grounds.   °· You have blood in your stool. This may be bright red or may appear as a black, tarry, bad smelling stool.   °· You become lightheaded or faint.   °MAKE SURE YOU:  °· Understand these instructions. °· Will watch your condition. °· Will get help right away if you are not doing well or get worse. °Document Released: 05/09/2005 Document Revised: 04/01/2013 Document Reviewed: 01/02/2013 °ExitCare® Patient Information ©2014 [employer]. ° °

## 2014-02-01 ENCOUNTER — Emergency Department (HOSPITAL_COMMUNITY)
Admission: EM | Admit: 2014-02-01 | Discharge: 2014-02-01 | Disposition: A | Discharge status: Home / Self Care | Payer: Medicare Other

## 2014-02-01 NOTE — ED Notes (Signed)
Called for pt x 1.  

## 2014-02-01 NOTE — ED Notes (Signed)
Pt not in waiting room

## 2014-02-01 NOTE — ED Notes (Signed)
Pt not in waiting room.   Called for pt x 3.

## 2014-02-03 ENCOUNTER — Emergency Department (HOSPITAL_COMMUNITY): Payer: Medicare Other

## 2014-02-03 ENCOUNTER — Emergency Department (HOSPITAL_COMMUNITY)
Admission: EM | Admit: 2014-02-03 | Discharge: 2014-02-03 | Discharge status: Against Medical Advice | Payer: Medicare Other | Attending: Emergency Medicine | Admitting: Emergency Medicine

## 2014-02-03 ENCOUNTER — Encounter (HOSPITAL_COMMUNITY): Payer: Self-pay | Admitting: Emergency Medicine

## 2014-02-03 DIAGNOSIS — G8929 Other chronic pain: Secondary | ICD-10-CM | POA: Diagnosis not present

## 2014-02-03 DIAGNOSIS — F17200 Nicotine dependence, unspecified, uncomplicated: Secondary | ICD-10-CM | POA: Diagnosis not present

## 2014-02-03 DIAGNOSIS — Z8542 Personal history of malignant neoplasm of other parts of uterus: Secondary | ICD-10-CM | POA: Insufficient documentation

## 2014-02-03 DIAGNOSIS — R079 Chest pain, unspecified: Secondary | ICD-10-CM | POA: Diagnosis not present

## 2014-02-03 DIAGNOSIS — J441 Chronic obstructive pulmonary disease with (acute) exacerbation: Secondary | ICD-10-CM | POA: Insufficient documentation

## 2014-02-03 DIAGNOSIS — R0602 Shortness of breath: Secondary | ICD-10-CM | POA: Diagnosis present

## 2014-02-03 DIAGNOSIS — Z8639 Personal history of other endocrine, nutritional and metabolic disease: Secondary | ICD-10-CM | POA: Diagnosis not present

## 2014-02-03 DIAGNOSIS — Z8659 Personal history of other mental and behavioral disorders: Secondary | ICD-10-CM | POA: Diagnosis not present

## 2014-02-03 DIAGNOSIS — R42 Dizziness and giddiness: Secondary | ICD-10-CM | POA: Diagnosis not present

## 2014-02-03 DIAGNOSIS — Z862 Personal history of diseases of the blood and blood-forming organs and certain disorders involving the immune mechanism: Secondary | ICD-10-CM | POA: Insufficient documentation

## 2014-02-03 LAB — BASIC METABOLIC PANEL
BUN: 3 mg/dL — ABNORMAL LOW (ref 6–23)
CALCIUM: 8.5 mg/dL (ref 8.4–10.5)
CHLORIDE: 98 meq/L (ref 96–112)
CO2: 20 meq/L (ref 19–32)
Creatinine, Ser: 0.66 mg/dL (ref 0.50–1.10)
GFR calc Af Amer: >90 mL/min (ref >90)
GFR calc non Af Amer: >90 mL/min (ref >90)
Glucose, Bld: 88 mg/dL (ref 70–99)
Potassium: 3.7 mEq/L (ref 3.7–5.3)
SODIUM: 136 meq/L — AB (ref 137–147)

## 2014-02-03 LAB — CBC WITH DIFFERENTIAL/PLATELET
Basophils Absolute: 0 10*3/uL (ref 0.0–0.1)
Basophils Relative: 1 % (ref 0–1)
EOS ABS: 0.1 10*3/uL (ref 0.0–0.7)
EOS PCT: 1 % (ref 0–5)
HCT: 42.3 % (ref 36.0–46.0)
Hemoglobin: 15.1 g/dL — ABNORMAL HIGH (ref 12.0–15.0)
LYMPHS ABS: 2.7 10*3/uL (ref 0.7–4.0)
LYMPHS PCT: 47 % — AB (ref 12–46)
MCH: 36.9 pg — ABNORMAL HIGH (ref 26.0–34.0)
MCHC: 35.7 g/dL (ref 30.0–36.0)
MCV: 103.4 fL — AB (ref 78.0–100.0)
MONOS PCT: 8 % (ref 3–12)
Monocytes Absolute: 0.4 10*3/uL (ref 0.1–1.0)
Neutro Abs: 2.4 10*3/uL (ref 1.7–7.7)
Neutrophils Relative %: 43 % (ref 43–77)
PLATELETS: 187 10*3/uL (ref 150–400)
RBC: 4.09 MIL/uL (ref 3.87–5.11)
RDW: 14 % (ref 11.5–15.5)
WBC: 5.6 10*3/uL (ref 4.0–10.5)

## 2014-02-03 LAB — TROPONIN I

## 2014-02-03 LAB — ETHANOL: Alcohol, Ethyl (B): 333 mg/dL — ABNORMAL HIGH (ref 0–11)

## 2014-02-03 NOTE — ED Notes (Addendum)
Pt reports to the ED with multiple complaints, one being SOB and "I haven't taken my thryroid medication because it makes me act stupid." Pt reports drinking "2 40's" and states "I don't know if I smoked crack."

## 2014-02-03 NOTE — ED Provider Notes (Signed)
CSN: [id number]     Arrival date & time 02/03/14  1013 History  This chart was scribed for Nat Christen, MD by Vernell Barrier, ED scribe. This patient was seen in room APA07/APA07 and the patient's care was started at 10:35 AM.    Chief Complaint  Patient presents with  . Shortness of Breath  . Dizziness   The history is provided by the patient, a significant other and the spouse. No language interpreter was used.   HPI Comments: Cynthia Church is a 48 y.o. female wh/ hx of bipolar disease, manic depression, andt thyroid disorder presents to the Emergency Department complaining of SOB and chest pressure; onset 3 weeks ago. No hx of myocardial infarction. Also states she has stopped taking her thyroid medication because she doesn't like the way it makes her feel. Admits to constant alcohol consumption and cigarette use. States she drinks from "sun up till sun down." Power of attorney states she drinks a 12 pack daily. Has been to alcohol abuse programs with no relief.  Smoking 2-3 packs daily. Admits to cocaine usage yesterday. PCP used to be Dr. Cindie Laroche.   Also reports some right ankle pain; onset 1 week ago. States she has had an x-ray performed that showed no fracture or break.  Past Medical History  Diagnosis Date  . Mental disorder   . Bipolar affective disorder, manic 1993    Dx'd at [facility]  . Personality disorders 1993  . Hyperthyroidism 1993  . Headache(784.0) 09/25/2011  . Chronic pain   . COPD (chronic obstructive pulmonary disease)   . Alcoholism   . Cancer 1989   Past Surgical History  Procedure Laterality Date  . Back surgery    . Cauterization of uterine cancer  2008  . Breast surgery  2003    to check for possible cancer  . Tubal ligaation     Family History  Problem Relation Age of Onset  . Cirrhosis Father    History  Substance Use Topics  . Smoking status: Current Every Day Smoker -- 3.00 packs/day for 28 years    Types: Cigarettes  . Smokeless  tobacco: Not on file  . Alcohol Use: 98.4 oz/week    164 Cans of beer per week     Comment: daily 18 pack   OB History   Grav Para Term Preterm Abortions TAB SAB Ect Mult Living                 Review of Systems  Respiratory: Positive for shortness of breath.   Neurological: Positive for dizziness.    A complete 10 system review of systems was obtained and all systems are negative except as noted in the HPI and PMH.   Allergies  Review of patient's allergies indicates no known allergies.  Home Medications   Prior to Admission medications   Not on File   Triage vitals: BP 131/94  Pulse 93  Temp(Src) 98.1 F (36.7 C) (Oral)  Resp 16  Ht 5\' 2"  (1.575 m)  Wt 130 lb (58.968 kg)  BMI 23.77 kg/m2  SpO2 91%  Physical Exam  Nursing note and vitals reviewed. Constitutional: She is oriented to person, place, and time. She appears well-developed and well-nourished.  HENT:  Head: Normocephalic and atraumatic.  Eyes: Conjunctivae and EOM are normal. Pupils are equal, round, and reactive to light.  Neck: Normal range of motion. Neck supple.  Cardiovascular: Normal rate, regular rhythm and normal heart sounds.   Pulmonary/Chest: Effort normal and  breath sounds normal.  Abdominal: Soft. Bowel sounds are normal.  Musculoskeletal: Normal range of motion.  Neurological: She is alert and oriented to person, place, and time.  Skin: Skin is warm and dry.  Psychiatric: She has a normal mood and affect. Her behavior is normal.    ED Course  Procedures (including critical care time) DIAGNOSTIC STUDIES: Oxygen Saturation is 91% on room air, adequate by my interpretation.    COORDINATION OF CARE: At 10:42 AM: Discussed treatment plan with patient which includes cardiac workup.  Patient agrees.    Labs Review Labs Reviewed  BASIC METABOLIC PANEL - Abnormal; Notable for the following:    Sodium 136 (*)    BUN 3 (*)    All other components within normal limits  CBC WITH DIFFERENTIAL  - Abnormal; Notable for the following:    Hemoglobin 15.1 (*)    MCV 103.4 (*)    MCH 36.9 (*)    Lymphocytes Relative 47 (*)    All other components within normal limits  ETHANOL - Abnormal; Notable for the following:    Alcohol, Ethyl (B) 333 (*)    All other components within normal limits  TROPONIN I    Imaging Review Dg Chest 2 View  02/03/2014   CLINICAL DATA:  Short of breath and dizziness  EXAM: CHEST  2 VIEW  COMPARISON:  03/30/2013  FINDINGS: The heart size and mediastinal contours are within normal limits. Both lungs are clear. Chronic left rib fractures.  IMPRESSION: No active cardiopulmonary disease.   Electronically Signed   By: Franchot Gallo M.D.   On: 02/03/2014 11:52     EKG Interpretation   Date/Time:  Wednesday February 03 2014 10:43:29 EDT Ventricular Rate:  91 PR Interval:  199 QRS Duration: 81 QT Interval:  398 QTC Calculation: 490 R Axis:   67 Text Interpretation:  Sinus rhythm Borderline prolonged PR interval  Borderline prolonged QT interval Confirmed by Lacinda Axon  MD, Amandajo Gonder ([id number]) on  02/03/2014 11:26:35 AM      MDM   Final diagnoses:  Chest pain, unspecified chest pain type   patient left AMA prior to full evaluation. I was unable to talk to the patient prior to her departure.  She was hemodynamically stable when I did my history and physical  I personally performed the services described in this documentation, which was scribed in my presence. The recorded information has been reviewed and is accurate.     Nat Christen, MD 02/04/14 [phone]

## 2014-02-03 NOTE — ED Notes (Signed)
Patient walked, talked to charge nurse, and signed out AMA. EDP made aware.

## 2014-02-08 ENCOUNTER — Encounter (HOSPITAL_COMMUNITY): Payer: Self-pay | Admitting: Emergency Medicine

## 2014-02-08 ENCOUNTER — Emergency Department (HOSPITAL_COMMUNITY)
Admission: EM | Admit: 2014-02-08 | Discharge: 2014-02-08 | Discharge status: Against Medical Advice | Payer: Medicare Other | Attending: Emergency Medicine | Admitting: Emergency Medicine

## 2014-02-08 DIAGNOSIS — G8929 Other chronic pain: Secondary | ICD-10-CM | POA: Diagnosis not present

## 2014-02-08 DIAGNOSIS — F17200 Nicotine dependence, unspecified, uncomplicated: Secondary | ICD-10-CM | POA: Insufficient documentation

## 2014-02-08 DIAGNOSIS — J4489 Other specified chronic obstructive pulmonary disease: Secondary | ICD-10-CM | POA: Insufficient documentation

## 2014-02-08 DIAGNOSIS — J449 Chronic obstructive pulmonary disease, unspecified: Secondary | ICD-10-CM | POA: Diagnosis not present

## 2014-02-08 DIAGNOSIS — R42 Dizziness and giddiness: Secondary | ICD-10-CM | POA: Insufficient documentation

## 2014-02-08 NOTE — ED Notes (Signed)
Pt states she "wants to leave.  My nerves can't handle it."  Informed pt that edp has signed up to see her and it should not be too much longer.  Pt verbalized understanding but states, "I'm going to find a new doctor anyway."  Explained AMA form, pt verbalized understanding and signed.  Pt escorted out by family.

## 2014-02-08 NOTE — ED Notes (Signed)
Pt reports to the ED with dizziness "lasting a couple weeks." Pt reports having "a couple beers" this morning. Pt also reports smoking marijuana this morning.

## 2014-03-01 ENCOUNTER — Emergency Department (HOSPITAL_COMMUNITY)
Admission: EM | Admit: 2014-03-01 | Discharge: 2014-03-01 | Discharge status: Against Medical Advice | Payer: Medicare Other | Attending: Emergency Medicine | Admitting: Emergency Medicine

## 2014-03-01 ENCOUNTER — Encounter (HOSPITAL_COMMUNITY): Payer: Self-pay | Admitting: Emergency Medicine

## 2014-03-01 ENCOUNTER — Emergency Department (HOSPITAL_COMMUNITY): Payer: Medicare Other

## 2014-03-01 DIAGNOSIS — F1021 Alcohol dependence, in remission: Secondary | ICD-10-CM | POA: Diagnosis not present

## 2014-03-01 DIAGNOSIS — R5381 Other malaise: Secondary | ICD-10-CM | POA: Diagnosis present

## 2014-03-01 DIAGNOSIS — Z79899 Other long term (current) drug therapy: Secondary | ICD-10-CM | POA: Diagnosis not present

## 2014-03-01 DIAGNOSIS — G8929 Other chronic pain: Secondary | ICD-10-CM | POA: Insufficient documentation

## 2014-03-01 DIAGNOSIS — Z862 Personal history of diseases of the blood and blood-forming organs and certain disorders involving the immune mechanism: Secondary | ICD-10-CM | POA: Insufficient documentation

## 2014-03-01 DIAGNOSIS — F17200 Nicotine dependence, unspecified, uncomplicated: Secondary | ICD-10-CM | POA: Diagnosis not present

## 2014-03-01 DIAGNOSIS — F1210 Cannabis abuse, uncomplicated: Secondary | ICD-10-CM | POA: Insufficient documentation

## 2014-03-01 DIAGNOSIS — J449 Chronic obstructive pulmonary disease, unspecified: Secondary | ICD-10-CM | POA: Insufficient documentation

## 2014-03-01 DIAGNOSIS — R5383 Other fatigue: Secondary | ICD-10-CM | POA: Diagnosis present

## 2014-03-01 DIAGNOSIS — Z8639 Personal history of other endocrine, nutritional and metabolic disease: Secondary | ICD-10-CM | POA: Diagnosis not present

## 2014-03-01 DIAGNOSIS — J4489 Other specified chronic obstructive pulmonary disease: Secondary | ICD-10-CM | POA: Insufficient documentation

## 2014-03-01 DIAGNOSIS — Z8659 Personal history of other mental and behavioral disorders: Secondary | ICD-10-CM | POA: Insufficient documentation

## 2014-03-01 DIAGNOSIS — Z859 Personal history of malignant neoplasm, unspecified: Secondary | ICD-10-CM | POA: Diagnosis not present

## 2014-03-01 DIAGNOSIS — R55 Syncope and collapse: Secondary | ICD-10-CM | POA: Diagnosis not present

## 2014-03-01 LAB — CBC WITH DIFFERENTIAL/PLATELET
Basophils Absolute: 0 10*3/uL (ref 0.0–0.1)
Basophils Relative: 1 % (ref 0–1)
Eosinophils Absolute: 0 10*3/uL (ref 0.0–0.7)
Eosinophils Relative: 1 % (ref 0–5)
HEMATOCRIT: 42.1 % (ref 36.0–46.0)
Hemoglobin: 14.9 g/dL (ref 12.0–15.0)
LYMPHS ABS: 2.3 10*3/uL (ref 0.7–4.0)
Lymphocytes Relative: 38 % (ref 12–46)
MCH: 36.6 pg — AB (ref 26.0–34.0)
MCHC: 35.4 g/dL (ref 30.0–36.0)
MCV: 103.4 fL — ABNORMAL HIGH (ref 78.0–100.0)
MONO ABS: 0.4 10*3/uL (ref 0.1–1.0)
Monocytes Relative: 7 % (ref 3–12)
NEUTROS ABS: 3.3 10*3/uL (ref 1.7–7.7)
NEUTROS PCT: 55 % (ref 43–77)
Platelets: 164 10*3/uL (ref 150–400)
RBC: 4.07 MIL/uL (ref 3.87–5.11)
RDW: 14.1 % (ref 11.5–15.5)
WBC: 6.1 10*3/uL (ref 4.0–10.5)

## 2014-03-01 LAB — URINALYSIS, ROUTINE W REFLEX MICROSCOPIC
BILIRUBIN URINE: NEGATIVE
Glucose, UA: NEGATIVE mg/dL
Hgb urine dipstick: NEGATIVE
Ketones, ur: NEGATIVE mg/dL
Leukocytes, UA: NEGATIVE
Nitrite: NEGATIVE
PROTEIN: NEGATIVE mg/dL
Specific Gravity, Urine: <1.005 — ABNORMAL LOW (ref 1.005–1.030)
UROBILINOGEN UA: 0.2 mg/dL (ref 0.0–1.0)
pH: 5.5 (ref 5.0–8.0)

## 2014-03-01 LAB — BASIC METABOLIC PANEL
Anion gap: 20 — ABNORMAL HIGH (ref 5–15)
BUN: 5 mg/dL — AB (ref 6–23)
CHLORIDE: 98 meq/L (ref 96–112)
CO2: 18 mEq/L — ABNORMAL LOW (ref 19–32)
CREATININE: 0.67 mg/dL (ref 0.50–1.10)
Calcium: 8.5 mg/dL (ref 8.4–10.5)
GFR calc Af Amer: >90 mL/min (ref >90)
GFR calc non Af Amer: >90 mL/min (ref >90)
GLUCOSE: 74 mg/dL (ref 70–99)
Potassium: 4.3 mEq/L (ref 3.7–5.3)
Sodium: 136 mEq/L — ABNORMAL LOW (ref 137–147)

## 2014-03-01 LAB — ETHANOL: Alcohol, Ethyl (B): 329 mg/dL — ABNORMAL HIGH (ref 0–11)

## 2014-03-01 LAB — SALICYLATE LEVEL: Salicylate Lvl: <2 mg/dL — ABNORMAL LOW (ref 2.8–20.0)

## 2014-03-01 LAB — RAPID URINE DRUG SCREEN, HOSP PERFORMED
Amphetamines: NOT DETECTED
BARBITURATES: NOT DETECTED
Benzodiazepines: NOT DETECTED
COCAINE: NOT DETECTED
OPIATES: NOT DETECTED
TETRAHYDROCANNABINOL: POSITIVE — AB

## 2014-03-01 LAB — CK: CK TOTAL: 134 U/L (ref 7–177)

## 2014-03-01 LAB — ACETAMINOPHEN LEVEL: Acetaminophen (Tylenol), Serum: <15 ug/mL (ref 10–30)

## 2014-03-01 LAB — T4, FREE: Free T4: 0.48 ng/dL — ABNORMAL LOW (ref 0.80–1.80)

## 2014-03-01 MED ORDER — SODIUM CHLORIDE 0.9 % IV BOLUS (SEPSIS): 1000.0000 mL | Freq: Once | INTRAVENOUS | Status: DC

## 2014-03-01 MED ORDER — ONDANSETRON HCL 4 MG/2ML IJ SOLN: 4.0000 mg | Freq: Once | INTRAMUSCULAR | Status: DC

## 2014-03-01 NOTE — ED Notes (Signed)
EMS reports pt had been walking approx 1 mile when became weak [place]  Pt's boyfriend notified someone at the church to call EMS for heat exhaustion.  EMS arrived to find pt diaphoretic, cbg 69, bp 150/100, HR 105.  PT reports is an alcoholic and has not eaten in 1 week.  Pt says when she eats she vomits.

## 2014-03-01 NOTE — ED Provider Notes (Signed)
CSN: [id number]     Arrival date & time 03/01/14  1129 History  This chart was scribed for Ezequiel Essex, MD by Elby Beck, ED Scribe. This patient was seen in room APA04/APA04 and the patient's care was started at 1:09 PM.   Chief Complaint  Patient presents with  . Fatigue    The history is provided by the patient and the EMS personnel. No language interpreter was used.    HPI Comments: Cynthia Church is a 48 y.o. female brought by EMS to the Emergency Department complaining of intermittent dizziness onset today. She states that she is a heavy alcoholic and that she has personality disorder and bipolar disorder. She states that she was walking around in the heat today for 2-3 hours and she thinks this caused her to overheat. She also thinks that she could be dehydrated. She states that she has been having ongoing episodes of emesis every time after she eats. She states that she last had a BM 3-4 days ago. She denies syncope, falls, head injury, headache, chest pain or any other symptoms. She denies any history of DM. She states that she has not been eating or drinking well for the past week. She states that she has not been taking her prescribed medications. She states that she smoker marijuana and that she last used crack cocaine 2 days ago.    Past Medical History  Diagnosis Date  . Mental disorder   . Bipolar affective disorder, manic 1993    Dx'd at [facility]  . Personality disorders 1993  . Hyperthyroidism 1993  . Headache(784.0) 09/25/2011  . Chronic pain   . COPD (chronic obstructive pulmonary disease)   . Alcoholism   . Cancer 1989   Past Surgical History  Procedure Laterality Date  . Back surgery    . Cauterization of uterine cancer  2008  . Breast surgery  2003    to check for possible cancer  . Tubal ligaation     Family History  Problem Relation Age of Onset  . Cirrhosis Father    History  Substance Use Topics  . Smoking status: Current Every Day Smoker  -- 3.00 packs/day for 28 years    Types: Cigarettes  . Smokeless tobacco: Not on file  . Alcohol Use: 98.4 oz/week    164 Cans of beer per week     Comment: daily 18 pack   OB History   Grav Para Term Preterm Abortions TAB SAB Ect Mult Living                 Review of Systems A complete 10 system review of systems was obtained and all systems are negative except as noted in the HPI and PMH.   Allergies  Advil  Home Medications   Prior to Admission medications   Medication Sig Start Date End Date Taking? Authorizing Provider  diphenhydrAMINE (BENADRYL) 25 MG tablet Take 25 mg by mouth every 6 (six) hours as needed for itching.   Yes Historical Provider, MD  loperamide (IMODIUM A-D) 2 MG tablet Take 2 mg by mouth 4 (four) times daily as needed for diarrhea or loose stools.   Yes Historical Provider, MD  nabumetone (RELAFEN) 750 MG tablet Take 750 mg by mouth 2 (two) times daily.   Yes Historical Provider, MD  PARoxetine (PAXIL) 20 MG tablet Take 20 mg by mouth daily.   Yes Historical Provider, MD   Triage Vitals: BP 145/98  Pulse 61  Temp(Src) 98.1 F (  36.7 C) (Oral)  Resp 16  SpO2 94%  Physical Exam  Nursing note and vitals reviewed. Constitutional: She is oriented to person, place, and time. She appears well-developed and well-nourished. No distress.  HENT:  Head: Normocephalic and atraumatic.  Mouth/Throat: Oropharynx is clear and moist. No oropharyngeal exudate.  Eyes: Conjunctivae and EOM are normal. Pupils are equal, round, and reactive to light.  Neck: Normal range of motion. Neck supple.  No meningismus. No C-spine pain  Cardiovascular: Normal rate, regular rhythm, normal heart sounds and intact distal pulses.   No murmur heard. Pulmonary/Chest: Effort normal and breath sounds normal. No respiratory distress.  Abdominal: Soft. There is tenderness. There is no rebound and no guarding.  Mild left sided abdominal pain tenderness  Musculoskeletal: Normal range of  motion. She exhibits no edema and no tenderness.  No evidence of trauma  Neurological: She is alert and oriented to person, place, and time. No cranial nerve deficit. She exhibits normal muscle tone. Coordination normal.  No ataxia on finger to nose bilaterally. No pronator drift. 5/5 strength throughout. CN 2-12 intact. Negative Romberg. Equal grip strength. Sensation intact. Gait is normal.   Skin: Skin is warm.  Psychiatric: She has a normal mood and affect. Her behavior is normal.    ED Course  Procedures (including critical care time)  DIAGNOSTIC STUDIES: Oxygen Saturation is 94% on RA, adequate by my interpretation.    COORDINATION OF CARE: 1:15 PM- Pt was informed of the plan for treatment and she suddenly walked out of the room and left against medical advice.   Labs Review Labs Reviewed  CBC WITH DIFFERENTIAL - Abnormal; Notable for the following:    MCV 103.4 (*)    MCH 36.6 (*)    All other components within normal limits  BASIC METABOLIC PANEL - Abnormal; Notable for the following:    Sodium 136 (*)    CO2 18 (*)    BUN 5 (*)    Anion gap 20 (*)    All other components within normal limits  ETHANOL - Abnormal; Notable for the following:    Alcohol, Ethyl (B) 329 (*)    All other components within normal limits  URINE RAPID DRUG SCREEN (HOSP PERFORMED) - Abnormal; Notable for the following:    Tetrahydrocannabinol POSITIVE (*)    All other components within normal limits  URINALYSIS, ROUTINE W REFLEX MICROSCOPIC - Abnormal; Notable for the following:    APPearance CLOUDY (*)    Specific Gravity, Urine <1.005 (*)    All other components within normal limits  SALICYLATE LEVEL - Abnormal; Notable for the following:    Salicylate Lvl <7.8 (*)    All other components within normal limits  T4, FREE - Abnormal; Notable for the following:    Free T4 0.48 (*)    All other components within normal limits  CK  ACETAMINOPHEN LEVEL  TSH  PREGNANCY, URINE    Imaging  Review No results found.   EKG Interpretation   Date/Time:  Monday March 01 2014 11:43:26 EDT Ventricular Rate:  89 PR Interval:  182 QRS Duration: 82 QT Interval:  400 QTC Calculation: 487 R Axis:   72 Text Interpretation:  Sinus rhythm Borderline prolonged QT interval No  significant change was found Confirmed by Wyvonnia Dusky  MD, Jaydn Moscato [phone]) on  03/01/2014 1:16:04 PM      MDM   Final diagnoses:  Near syncope  Near syncopal episode while walking in the heat.  No LOC, did not hit head.  Neuro intact.  EKG unchanged. Vitals stable Ethanol 329. Anion gap 20 with metabolic acidosis. CK normal.  Patient states she is leaving and not staying for further evaluation.  Though she is intoxicated, she is alert and oriented x3 and able to ambulate. Patient appears capable of making her own medical decisions. She understands she is leaving [place] as her evaluation is not complete. She may be at risk for having heart attack, stroke, recurrent syncope, serious injury, electrolyte imbalance, kidney failure, or other abnormality.  She has capacity to make her own medical decisions and is oriented x3 despite her intoxication. She will leave [place].   I personally performed the services described in this documentation, which was scribed in my presence. The recorded information has been reviewed and is accurate.   Ezequiel Essex, MD 03/01/14 2034

## 2014-03-01 NOTE — ED Notes (Signed)
Pt pulled out IV, cath intact.  Pt requesting to leave, states feels better.  Informed pt that the doctor would be in when the blood work resulted.  Pt agreed to stay.

## 2014-03-01 NOTE — ED Notes (Signed)
Pt refused futher evaluation.

## 2014-03-02 LAB — TSH: TSH: 31.44 u[IU]/mL — ABNORMAL HIGH (ref 0.350–4.500)

## 2014-03-10 ENCOUNTER — Emergency Department (HOSPITAL_COMMUNITY)
Admission: EM | Admit: 2014-03-10 | Discharge: 2014-03-10 | Discharge status: Against Medical Advice | Payer: Medicare Other | Attending: Emergency Medicine | Admitting: Emergency Medicine

## 2014-03-10 ENCOUNTER — Ambulatory Visit (HOSPITAL_COMMUNITY): Admission: RE | Admit: 2014-03-10 | Payer: Medicare Other | Source: Ambulatory Visit

## 2014-03-10 ENCOUNTER — Emergency Department (HOSPITAL_COMMUNITY): Payer: Medicare Other

## 2014-03-10 ENCOUNTER — Encounter (HOSPITAL_COMMUNITY): Payer: Self-pay | Admitting: Emergency Medicine

## 2014-03-10 DIAGNOSIS — Z8542 Personal history of malignant neoplasm of other parts of uterus: Secondary | ICD-10-CM | POA: Insufficient documentation

## 2014-03-10 DIAGNOSIS — Z8639 Personal history of other endocrine, nutritional and metabolic disease: Secondary | ICD-10-CM | POA: Diagnosis not present

## 2014-03-10 DIAGNOSIS — F1010 Alcohol abuse, uncomplicated: Secondary | ICD-10-CM | POA: Insufficient documentation

## 2014-03-10 DIAGNOSIS — F17200 Nicotine dependence, unspecified, uncomplicated: Secondary | ICD-10-CM | POA: Diagnosis not present

## 2014-03-10 DIAGNOSIS — Z79899 Other long term (current) drug therapy: Secondary | ICD-10-CM | POA: Insufficient documentation

## 2014-03-10 DIAGNOSIS — F1021 Alcohol dependence, in remission: Secondary | ICD-10-CM | POA: Diagnosis not present

## 2014-03-10 DIAGNOSIS — R079 Chest pain, unspecified: Secondary | ICD-10-CM | POA: Insufficient documentation

## 2014-03-10 DIAGNOSIS — Z862 Personal history of diseases of the blood and blood-forming organs and certain disorders involving the immune mechanism: Secondary | ICD-10-CM | POA: Diagnosis not present

## 2014-03-10 DIAGNOSIS — R209 Unspecified disturbances of skin sensation: Secondary | ICD-10-CM | POA: Insufficient documentation

## 2014-03-10 DIAGNOSIS — G8929 Other chronic pain: Secondary | ICD-10-CM | POA: Insufficient documentation

## 2014-03-10 DIAGNOSIS — J449 Chronic obstructive pulmonary disease, unspecified: Secondary | ICD-10-CM | POA: Insufficient documentation

## 2014-03-10 DIAGNOSIS — Z791 Long term (current) use of non-steroidal anti-inflammatories (NSAID): Secondary | ICD-10-CM | POA: Diagnosis not present

## 2014-03-10 DIAGNOSIS — F3110 Bipolar disorder, current episode manic without psychotic features, unspecified: Secondary | ICD-10-CM | POA: Insufficient documentation

## 2014-03-10 DIAGNOSIS — J4489 Other specified chronic obstructive pulmonary disease: Secondary | ICD-10-CM | POA: Insufficient documentation

## 2014-03-10 LAB — URINALYSIS, ROUTINE W REFLEX MICROSCOPIC
Bilirubin Urine: NEGATIVE
GLUCOSE, UA: NEGATIVE mg/dL
Hgb urine dipstick: NEGATIVE
KETONES UR: NEGATIVE mg/dL
Leukocytes, UA: NEGATIVE
Nitrite: NEGATIVE
PROTEIN: NEGATIVE mg/dL
Specific Gravity, Urine: <1.005 — ABNORMAL LOW (ref 1.005–1.030)
UROBILINOGEN UA: 0.2 mg/dL (ref 0.0–1.0)
pH: 5.5 (ref 5.0–8.0)

## 2014-03-10 LAB — CBC WITH DIFFERENTIAL/PLATELET
Basophils Absolute: 0 10*3/uL (ref 0.0–0.1)
Basophils Relative: 1 % (ref 0–1)
EOS ABS: 0 10*3/uL (ref 0.0–0.7)
Eosinophils Relative: 1 % (ref 0–5)
HEMATOCRIT: 42.7 % (ref 36.0–46.0)
Hemoglobin: 15.3 g/dL — ABNORMAL HIGH (ref 12.0–15.0)
LYMPHS ABS: 2.3 10*3/uL (ref 0.7–4.0)
LYMPHS PCT: 53 % — AB (ref 12–46)
MCH: 37 pg — ABNORMAL HIGH (ref 26.0–34.0)
MCHC: 35.8 g/dL (ref 30.0–36.0)
MCV: 103.1 fL — ABNORMAL HIGH (ref 78.0–100.0)
Monocytes Absolute: 0.4 10*3/uL (ref 0.1–1.0)
Monocytes Relative: 8 % (ref 3–12)
Neutro Abs: 1.6 10*3/uL — ABNORMAL LOW (ref 1.7–7.7)
Neutrophils Relative %: 37 % — ABNORMAL LOW (ref 43–77)
PLATELETS: 164 10*3/uL (ref 150–400)
RBC: 4.14 MIL/uL (ref 3.87–5.11)
RDW: 13.5 % (ref 11.5–15.5)
WBC: 4.3 10*3/uL (ref 4.0–10.5)

## 2014-03-10 LAB — COMPREHENSIVE METABOLIC PANEL
ALT: 58 U/L — AB (ref 0–35)
ANION GAP: 20 — AB (ref 5–15)
AST: 207 U/L — ABNORMAL HIGH (ref 0–37)
Albumin: 4 g/dL (ref 3.5–5.2)
Alkaline Phosphatase: 76 U/L (ref 39–117)
BUN: 3 mg/dL — AB (ref 6–23)
CO2: 20 meq/L (ref 19–32)
Calcium: 8.8 mg/dL (ref 8.4–10.5)
Chloride: 95 mEq/L — ABNORMAL LOW (ref 96–112)
Creatinine, Ser: 0.57 mg/dL (ref 0.50–1.10)
GLUCOSE: 94 mg/dL (ref 70–99)
Potassium: 3.8 mEq/L (ref 3.7–5.3)
SODIUM: 135 meq/L — AB (ref 137–147)
TOTAL PROTEIN: 7.8 g/dL (ref 6.0–8.3)
Total Bilirubin: 0.6 mg/dL (ref 0.3–1.2)

## 2014-03-10 LAB — TROPONIN I: Troponin I: <0.3 ng/mL (ref <0.30)

## 2014-03-10 LAB — ETHANOL: Alcohol, Ethyl (B): 298 mg/dL — ABNORMAL HIGH (ref 0–11)

## 2014-03-10 MED ORDER — SODIUM CHLORIDE 0.9 % IV BOLUS (SEPSIS)
1000.0000 mL | Freq: Once | INTRAVENOUS | Status: AC
  Administered 2014-03-10: 1000 mL via INTRAVENOUS
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
## 2014-03-29 ENCOUNTER — Emergency Department (HOSPITAL_COMMUNITY)
Admission: EM | Admit: 2014-03-29 | Discharge: 2014-03-29 | Discharge status: Against Medical Advice | Payer: Medicare Other | Attending: Emergency Medicine | Admitting: Emergency Medicine

## 2014-03-29 ENCOUNTER — Encounter (HOSPITAL_COMMUNITY): Payer: Self-pay | Admitting: Emergency Medicine

## 2014-03-29 DIAGNOSIS — G8929 Other chronic pain: Secondary | ICD-10-CM | POA: Diagnosis not present

## 2014-03-29 DIAGNOSIS — J449 Chronic obstructive pulmonary disease, unspecified: Secondary | ICD-10-CM | POA: Diagnosis not present

## 2014-03-29 DIAGNOSIS — R0602 Shortness of breath: Secondary | ICD-10-CM | POA: Insufficient documentation

## 2014-03-29 DIAGNOSIS — F411 Generalized anxiety disorder: Secondary | ICD-10-CM | POA: Diagnosis not present

## 2014-03-29 DIAGNOSIS — R109 Unspecified abdominal pain: Secondary | ICD-10-CM | POA: Insufficient documentation

## 2014-03-29 DIAGNOSIS — J4489 Other specified chronic obstructive pulmonary disease: Secondary | ICD-10-CM | POA: Insufficient documentation

## 2014-03-29 DIAGNOSIS — F17200 Nicotine dependence, unspecified, uncomplicated: Secondary | ICD-10-CM | POA: Diagnosis not present

## 2014-03-29 NOTE — ED Notes (Signed)
Called pt in lobby again. Pt still not in lobby

## 2014-03-29 NOTE — ED Notes (Signed)
Called pt in lobby again. Pt still not in lobby.
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
## 2014-03-29 NOTE — ED Notes (Signed)
Pt not found in waiting area when called back to treatment area.

## 2014-03-29 NOTE — ED Notes (Signed)
Pt called for triage x1! No answer °

## 2014-03-29 NOTE — ED Notes (Signed)
Went back to lobby to pull pt to room pt was not in lobby.

## 2014-03-29 NOTE — ED Notes (Signed)
Pt reports abd pain with loss of appetite, shortness of breath and anxiety x 1 week

## 2014-03-30 ENCOUNTER — Emergency Department (HOSPITAL_COMMUNITY)
Admission: EM | Admit: 2014-03-30 | Discharge: 2014-03-30 | Discharge status: Against Medical Advice | Payer: Medicare Other

## 2014-04-14 ENCOUNTER — Emergency Department (HOSPITAL_COMMUNITY): Payer: Medicare Other

## 2014-04-14 ENCOUNTER — Emergency Department (HOSPITAL_COMMUNITY)
Admission: EM | Admit: 2014-04-14 | Discharge: 2014-04-14 | Disposition: A | Discharge status: Home / Self Care | Payer: Medicare Other | Attending: Emergency Medicine | Admitting: Emergency Medicine

## 2014-04-14 ENCOUNTER — Encounter (HOSPITAL_COMMUNITY): Payer: Self-pay | Admitting: Emergency Medicine

## 2014-04-14 DIAGNOSIS — S0003XA Contusion of scalp, initial encounter: Secondary | ICD-10-CM | POA: Diagnosis not present

## 2014-04-14 DIAGNOSIS — S40029A Contusion of unspecified upper arm, initial encounter: Secondary | ICD-10-CM | POA: Diagnosis not present

## 2014-04-14 DIAGNOSIS — Z8659 Personal history of other mental and behavioral disorders: Secondary | ICD-10-CM | POA: Diagnosis not present

## 2014-04-14 DIAGNOSIS — G8929 Other chronic pain: Secondary | ICD-10-CM | POA: Diagnosis not present

## 2014-04-14 DIAGNOSIS — Z8542 Personal history of malignant neoplasm of other parts of uterus: Secondary | ICD-10-CM | POA: Insufficient documentation

## 2014-04-14 DIAGNOSIS — S1093XA Contusion of unspecified part of neck, initial encounter: Secondary | ICD-10-CM

## 2014-04-14 DIAGNOSIS — J449 Chronic obstructive pulmonary disease, unspecified: Secondary | ICD-10-CM | POA: Diagnosis not present

## 2014-04-14 DIAGNOSIS — Z8639 Personal history of other endocrine, nutritional and metabolic disease: Secondary | ICD-10-CM | POA: Diagnosis not present

## 2014-04-14 DIAGNOSIS — S0083XA Contusion of other part of head, initial encounter: Secondary | ICD-10-CM | POA: Insufficient documentation

## 2014-04-14 DIAGNOSIS — S0993XA Unspecified injury of face, initial encounter: Secondary | ICD-10-CM | POA: Insufficient documentation

## 2014-04-14 DIAGNOSIS — S298XXA Other specified injuries of thorax, initial encounter: Secondary | ICD-10-CM | POA: Insufficient documentation

## 2014-04-14 DIAGNOSIS — F17200 Nicotine dependence, unspecified, uncomplicated: Secondary | ICD-10-CM | POA: Insufficient documentation

## 2014-04-14 DIAGNOSIS — S3981XA Other specified injuries of abdomen, initial encounter: Secondary | ICD-10-CM | POA: Diagnosis not present

## 2014-04-14 DIAGNOSIS — S199XXA Unspecified injury of neck, initial encounter: Principal | ICD-10-CM

## 2014-04-14 DIAGNOSIS — Z862 Personal history of diseases of the blood and blood-forming organs and certain disorders involving the immune mechanism: Secondary | ICD-10-CM | POA: Insufficient documentation

## 2014-04-14 DIAGNOSIS — J4489 Other specified chronic obstructive pulmonary disease: Secondary | ICD-10-CM | POA: Insufficient documentation

## 2014-04-14 LAB — CBC WITH DIFFERENTIAL/PLATELET
BASOS ABS: 0 10*3/uL (ref 0.0–0.1)
BASOS PCT: 1 % (ref 0–1)
EOS ABS: 0 10*3/uL (ref 0.0–0.7)
Eosinophils Relative: 0 % (ref 0–5)
HCT: 43.3 % (ref 36.0–46.0)
Hemoglobin: 15.6 g/dL — ABNORMAL HIGH (ref 12.0–15.0)
Lymphocytes Relative: 32 % (ref 12–46)
Lymphs Abs: 1.8 10*3/uL (ref 0.7–4.0)
MCH: 37.8 pg — AB (ref 26.0–34.0)
MCHC: 36 g/dL (ref 30.0–36.0)
MCV: 104.8 fL — AB (ref 78.0–100.0)
MONO ABS: 0.6 10*3/uL (ref 0.1–1.0)
Monocytes Relative: 10 % (ref 3–12)
Neutro Abs: 3.2 10*3/uL (ref 1.7–7.7)
Neutrophils Relative %: 57 % (ref 43–77)
PLATELETS: 161 10*3/uL (ref 150–400)
RBC: 4.13 MIL/uL (ref 3.87–5.11)
RDW: 13.5 % (ref 11.5–15.5)
WBC: 5.7 10*3/uL (ref 4.0–10.5)

## 2014-04-14 LAB — COMPREHENSIVE METABOLIC PANEL
ALT: 34 U/L (ref 0–35)
ANION GAP: 18 — AB (ref 5–15)
AST: 170 U/L — ABNORMAL HIGH (ref 0–37)
Albumin: 3.8 g/dL (ref 3.5–5.2)
Alkaline Phosphatase: 83 U/L (ref 39–117)
BILIRUBIN TOTAL: 0.6 mg/dL (ref 0.3–1.2)
BUN: 3 mg/dL — AB (ref 6–23)
CHLORIDE: 104 meq/L (ref 96–112)
CO2: 20 mEq/L (ref 19–32)
Calcium: 8.8 mg/dL (ref 8.4–10.5)
Creatinine, Ser: 0.55 mg/dL (ref 0.50–1.10)
GFR calc Af Amer: >90 mL/min (ref >90)
GFR calc non Af Amer: >90 mL/min (ref >90)
GLUCOSE: 91 mg/dL (ref 70–99)
Potassium: 4.4 mEq/L (ref 3.7–5.3)
Sodium: 142 mEq/L (ref 137–147)
Total Protein: 7.5 g/dL (ref 6.0–8.3)

## 2014-04-14 LAB — ETHANOL: Alcohol, Ethyl (B): 268 mg/dL — ABNORMAL HIGH (ref 0–11)

## 2014-04-14 MED ORDER — HYDROCODONE-ACETAMINOPHEN 5-325 MG PO TABS: 1.0000 | ORAL_TABLET | Freq: Four times a day (QID) | ORAL | Status: DC | PRN

## 2014-04-14 MED ORDER — IOHEXOL 300 MG/ML  SOLN
80.0000 mL | Freq: Once | INTRAMUSCULAR | Status: AC | PRN
  Administered 2014-04-14: 80 mL via INTRAVENOUS

## 2014-04-14 NOTE — ED Notes (Signed)
Pt does not want to stay to speak with social worker, POA here to take pt home.
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
## 2014-04-14 NOTE — ED Notes (Signed)
Pt states she has had 3 beers this morning.
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
## 2014-04-14 NOTE — Discharge Instructions (Signed)
Follow up with your family md next week for recheck °
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
## 2014-04-14 NOTE — ED Notes (Signed)
Pt alert & oriented x4, stable gait. Patient given discharge instructions, paperwork & prescription(s). Patient  instructed to stop at the registration desk to finish any additional paperwork. Patient verbalized understanding. Pt left department w/ no further questions. 

## 2014-05-28 ENCOUNTER — Encounter (HOSPITAL_COMMUNITY): Payer: Self-pay | Admitting: Emergency Medicine

## 2014-05-28 ENCOUNTER — Emergency Department (HOSPITAL_COMMUNITY)
Admission: EM | Admit: 2014-05-28 | Discharge: 2014-05-28 | Discharge status: Against Medical Advice | Payer: Medicare Other | Attending: Emergency Medicine | Admitting: Emergency Medicine

## 2014-05-28 DIAGNOSIS — Z720 Tobacco use: Secondary | ICD-10-CM | POA: Insufficient documentation

## 2014-05-28 DIAGNOSIS — G8929 Other chronic pain: Secondary | ICD-10-CM | POA: Insufficient documentation

## 2014-05-28 DIAGNOSIS — R51 Headache: Secondary | ICD-10-CM | POA: Diagnosis present

## 2014-05-28 DIAGNOSIS — J449 Chronic obstructive pulmonary disease, unspecified: Secondary | ICD-10-CM | POA: Insufficient documentation

## 2014-05-28 NOTE — ED Notes (Signed)
Pt reports headache since drinking alcohol today. Pt reports "i drank 2-3 beers today and smoked a joint yesterday." pt alert. Speech clear. Pt reports light and sound sensitivity. nad noted.

## 2014-05-28 NOTE — ED Notes (Addendum)
Pt registration completed and pt reported to registration staff," I am going to walk home." Pt ambulated to bathroom in ED waiting room.

## 2014-05-28 NOTE — ED Notes (Signed)
Pt ambulated out of ED per registration staff.

## 2014-06-01 ENCOUNTER — Emergency Department (HOSPITAL_COMMUNITY)
Admission: EM | Admit: 2014-06-01 | Discharge: 2014-06-01 | Disposition: A | Discharge status: Home / Self Care | Payer: Medicare Other

## 2014-06-01 NOTE — ED Notes (Signed)
Pt called x 3 no answer 

## 2014-06-01 NOTE — ED Notes (Signed)
Unable to locate

## 2014-06-01 NOTE — ED Notes (Signed)
No answer

## 2014-08-10 ENCOUNTER — Emergency Department (HOSPITAL_COMMUNITY)
Admission: EM | Admit: 2014-08-10 | Discharge: 2014-08-10 | Discharge status: Against Medical Advice | Payer: Medicare Other | Attending: Emergency Medicine | Admitting: Emergency Medicine

## 2014-08-10 ENCOUNTER — Emergency Department (HOSPITAL_COMMUNITY): Payer: Medicare Other

## 2014-08-10 ENCOUNTER — Encounter (HOSPITAL_COMMUNITY): Payer: Self-pay

## 2014-08-10 DIAGNOSIS — R109 Unspecified abdominal pain: Secondary | ICD-10-CM | POA: Insufficient documentation

## 2014-08-10 DIAGNOSIS — H539 Unspecified visual disturbance: Secondary | ICD-10-CM | POA: Insufficient documentation

## 2014-08-10 DIAGNOSIS — M549 Dorsalgia, unspecified: Secondary | ICD-10-CM | POA: Insufficient documentation

## 2014-08-10 DIAGNOSIS — G8929 Other chronic pain: Secondary | ICD-10-CM | POA: Diagnosis not present

## 2014-08-10 DIAGNOSIS — Z8659 Personal history of other mental and behavioral disorders: Secondary | ICD-10-CM | POA: Diagnosis not present

## 2014-08-10 DIAGNOSIS — J441 Chronic obstructive pulmonary disease with (acute) exacerbation: Secondary | ICD-10-CM | POA: Diagnosis not present

## 2014-08-10 DIAGNOSIS — Z79899 Other long term (current) drug therapy: Secondary | ICD-10-CM | POA: Insufficient documentation

## 2014-08-10 DIAGNOSIS — Z720 Tobacco use: Secondary | ICD-10-CM | POA: Insufficient documentation

## 2014-08-10 DIAGNOSIS — Z8542 Personal history of malignant neoplasm of other parts of uterus: Secondary | ICD-10-CM | POA: Insufficient documentation

## 2014-08-10 DIAGNOSIS — R0602 Shortness of breath: Secondary | ICD-10-CM

## 2014-08-10 DIAGNOSIS — R079 Chest pain, unspecified: Secondary | ICD-10-CM | POA: Diagnosis present

## 2014-08-10 DIAGNOSIS — Z8639 Personal history of other endocrine, nutritional and metabolic disease: Secondary | ICD-10-CM | POA: Insufficient documentation

## 2014-08-10 LAB — CBC WITH DIFFERENTIAL/PLATELET
BASOS PCT: 1 % (ref 0–1)
Basophils Absolute: 0 10*3/uL (ref 0.0–0.1)
EOS ABS: 0.1 10*3/uL (ref 0.0–0.7)
Eosinophils Relative: 1 % (ref 0–5)
HCT: 42.8 % (ref 36.0–46.0)
HEMOGLOBIN: 14.5 g/dL (ref 12.0–15.0)
Lymphocytes Relative: 46 % (ref 12–46)
Lymphs Abs: 3.3 10*3/uL (ref 0.7–4.0)
MCH: 36.1 pg — AB (ref 26.0–34.0)
MCHC: 33.9 g/dL (ref 30.0–36.0)
MCV: 106.5 fL — ABNORMAL HIGH (ref 78.0–100.0)
MONOS PCT: 9 % (ref 3–12)
Monocytes Absolute: 0.6 10*3/uL (ref 0.1–1.0)
NEUTROS ABS: 3 10*3/uL (ref 1.7–7.7)
NEUTROS PCT: 43 % (ref 43–77)
Platelets: 236 10*3/uL (ref 150–400)
RBC: 4.02 MIL/uL (ref 3.87–5.11)
RDW: 12.5 % (ref 11.5–15.5)
WBC: 7 10*3/uL (ref 4.0–10.5)

## 2014-08-10 LAB — BASIC METABOLIC PANEL
ANION GAP: 9 (ref 5–15)
BUN: 8 mg/dL (ref 6–23)
CO2: 23 mmol/L (ref 19–32)
Calcium: 8.8 mg/dL (ref 8.4–10.5)
Chloride: 104 mEq/L (ref 96–112)
Creatinine, Ser: 0.57 mg/dL (ref 0.50–1.10)
Glucose, Bld: 92 mg/dL (ref 70–99)
POTASSIUM: 4 mmol/L (ref 3.5–5.1)
Sodium: 136 mmol/L (ref 135–145)

## 2014-08-10 LAB — ETHANOL: ALCOHOL ETHYL (B): 369 mg/dL — AB (ref 0–9)

## 2014-08-10 LAB — TROPONIN I: Troponin I: <0.03 ng/mL (ref <0.031)

## 2014-08-10 MED ORDER — IPRATROPIUM-ALBUTEROL 0.5-2.5 (3) MG/3ML IN SOLN
3.0000 mL | Freq: Once | RESPIRATORY_TRACT | Status: AC
  Administered 2014-08-10: 3 mL via RESPIRATORY_TRACT
  Filled 2014-08-10: qty 3

## 2014-08-10 NOTE — ED Provider Notes (Signed)
CSN: [id number]     Arrival date & time 08/10/14  1305 History  This chart was scribed for Fredia Sorrow, MD by Dellis Filbert, ED Scribe. The patient was seen in [place] and the patient's care was started at 1:43 PM.  Chief Complaint  Patient presents with  . Shortness of Breath   Patient is a 48 y.o. female presenting with shortness of breath. The history is provided by the patient. No language interpreter was used.  Shortness of Breath Severity:  Mild Onset quality:  Sudden Duration:  2 hours Timing:  Constant Progression:  Improving Chronicity:  Chronic Associated symptoms: abdominal pain, chest pain and fever   Associated symptoms: no headaches, no rash and no vomiting    HPI Comments: Cynthia Church is a 48 y.o. female brought in by ambulance, who presents to the Emergency Department complaining of  SOB, onset today. EMS was called and pt was given a breathing treatment. Since treatment her symptoms have improved. Pt also complains of non-radiating, sharp substernal CP rated 10/10, onset 1 week ago. Pt notes having ongoing back pain.  Past Medical History  Diagnosis Date  . Mental disorder   . Bipolar affective disorder, manic 1993    Dx'd at [facility]  . Personality disorders 1993  . Hyperthyroidism 1993  . Headache(784.0) 09/25/2011  . Chronic pain   . COPD (chronic obstructive pulmonary disease)   . Alcoholism   . Cancer 1989    uterine   Past Surgical History  Procedure Laterality Date  . Back surgery    . Cauterization of uterine cancer  2008  . Breast surgery  2003    to check for possible cancer  . Tubal ligaation     Family History  Problem Relation Age of Onset  . Cirrhosis Father    History  Substance Use Topics  . Smoking status: Current Every Day Smoker -- 3.00 packs/day for 28 years    Types: Cigarettes  . Smokeless tobacco: Not on file  . Alcohol Use: Yes     Comment: daily 18 pack   OB History    No data available     Review  of Systems  Constitutional: Positive for fever and chills.  HENT: Positive for congestion and rhinorrhea.   Eyes: Positive for visual disturbance.  Respiratory: Positive for shortness of breath.   Cardiovascular: Positive for chest pain. Negative for leg swelling.  Gastrointestinal: Positive for abdominal pain. Negative for nausea, vomiting and diarrhea.  Genitourinary: Negative for dysuria and hematuria.  Musculoskeletal: Positive for back pain.  Skin: Negative for rash.  Neurological: Negative for headaches.  Hematological: Does not bruise/bleed easily.  Psychiatric/Behavioral: Negative for confusion.    Allergies  Advil  Home Medications   Prior to Admission medications   Medication Sig Start Date End Date Taking? Authorizing Provider  HYDROcodone-acetaminophen (NORCO/VICODIN) 5-325 MG per tablet Take 1 tablet by mouth every 6 (six) hours as needed. 04/14/14   Maudry Diego, MD  ibuprofen (ADVIL,MOTRIN) 200 MG tablet Take 400 mg by mouth every 6 (six) hours as needed for moderate pain.    Historical Provider, MD   BP 134/95 mmHg  Pulse 77  Temp(Src) 97.5 F (36.4 C) (Oral)  Wt 140 lb (63.504 kg)  SpO2 21% Physical Exam  Constitutional: She is oriented to person, place, and time. She appears well-developed and well-nourished. No distress.  HENT:  Head: Normocephalic and atraumatic.  Mouth/Throat: Oropharynx is clear and moist.  Eyes: Conjunctivae and EOM are  normal. Pupils are equal, round, and reactive to light. No scleral icterus.  Pupils 5 mm but intact.  Neck: Normal range of motion.  Cardiovascular: Normal rate, regular rhythm and normal heart sounds.   No murmur heard. Pulmonary/Chest: Effort normal and breath sounds normal. She has no wheezes.  Lungs clear bilaterally.  Abdominal: Bowel sounds are normal. There is no tenderness.  Musculoskeletal:  No swelling in the ankles  Neurological: She is alert and oriented to person, place, and time. No cranial nerve  deficit. She exhibits normal muscle tone. Coordination normal.  Skin: Skin is warm and dry.  Psychiatric: She has a normal mood and affect. Her behavior is normal.  Nursing note and vitals reviewed.   ED Course  Procedures  DIAGNOSTIC STUDIES: Oxygen Saturation is 21% on room air, low by my interpretation.    COORDINATION OF CARE: 1:51 PM Discussed treatment plan with pt at bedside and pt agreed to plan.  Results for orders placed or performed during the hospital encounter of 04/14/14  CBC with Differential  Result Value Ref Range   WBC 5.7 4.0 - 10.5 K/uL   RBC 4.13 3.87 - 5.11 MIL/uL   Hemoglobin 15.6 (H) 12.0 - 15.0 g/dL   HCT 43.3 36.0 - 46.0 %   MCV 104.8 (H) 78.0 - 100.0 fL   MCH 37.8 (H) 26.0 - 34.0 pg   MCHC 36.0 30.0 - 36.0 g/dL   RDW 13.5 11.5 - 15.5 %   Platelets 161 150 - 400 K/uL   Neutrophils Relative % 57 43 - 77 %   Neutro Abs 3.2 1.7 - 7.7 K/uL   Lymphocytes Relative 32 12 - 46 %   Lymphs Abs 1.8 0.7 - 4.0 K/uL   Monocytes Relative 10 3 - 12 %   Monocytes Absolute 0.6 0.1 - 1.0 K/uL   Eosinophils Relative 0 0 - 5 %   Eosinophils Absolute 0.0 0.0 - 0.7 K/uL   Basophils Relative 1 0 - 1 %   Basophils Absolute 0.0 0.0 - 0.1 K/uL  Comprehensive metabolic panel  Result Value Ref Range   Sodium 142 137 - 147 mEq/L   Potassium 4.4 3.7 - 5.3 mEq/L   Chloride 104 96 - 112 mEq/L   CO2 20 19 - 32 mEq/L   Glucose, Bld 91 70 - 99 mg/dL   BUN 3 (L) 6 - 23 mg/dL   Creatinine, Ser 0.55 0.50 - 1.10 mg/dL   Calcium 8.8 8.4 - 10.5 mg/dL   Total Protein 7.5 6.0 - 8.3 g/dL   Albumin 3.8 3.5 - 5.2 g/dL   AST 170 (H) 0 - 37 U/L   ALT 34 0 - 35 U/L   Alkaline Phosphatase 83 39 - 117 U/L   Total Bilirubin 0.6 0.3 - 1.2 mg/dL   GFR calc non Af Amer >90 >90 mL/min   GFR calc Af Amer >90 >90 mL/min   Anion gap 18 (H) 5 - 15  Ethanol  Result Value Ref Range   Alcohol, Ethyl (B) 268 (H) 0 - 11 mg/dL   No results found. Medications - No data to display   EKG  Interpretation None      MDM   Final diagnoses:  SOB (shortness of breath)    Patient ended up leaving the AMA after she had been seen. Labs were still pending. Patient provided no warning.   I personally performed the services described in this documentation, which was scribed in my presence. The recorded information has been  reviewed and is accurate.      Fredia Sorrow, MD 08/10/14 [phone]

## 2014-08-10 NOTE — ED Notes (Signed)
RT paged.

## 2014-08-10 NOTE — ED Notes (Signed)
EMS reports was called out for SOB.  Reports rr 22/min and 02 sat 98%.  REPorts sob started 2 hours ago while smoking " a joint" and drinking a beer.  Pt says feels like she is going to faint.

## 2014-08-10 NOTE — ED Notes (Signed)
Per ED secretary pt left after being seen by provider. Prior to leaving pt came out to nurses station and reported "i want to go smoke and drink a beer." pt informed could not do that and was taken back to room. Agricultural consultant and EDP aware. No new orders given.

## 2014-09-03 ENCOUNTER — Encounter (HOSPITAL_COMMUNITY): Payer: Self-pay | Admitting: Emergency Medicine

## 2014-09-03 ENCOUNTER — Emergency Department (HOSPITAL_COMMUNITY)
Admission: EM | Admit: 2014-09-03 | Discharge: 2014-09-03 | Disposition: A | Discharge status: Home / Self Care | Payer: Medicare Other | Attending: Emergency Medicine | Admitting: Emergency Medicine

## 2014-09-03 DIAGNOSIS — Z9114 Patient's other noncompliance with medication regimen: Secondary | ICD-10-CM | POA: Insufficient documentation

## 2014-09-03 DIAGNOSIS — Z8639 Personal history of other endocrine, nutritional and metabolic disease: Secondary | ICD-10-CM | POA: Insufficient documentation

## 2014-09-03 DIAGNOSIS — Z008 Encounter for other general examination: Secondary | ICD-10-CM | POA: Diagnosis present

## 2014-09-03 DIAGNOSIS — Z720 Tobacco use: Secondary | ICD-10-CM | POA: Diagnosis not present

## 2014-09-03 DIAGNOSIS — J449 Chronic obstructive pulmonary disease, unspecified: Secondary | ICD-10-CM | POA: Insufficient documentation

## 2014-09-03 DIAGNOSIS — F1210 Cannabis abuse, uncomplicated: Secondary | ICD-10-CM | POA: Diagnosis not present

## 2014-09-03 DIAGNOSIS — F419 Anxiety disorder, unspecified: Secondary | ICD-10-CM

## 2014-09-03 DIAGNOSIS — G8929 Other chronic pain: Secondary | ICD-10-CM | POA: Insufficient documentation

## 2014-09-03 DIAGNOSIS — Z8542 Personal history of malignant neoplasm of other parts of uterus: Secondary | ICD-10-CM | POA: Diagnosis not present

## 2014-09-03 DIAGNOSIS — F1010 Alcohol abuse, uncomplicated: Secondary | ICD-10-CM | POA: Diagnosis not present

## 2014-09-03 LAB — RAPID URINE DRUG SCREEN, HOSP PERFORMED
Amphetamines: NOT DETECTED
Barbiturates: NOT DETECTED
Benzodiazepines: NOT DETECTED
Cocaine: NOT DETECTED
Opiates: NOT DETECTED
Tetrahydrocannabinol: POSITIVE — AB

## 2014-09-03 LAB — CBC WITH DIFFERENTIAL/PLATELET
BASOS ABS: 0 10*3/uL (ref 0.0–0.1)
Basophils Relative: 1 % (ref 0–1)
EOS PCT: 1 % (ref 0–5)
Eosinophils Absolute: 0 10*3/uL (ref 0.0–0.7)
HCT: 46 % (ref 36.0–46.0)
Hemoglobin: 15.1 g/dL — ABNORMAL HIGH (ref 12.0–15.0)
Lymphocytes Relative: 53 % — ABNORMAL HIGH (ref 12–46)
Lymphs Abs: 3.2 10*3/uL (ref 0.7–4.0)
MCH: 36.6 pg — ABNORMAL HIGH (ref 26.0–34.0)
MCHC: 32.8 g/dL (ref 30.0–36.0)
MCV: 111.4 fL — AB (ref 78.0–100.0)
MONO ABS: 0.4 10*3/uL (ref 0.1–1.0)
Monocytes Relative: 7 % (ref 3–12)
NEUTROS PCT: 40 % — AB (ref 43–77)
Neutro Abs: 2.4 10*3/uL (ref 1.7–7.7)
PLATELETS: 206 10*3/uL (ref 150–400)
RBC: 4.13 MIL/uL (ref 3.87–5.11)
RDW: 13.3 % (ref 11.5–15.5)
WBC: 6.1 10*3/uL (ref 4.0–10.5)

## 2014-09-03 LAB — COMPREHENSIVE METABOLIC PANEL
ALT: 31 U/L (ref 0–35)
AST: 77 U/L — ABNORMAL HIGH (ref 0–37)
Albumin: 4.7 g/dL (ref 3.5–5.2)
Alkaline Phosphatase: 72 U/L (ref 39–117)
Anion gap: 12 (ref 5–15)
BILIRUBIN TOTAL: 0.6 mg/dL (ref 0.3–1.2)
BUN: 5 mg/dL — AB (ref 6–23)
CO2: 19 mmol/L (ref 19–32)
CREATININE: 0.63 mg/dL (ref 0.50–1.10)
Calcium: 8.9 mg/dL (ref 8.4–10.5)
Chloride: 102 mmol/L (ref 96–112)
GLUCOSE: 100 mg/dL — AB (ref 70–99)
Potassium: 3.8 mmol/L (ref 3.5–5.1)
Sodium: 133 mmol/L — ABNORMAL LOW (ref 135–145)
Total Protein: 7.9 g/dL (ref 6.0–8.3)

## 2014-09-03 LAB — SALICYLATE LEVEL: Salicylate Lvl: <4 mg/dL (ref 2.8–20.0)

## 2014-09-03 LAB — ETHANOL: Alcohol, Ethyl (B): 318 mg/dL — ABNORMAL HIGH (ref 0–9)

## 2014-09-03 NOTE — ED Notes (Signed)
Pt reports she "called the cops to come pick me up because I want to kill everybody and I'm suicidal." Pt denies plan to hurt herself. No homicidal plan.

## 2014-09-03 NOTE — ED Provider Notes (Signed)
CSN: [id number]     Arrival date & time 09/03/14  1540 History  This chart was scribed for Hoy Morn, MD by Edison Simon, ED Scribe. This patient was seen in room APA16A/APA16A and the patient's care was started at 3:58 PM.    Chief Complaint  Patient presents with  . V70.1   The history is provided by the patient. No language interpreter was used.    HPI Comments: Cynthia Church is a 49 y.o. female who presents to the Emergency Department complaining of vague suicidal ideation and anxiety. She states she feels like she cannot breathe. She states she abuses drugs and alcohol and notes history of manic depressive bipolar disorder and personality disorders. She states she has been having anxiety and suicidal ideation recently due to stress from having "2 jobs, 2 men, and a cat." She denies suicide plan. She states she used to have similar anxiety problems but had improved for some time. She states she does not have a PCP and is not currently taking any medications for her mental health, but states she used to. She is unsure what she took, but thinks Valium was one of them. She states she has used alcohol today.  PCP: no PCP, per patient, but used to see Dr. Cindie Laroche  Past Medical History  Diagnosis Date  . Mental disorder   . Bipolar affective disorder, manic 1993    Dx'd at [facility]  . Personality disorders 1993  . Hyperthyroidism 1993  . Headache(784.0) 09/25/2011  . Chronic pain   . COPD (chronic obstructive pulmonary disease)   . Alcoholism   . Cancer 1989    uterine   Past Surgical History  Procedure Laterality Date  . Back surgery    . Cauterization of uterine cancer  2008  . Breast surgery  2003    to check for possible cancer  . Tubal ligaation     Family History  Problem Relation Age of Onset  . Cirrhosis Father    History  Substance Use Topics  . Smoking status: Current Every Day Smoker -- 3.00 packs/day for 28 years    Types: Cigarettes  . Smokeless  tobacco: Not on file  . Alcohol Use: Yes     Comment: daily 18 pack   OB History    No data available     Review of Systems A complete 10 system review of systems was obtained and all systems are negative except as noted in the HPI and PMH.    Allergies  Advil  Home Medications   Prior to Admission medications   Medication Sig Start Date End Date Taking? Authorizing Provider  ibuprofen (ADVIL,MOTRIN) 200 MG tablet Take 400 mg by mouth every 6 (six) hours as needed for moderate pain.   Yes Historical Provider, MD  HYDROcodone-acetaminophen (NORCO/VICODIN) 5-325 MG per tablet Take 1 tablet by mouth every 6 (six) hours as needed. Patient not taking: Reported on 09/03/2014 04/14/14   Maudry Diego, MD   BP 150/110 mmHg  Pulse 91  Temp(Src) 98.2 F (36.8 C) (Oral)  Resp 18  Ht 5\' 3"  (1.6 m)  Wt 112 lb (50.803 kg)  BMI 19.84 kg/m2  SpO2 100% Physical Exam  Constitutional: She is oriented to person, place, and time. She appears well-developed and well-nourished. No distress.  HENT:  Head: Normocephalic and atraumatic.  Eyes: EOM are normal.  Neck: Normal range of motion.  Cardiovascular: Normal rate, regular rhythm and normal heart sounds.   Pulmonary/Chest: Effort  normal and breath sounds normal.  Abdominal: Soft. She exhibits no distension. There is no tenderness.  Musculoskeletal: Normal range of motion.  Neurological: She is alert and oriented to person, place, and time.  Skin: Skin is warm and dry.  Psychiatric: Judgment normal.  Nursing note and vitals reviewed.   ED Course  Procedures (including critical care time)  DIAGNOSTIC STUDIES: Oxygen Saturation is 100% on room air, normal by my interpretation.    COORDINATION OF CARE: 4:05 PM Discussed treatment plan with patient at beside, the patient agrees with the plan and has no further questions at this time.   Labs Review Labs Reviewed - No data to display  Imaging Review No results found.   EKG  Interpretation None      MDM   Final diagnoses:  Alcohol abuse  Anxiety  H/O medication noncompliance   The patient has a long-standing history of alcohol abuse.  I do not believe that she truly has suicidal thoughts.  She states that time she just becomes anxious and she becomes tired of her living situation at home.  She has no suicidal plan.  I do not believe the patient is a threat to herself or others at this time.  After being given food in the emergency department she would like to go home at this time.  I recommended that the patient follow-up with her mental health team and that if she is unable to find a mental health team that will provide her the assistance she needs that she may need to look in [facility].  I've asked that the patient return to the ER for any new or worsening symptoms.  At this time the patient states she has no suicidal or homicidal thoughts.  She would like to go home.    I personally performed the services described in this documentation, which was scribed in my presence. The recorded information has been reviewed and is accurate.      Hoy Morn, MD 09/03/14 [phone]

## 2014-09-03 NOTE — ED Notes (Signed)
MD at bedside. 

## 2014-09-03 NOTE — ED Notes (Signed)
PT told this nurse she was leaving. ER MD made aware and stated he would do her paperwork and denied SI.

## 2014-10-04 ENCOUNTER — Emergency Department (HOSPITAL_COMMUNITY): Payer: Medicare Other

## 2014-10-04 ENCOUNTER — Encounter (HOSPITAL_COMMUNITY): Payer: Self-pay | Admitting: Emergency Medicine

## 2014-10-04 ENCOUNTER — Emergency Department (HOSPITAL_COMMUNITY)
Admission: EM | Admit: 2014-10-04 | Discharge: 2014-10-04 | Discharge status: Against Medical Advice | Payer: Medicare Other | Attending: Emergency Medicine | Admitting: Emergency Medicine

## 2014-10-04 DIAGNOSIS — R51 Headache: Secondary | ICD-10-CM | POA: Insufficient documentation

## 2014-10-04 DIAGNOSIS — Z9889 Other specified postprocedural states: Secondary | ICD-10-CM | POA: Diagnosis not present

## 2014-10-04 DIAGNOSIS — Z8542 Personal history of malignant neoplasm of other parts of uterus: Secondary | ICD-10-CM | POA: Insufficient documentation

## 2014-10-04 DIAGNOSIS — Z79899 Other long term (current) drug therapy: Secondary | ICD-10-CM | POA: Diagnosis not present

## 2014-10-04 DIAGNOSIS — H538 Other visual disturbances: Secondary | ICD-10-CM | POA: Diagnosis not present

## 2014-10-04 DIAGNOSIS — R112 Nausea with vomiting, unspecified: Secondary | ICD-10-CM | POA: Insufficient documentation

## 2014-10-04 DIAGNOSIS — R1013 Epigastric pain: Secondary | ICD-10-CM | POA: Insufficient documentation

## 2014-10-04 DIAGNOSIS — M545 Low back pain, unspecified: Secondary | ICD-10-CM

## 2014-10-04 DIAGNOSIS — J029 Acute pharyngitis, unspecified: Secondary | ICD-10-CM | POA: Diagnosis not present

## 2014-10-04 DIAGNOSIS — J449 Chronic obstructive pulmonary disease, unspecified: Secondary | ICD-10-CM | POA: Insufficient documentation

## 2014-10-04 DIAGNOSIS — G8929 Other chronic pain: Secondary | ICD-10-CM | POA: Diagnosis not present

## 2014-10-04 DIAGNOSIS — R197 Diarrhea, unspecified: Secondary | ICD-10-CM | POA: Diagnosis not present

## 2014-10-04 DIAGNOSIS — Z720 Tobacco use: Secondary | ICD-10-CM | POA: Diagnosis not present

## 2014-10-04 DIAGNOSIS — F10120 Alcohol abuse with intoxication, uncomplicated: Secondary | ICD-10-CM | POA: Insufficient documentation

## 2014-10-04 DIAGNOSIS — Z8639 Personal history of other endocrine, nutritional and metabolic disease: Secondary | ICD-10-CM | POA: Diagnosis not present

## 2014-10-04 DIAGNOSIS — F10920 Alcohol use, unspecified with intoxication, uncomplicated: Secondary | ICD-10-CM

## 2014-10-04 DIAGNOSIS — Z8659 Personal history of other mental and behavioral disorders: Secondary | ICD-10-CM | POA: Diagnosis not present

## 2014-10-04 DIAGNOSIS — F10129 Alcohol abuse with intoxication, unspecified: Secondary | ICD-10-CM | POA: Diagnosis present

## 2014-10-04 LAB — CBC WITH DIFFERENTIAL/PLATELET
BASOS ABS: 0 10*3/uL (ref 0.0–0.1)
Basophils Relative: 0 % (ref 0–1)
EOS ABS: 0 10*3/uL (ref 0.0–0.7)
Eosinophils Relative: 1 % (ref 0–5)
HEMATOCRIT: 41.8 % (ref 36.0–46.0)
HEMOGLOBIN: 14.2 g/dL (ref 12.0–15.0)
Lymphocytes Relative: 57 % — ABNORMAL HIGH (ref 12–46)
Lymphs Abs: 3 10*3/uL (ref 0.7–4.0)
MCH: 35.3 pg — ABNORMAL HIGH (ref 26.0–34.0)
MCHC: 34 g/dL (ref 30.0–36.0)
MCV: 104 fL — AB (ref 78.0–100.0)
MONO ABS: 0.4 10*3/uL (ref 0.1–1.0)
MONOS PCT: 8 % (ref 3–12)
Neutro Abs: 1.8 10*3/uL (ref 1.7–7.7)
Neutrophils Relative %: 34 % — ABNORMAL LOW (ref 43–77)
PLATELETS: 226 10*3/uL (ref 150–400)
RBC: 4.02 MIL/uL (ref 3.87–5.11)
RDW: 12.1 % (ref 11.5–15.5)
WBC: 5.3 10*3/uL (ref 4.0–10.5)

## 2014-10-04 LAB — COMPREHENSIVE METABOLIC PANEL
ALT: 46 U/L — ABNORMAL HIGH (ref 0–35)
ANION GAP: 8 (ref 5–15)
AST: 100 U/L — AB (ref 0–37)
Albumin: 4.4 g/dL (ref 3.5–5.2)
Alkaline Phosphatase: 65 U/L (ref 39–117)
BUN: 5 mg/dL — AB (ref 6–23)
CO2: 24 mmol/L (ref 19–32)
Calcium: 8.4 mg/dL (ref 8.4–10.5)
Chloride: 105 mmol/L (ref 96–112)
Creatinine, Ser: 0.59 mg/dL (ref 0.50–1.10)
GFR calc Af Amer: >90 mL/min (ref >90)
GFR calc non Af Amer: >90 mL/min (ref >90)
Glucose, Bld: 87 mg/dL (ref 70–99)
Potassium: 3.9 mmol/L (ref 3.5–5.1)
SODIUM: 137 mmol/L (ref 135–145)
TOTAL PROTEIN: 7.7 g/dL (ref 6.0–8.3)
Total Bilirubin: 0.5 mg/dL (ref 0.3–1.2)

## 2014-10-04 LAB — RAPID URINE DRUG SCREEN, HOSP PERFORMED
Amphetamines: NOT DETECTED
BENZODIAZEPINES: NOT DETECTED
Barbiturates: NOT DETECTED
Cocaine: NOT DETECTED
OPIATES: NOT DETECTED
TETRAHYDROCANNABINOL: POSITIVE — AB

## 2014-10-04 LAB — LIPASE, BLOOD: Lipase: 34 U/L (ref 11–59)

## 2014-10-04 LAB — ETHANOL: Alcohol, Ethyl (B): 276 mg/dL — ABNORMAL HIGH (ref 0–9)

## 2014-10-04 NOTE — ED Notes (Signed)
EDP at bedside  

## 2014-10-04 NOTE — ED Notes (Signed)
Pt brought in by officer who states that pt has been drinking today and wants to kill everyone in sight because they have gotten on her nerves.  Also states that she wants to harm herself.

## 2014-10-04 NOTE — ED Notes (Signed)
Pt began wandering halls and reporting " i want to leave." EDP aware. Pt still denies SI/HI. EDP reported if pt leaves it will be an elopement. Pt ambulated out of ED with steady gait.

## 2014-10-04 NOTE — ED Notes (Signed)
Lab at bedside

## 2014-10-04 NOTE — ED Provider Notes (Signed)
CSN: [id number]     Arrival date & time 10/04/14  1417 History  This chart was scribed for Fredia Sorrow, MD by Stephania Fragmin, ED Scribe. This patient was seen in room APAH8/APAH8 and the patient's care was started at 2:53 PM.    Chief Complaint  Patient presents with  . Psychiatric Evaluation   The history is provided by the patient and the police. No language interpreter was used.     HPI Comments: Cynthia Church is a 49 y.o. female who presents to the Emergency Department for a psychiatric evaluation. Per nursing notes, patient was brought in by officer due to stating SI and HI and intoxication. However, when I discussed this with patient, she denied any SI or HI, stating that she didn't mean what she said. Patient does mention a chief complaint of severe back pain that has been bothering her for several days. She complains of associated chills, visual changes, cough, rhinorrhea, sore throat, chest pain, SOB, abdominal pain, nausea, vomiting, diarrhea, and headache. She has a history of back surgery, but denies any known trauma or injury. Patient has taken pain medication for this and has pain patches in place on her back. She admits to being a drug addict. She denies fevers, dysuria, hematuria, leg swelling, bleeding easily, or rash.    Past Medical History  Diagnosis Date  . Mental disorder   . Bipolar affective disorder, manic 1993    Dx'd at [facility]  . Personality disorders 1993  . Hyperthyroidism 1993  . Headache(784.0) 09/25/2011  . Chronic pain   . COPD (chronic obstructive pulmonary disease)   . Alcoholism   . Cancer 1989    uterine   Past Surgical History  Procedure Laterality Date  . Back surgery    . Cauterization of uterine cancer  2008  . Breast surgery  2003    to check for possible cancer  . Tubal ligaation     Family History  Problem Relation Age of Onset  . Cirrhosis Father    History  Substance Use Topics  . Smoking status: Current Every Day Smoker --  3.00 packs/day for 28 years    Types: Cigarettes  . Smokeless tobacco: Not on file  . Alcohol Use: Yes     Comment: daily 18 pack   OB History    No data available     Review of Systems  Constitutional: Positive for chills. Negative for fever.  HENT: Positive for rhinorrhea and sore throat.   Eyes: Positive for visual disturbance.  Respiratory: Positive for cough and shortness of breath.   Cardiovascular: Positive for chest pain. Negative for leg swelling.  Gastrointestinal: Positive for nausea, vomiting, abdominal pain and diarrhea.  Genitourinary: Negative for dysuria.  Musculoskeletal: Negative for myalgias and back pain.  Skin: Negative for rash.  Neurological: Positive for headaches.  Hematological: Does not bruise/bleed easily.      Allergies  Advil  Home Medications   Prior to Admission medications   Medication Sig Start Date End Date Taking? Authorizing Provider  HYDROcodone-acetaminophen (NORCO/VICODIN) 5-325 MG per tablet Take 1 tablet by mouth every 6 (six) hours as needed. Patient not taking: Reported on 09/03/2014 04/14/14   Maudry Diego, MD  ibuprofen (ADVIL,MOTRIN) 200 MG tablet Take 400 mg by mouth every 6 (six) hours as needed for moderate pain.    Historical Provider, MD   BP 152/135 mmHg  Pulse 81  Temp(Src) 97.6 F (36.4 C) (Oral)  Resp 22  Ht 5' (1.524  m)  Wt 116 lb (52.617 kg)  BMI 22.65 kg/m2  SpO2 100% Physical Exam  Constitutional: She is oriented to person, place, and time. She appears well-developed and well-nourished. No distress.  HENT:  Head: Normocephalic and atraumatic.  Mouth/Throat: Oropharynx is clear and moist.  Moist mucous membranes.  Eyes: Conjunctivae and EOM are normal. Pupils are equal, round, and reactive to light.  Neck: Neck supple. No tracheal deviation present.  Cardiovascular: Normal rate, regular rhythm and normal heart sounds.   Pulmonary/Chest: Effort normal and breath sounds normal. No respiratory distress. She  has no wheezes. She has no rales.  Abdominal: Soft. Bowel sounds are normal. She exhibits no distension. There is tenderness (Tenderness to epigastrium.).  Musculoskeletal: Normal range of motion. She exhibits no edema.  Patches on right and mid back. No bruises or scrapes noted to back.  Neurological: She is alert and oriented to person, place, and time.  Skin: Skin is warm and dry.  Psychiatric: She has a normal mood and affect. Her behavior is normal.  Nursing note and vitals reviewed.   ED Course  Procedures (including critical care time)  DIAGNOSTIC STUDIES: Oxygen Saturation is 100% on room air, normal by my interpretation.    Labs Review Labs Reviewed  URINE RAPID DRUG SCREEN (HOSP PERFORMED) - Abnormal; Notable for the following:    Tetrahydrocannabinol POSITIVE (*)    All other components within normal limits  COMPREHENSIVE METABOLIC PANEL - Abnormal; Notable for the following:    BUN 5 (*)    AST 100 (*)    ALT 46 (*)    All other components within normal limits  CBC WITH DIFFERENTIAL/PLATELET - Abnormal; Notable for the following:    MCV 104.0 (*)    MCH 35.3 (*)    Neutrophils Relative % 34 (*)    Lymphocytes Relative 57 (*)    All other components within normal limits  LIPASE, BLOOD  ETHANOL   Results for orders placed or performed during the hospital encounter of 10/04/14  Urine rapid drug screen (hosp performed)  Result Value Ref Range   Opiates NONE DETECTED NONE DETECTED   Cocaine NONE DETECTED NONE DETECTED   Benzodiazepines NONE DETECTED NONE DETECTED   Amphetamines NONE DETECTED NONE DETECTED   Tetrahydrocannabinol POSITIVE (A) NONE DETECTED   Barbiturates NONE DETECTED NONE DETECTED  Comprehensive metabolic panel  Result Value Ref Range   Sodium 137 135 - 145 mmol/L   Potassium 3.9 3.5 - 5.1 mmol/L   Chloride 105 96 - 112 mmol/L   CO2 24 19 - 32 mmol/L   Glucose, Bld 87 70 - 99 mg/dL   BUN 5 (L) 6 - 23 mg/dL   Creatinine, Ser 0.59 0.50 - 1.10  mg/dL   Calcium 8.4 8.4 - 10.5 mg/dL   Total Protein 7.7 6.0 - 8.3 g/dL   Albumin 4.4 3.5 - 5.2 g/dL   AST 100 (H) 0 - 37 U/L   ALT 46 (H) 0 - 35 U/L   Alkaline Phosphatase 65 39 - 117 U/L   Total Bilirubin 0.5 0.3 - 1.2 mg/dL   GFR calc non Af Amer >90 >90 mL/min   GFR calc Af Amer >90 >90 mL/min   Anion gap 8 5 - 15  Lipase, blood  Result Value Ref Range   Lipase 34 11 - 59 U/L  CBC with Differential/Platelet  Result Value Ref Range   WBC 5.3 4.0 - 10.5 K/uL   RBC 4.02 3.87 - 5.11 MIL/uL   Hemoglobin  14.2 12.0 - 15.0 g/dL   HCT 41.8 36.0 - 46.0 %   MCV 104.0 (H) 78.0 - 100.0 fL   MCH 35.3 (H) 26.0 - 34.0 pg   MCHC 34.0 30.0 - 36.0 g/dL   RDW 12.1 11.5 - 15.5 %   Platelets 226 150 - 400 K/uL   Neutrophils Relative % 34 (L) 43 - 77 %   Neutro Abs 1.8 1.7 - 7.7 K/uL   Lymphocytes Relative 57 (H) 12 - 46 %   Lymphs Abs 3.0 0.7 - 4.0 K/uL   Monocytes Relative 8 3 - 12 %   Monocytes Absolute 0.4 0.1 - 1.0 K/uL   Eosinophils Relative 1 0 - 5 %   Eosinophils Absolute 0.0 0.0 - 0.7 K/uL   Basophils Relative 0 0 - 1 %   Basophils Absolute 0.0 0.0 - 0.1 K/uL     Imaging Review Dg Lumbar Spine Complete  10/04/2014   CLINICAL DATA:  Low back pain with right-sided radicular symptoms  EXAM: LUMBAR SPINE - COMPLETE 4+ VIEW  COMPARISON:  None.  FINDINGS: Frontal, lateral, spot lumbosacral lateral, and bilateral oblique views were obtained. There are 5 non-rib-bearing lumbar type vertebral bodies. There is mild levoscoliosis. There is no fracture or spondylolisthesis. There is fairly marked disc space narrowing at L5-S1. There is mild disc space narrowing at L2-3, L3-4, and L4-5. There is facet osteoarthritic change at L4-5 and L5-S1 bilaterally.  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 Slight scoliosis. No fracture or spondylolisthesis.   Electronically Signed   By: Lowella Grip III M.D.   On: 10/04/2014 16:00     EKG Interpretation None      MDM    Final diagnoses:  Bilateral low back pain without sciatica  Alcohol intoxication, uncomplicated   Patient brought in by a IT consultant and then the left. Patient was not brought in on a hold. Patient's main complaint was low back pain. Says been there for several days. Getting worse. Patient got involved with the police because she told the officer that she been drinking today and she wanted to kill everyone in site is a were getting on her nerves. She also told him that she wanted to kill herself. Patient denied all of that here. Do not feel patient was suicidal or homicidal. Plan workup of the back. Patient did admit to drinking alcohol and to using drugs. Urine drug screen positive for THC. Alcohol level not resulted. X-ray of the back shows area of osteoarthritic changes mostly in the L5-S1 area. Patient had no neuro focal deficits in the legs. Patient did not need to be placed on a psychiatric hold. Patient ended up eloping. Essentially left AMA but did not notify anybody. She left prior to these results being back.  I personally performed the services described in this documentation, which was scribed in my presence. The recorded information has been reviewed and is accurate.       Fredia Sorrow, MD 10/04/14 [phone]

## 2014-11-16 DIAGNOSIS — J449 Chronic obstructive pulmonary disease, unspecified: Secondary | ICD-10-CM | POA: Diagnosis not present

## 2014-11-16 DIAGNOSIS — R51 Headache: Secondary | ICD-10-CM | POA: Diagnosis present

## 2014-11-16 DIAGNOSIS — G8929 Other chronic pain: Secondary | ICD-10-CM | POA: Insufficient documentation

## 2014-11-16 DIAGNOSIS — Z720 Tobacco use: Secondary | ICD-10-CM | POA: Diagnosis not present

## 2014-11-17 ENCOUNTER — Encounter (HOSPITAL_COMMUNITY): Payer: Self-pay | Admitting: *Deleted

## 2014-11-17 ENCOUNTER — Emergency Department (HOSPITAL_COMMUNITY)
Admission: EM | Admit: 2014-11-17 | Discharge: 2014-11-17 | Discharge status: Against Medical Advice | Payer: Medicare Other | Attending: Emergency Medicine | Admitting: Emergency Medicine

## 2014-11-17 NOTE — ED Notes (Signed)
Dr. Tomi Bamberger went into to assess patient and patient states she was leaving. Pt left without signing discharge summary.

## 2014-11-17 NOTE — ED Notes (Signed)
Pt has multiple complaints; pt is c/o abdominal pain, headache and back ache;

## 2014-11-17 NOTE — ED Provider Notes (Signed)
[name] was initiated and I personally evaluated the patient and placed orders (if any) at  12:40 AM on November 17, 2014.  I entered the room to see this patient and she was standing up putting on her clothes stating she was leaving.  She was ambulatory without difficulty.    Rolland Porter, MD, Barbette Or, MD 11/17/14 [phone]

## 2015-03-22 ENCOUNTER — Encounter (HOSPITAL_COMMUNITY): Payer: Self-pay | Admitting: *Deleted

## 2015-03-22 ENCOUNTER — Emergency Department (HOSPITAL_COMMUNITY)
Admission: EM | Admit: 2015-03-22 | Discharge: 2015-03-22 | Discharge status: Against Medical Advice | Payer: Medicare Other | Attending: Emergency Medicine | Admitting: Emergency Medicine

## 2015-03-22 DIAGNOSIS — S99912A Unspecified injury of left ankle, initial encounter: Secondary | ICD-10-CM | POA: Insufficient documentation

## 2015-03-22 DIAGNOSIS — Z720 Tobacco use: Secondary | ICD-10-CM | POA: Insufficient documentation

## 2015-03-22 DIAGNOSIS — J449 Chronic obstructive pulmonary disease, unspecified: Secondary | ICD-10-CM | POA: Diagnosis not present

## 2015-03-22 DIAGNOSIS — S0990XA Unspecified injury of head, initial encounter: Secondary | ICD-10-CM | POA: Insufficient documentation

## 2015-03-22 DIAGNOSIS — Y999 Unspecified external cause status: Secondary | ICD-10-CM | POA: Insufficient documentation

## 2015-03-22 DIAGNOSIS — G8929 Other chronic pain: Secondary | ICD-10-CM | POA: Insufficient documentation

## 2015-03-22 DIAGNOSIS — Y939 Activity, unspecified: Secondary | ICD-10-CM | POA: Insufficient documentation

## 2015-03-22 DIAGNOSIS — Y929 Unspecified place or not applicable: Secondary | ICD-10-CM | POA: Diagnosis not present
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
## 2015-03-23 ENCOUNTER — Encounter (HOSPITAL_COMMUNITY): Payer: Self-pay | Admitting: Emergency Medicine

## 2015-03-23 ENCOUNTER — Emergency Department (HOSPITAL_COMMUNITY)
Admission: EM | Admit: 2015-03-23 | Discharge: 2015-03-23 | Discharge status: Against Medical Advice | Payer: Medicare Other | Attending: Emergency Medicine | Admitting: Emergency Medicine

## 2015-03-23 DIAGNOSIS — J449 Chronic obstructive pulmonary disease, unspecified: Secondary | ICD-10-CM | POA: Diagnosis not present

## 2015-03-23 DIAGNOSIS — Z0471 Encounter for examination and observation following alleged adult physical abuse: Secondary | ICD-10-CM | POA: Insufficient documentation

## 2015-03-23 DIAGNOSIS — Z720 Tobacco use: Secondary | ICD-10-CM | POA: Insufficient documentation

## 2015-03-23 DIAGNOSIS — G8929 Other chronic pain: Secondary | ICD-10-CM | POA: Insufficient documentation

## 2015-03-23 NOTE — ED Notes (Signed)
Patient ambulated to bathroom with no assistance or difficulty. 

## 2015-03-23 NOTE — ED Notes (Signed)
Was notified by security that patient has left the ER and is walking up the sidewalk. Patient seen further up the street walking, carrying her bags with her.

## 2015-03-23 NOTE — ED Notes (Signed)
[employer] speaking with patient.
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
## 2015-04-02 ENCOUNTER — Emergency Department (HOSPITAL_COMMUNITY)
Admission: EM | Admit: 2015-04-02 | Discharge: 2015-04-02 | Disposition: A | Discharge status: Home / Self Care | Payer: Medicare Other | Attending: Emergency Medicine | Admitting: Emergency Medicine

## 2015-04-02 ENCOUNTER — Encounter (HOSPITAL_COMMUNITY): Payer: Self-pay | Admitting: Emergency Medicine

## 2015-04-02 ENCOUNTER — Emergency Department (HOSPITAL_COMMUNITY): Payer: Medicare Other

## 2015-04-02 DIAGNOSIS — Z8542 Personal history of malignant neoplasm of other parts of uterus: Secondary | ICD-10-CM | POA: Insufficient documentation

## 2015-04-02 DIAGNOSIS — F10120 Alcohol abuse with intoxication, uncomplicated: Secondary | ICD-10-CM | POA: Insufficient documentation

## 2015-04-02 DIAGNOSIS — Z720 Tobacco use: Secondary | ICD-10-CM | POA: Insufficient documentation

## 2015-04-02 DIAGNOSIS — F3110 Bipolar disorder, current episode manic without psychotic features, unspecified: Secondary | ICD-10-CM | POA: Diagnosis not present

## 2015-04-02 DIAGNOSIS — R42 Dizziness and giddiness: Secondary | ICD-10-CM | POA: Diagnosis present

## 2015-04-02 DIAGNOSIS — Z8639 Personal history of other endocrine, nutritional and metabolic disease: Secondary | ICD-10-CM | POA: Insufficient documentation

## 2015-04-02 DIAGNOSIS — G8929 Other chronic pain: Secondary | ICD-10-CM | POA: Insufficient documentation

## 2015-04-02 DIAGNOSIS — F10920 Alcohol use, unspecified with intoxication, uncomplicated: Secondary | ICD-10-CM

## 2015-04-02 DIAGNOSIS — J449 Chronic obstructive pulmonary disease, unspecified: Secondary | ICD-10-CM | POA: Diagnosis not present

## 2015-04-02 LAB — CBC WITH DIFFERENTIAL/PLATELET
Basophils Absolute: 0 10*3/uL (ref 0.0–0.1)
Basophils Relative: 1 % (ref 0–1)
Eosinophils Absolute: 0 10*3/uL (ref 0.0–0.7)
Eosinophils Relative: 1 % (ref 0–5)
HEMATOCRIT: 42.2 % (ref 36.0–46.0)
HEMOGLOBIN: 15 g/dL (ref 12.0–15.0)
LYMPHS ABS: 2.5 10*3/uL (ref 0.7–4.0)
LYMPHS PCT: 55 % — AB (ref 12–46)
MCH: 36.7 pg — AB (ref 26.0–34.0)
MCHC: 35.5 g/dL (ref 30.0–36.0)
MCV: 103.2 fL — AB (ref 78.0–100.0)
MONOS PCT: 9 % (ref 3–12)
Monocytes Absolute: 0.4 10*3/uL (ref 0.1–1.0)
NEUTROS ABS: 1.5 10*3/uL — AB (ref 1.7–7.7)
NEUTROS PCT: 34 % — AB (ref 43–77)
Platelets: 162 10*3/uL (ref 150–400)
RBC: 4.09 MIL/uL (ref 3.87–5.11)
RDW: 13.5 % (ref 11.5–15.5)
WBC: 4.4 10*3/uL (ref 4.0–10.5)

## 2015-04-02 LAB — URINALYSIS, ROUTINE W REFLEX MICROSCOPIC
BILIRUBIN URINE: NEGATIVE
Glucose, UA: NEGATIVE mg/dL
HGB URINE DIPSTICK: NEGATIVE
KETONES UR: NEGATIVE mg/dL
Leukocytes, UA: NEGATIVE
Nitrite: NEGATIVE
Protein, ur: NEGATIVE mg/dL
UROBILINOGEN UA: 0.2 mg/dL (ref 0.0–1.0)
pH: 5.5 (ref 5.0–8.0)

## 2015-04-02 LAB — COMPREHENSIVE METABOLIC PANEL
ALBUMIN: 3.6 g/dL (ref 3.5–5.0)
ALT: 23 U/L (ref 14–54)
ANION GAP: 13 (ref 5–15)
AST: 80 U/L — ABNORMAL HIGH (ref 15–41)
Alkaline Phosphatase: 74 U/L (ref 38–126)
BUN: <5 mg/dL — ABNORMAL LOW (ref 6–20)
CHLORIDE: 101 mmol/L (ref 101–111)
CO2: 21 mmol/L — AB (ref 22–32)
Calcium: 8.2 mg/dL — ABNORMAL LOW (ref 8.9–10.3)
Creatinine, Ser: 0.47 mg/dL (ref 0.44–1.00)
GFR calc non Af Amer: >60 mL/min (ref >60)
GLUCOSE: 79 mg/dL (ref 65–99)
Potassium: 3.6 mmol/L (ref 3.5–5.1)
SODIUM: 135 mmol/L (ref 135–145)
Total Bilirubin: 0.4 mg/dL (ref 0.3–1.2)
Total Protein: 7 g/dL (ref 6.5–8.1)

## 2015-04-02 LAB — ETHANOL: Alcohol, Ethyl (B): 273 mg/dL — ABNORMAL HIGH (ref <5)

## 2015-04-02 LAB — RAPID URINE DRUG SCREEN, HOSP PERFORMED
AMPHETAMINES: NOT DETECTED
BARBITURATES: NOT DETECTED
Benzodiazepines: NOT DETECTED
Cocaine: NOT DETECTED
OPIATES: NOT DETECTED
TETRAHYDROCANNABINOL: NOT DETECTED

## 2015-04-02 NOTE — ED Notes (Signed)
Pt alert & oriented x4, stable gait. Patient given discharge instructions, paperwork & prescription(s). Patient  instructed to stop at the registration desk to finish any additional paperwork. Patient verbalized understanding. Pt left department w/ no further questions. 

## 2015-04-02 NOTE — Discharge Instructions (Signed)
Alcohol Use Disorder Alcohol use disorder is a mental disorder. It is not a one-time incident of heavy drinking. Alcohol use disorder is the excessive and uncontrollable use of alcohol over time that leads to problems with functioning in one or more areas of daily living. People with this disorder risk harming themselves and others when they drink to excess. Alcohol use disorder also can cause other mental disorders, such as mood and anxiety disorders, and serious physical problems. People with alcohol use disorder often misuse other drugs.  Alcohol use disorder is common and widespread. Some people with this disorder drink alcohol to cope with or escape from negative life events. Others drink to relieve chronic pain or symptoms of mental illness. People with a family history of alcohol use disorder are at higher risk of losing control and using alcohol to excess.  SYMPTOMS  Signs and symptoms of alcohol use disorder may include the following:   Consumption ofalcohol inlarger amounts or over a longer period of time than intended.  Multiple unsuccessful attempts to cutdown or control alcohol use.   A great deal of time spent obtaining alcohol, using alcohol, or recovering from the effects of alcohol (hangover).  A strong desire or urge to use alcohol (cravings).   Continued use of alcohol despite problems at work, school, or home because of alcohol use.   Continued use of alcohol despite problems in relationships because of alcohol use.  Continued use of alcohol in situations when it is physically hazardous, such as driving a car.  Continued use of alcohol despite awareness of a physical or psychological problem that is likely related to alcohol use. Physical problems related to alcohol use can involve the brain, heart, liver, stomach, and intestines. Psychological problems related to alcohol use include intoxication, depression, anxiety, psychosis, delirium, and dementia.   The need for  increased amounts of alcohol to achieve the same desired effect, or a decreased effect from the consumption of the same amount of alcohol (tolerance).  Withdrawal symptoms upon reducing or stopping alcohol use, or alcohol use to reduce or avoid withdrawal symptoms. Withdrawal symptoms include:  Racing heart.  Hand tremor.  Difficulty sleeping.  Nausea.  Vomiting.  Hallucinations.  Restlessness.  Seizures. DIAGNOSIS Alcohol use disorder is diagnosed through an assessment by your health care provider. Your health care provider may start by asking three or four questions to screen for excessive or problematic alcohol use. To confirm a diagnosis of alcohol use disorder, at least two symptoms must be present within a 12-month period. The severity of alcohol use disorder depends on the number of symptoms:  Mild--two or three.  Moderate--four or five.  Severe--six or more. Your health care provider may perform a physical exam or use results from lab tests to see if you have physical problems resulting from alcohol use. Your health care provider may refer you to a mental health professional for evaluation. TREATMENT  Some people with alcohol use disorder are able to reduce their alcohol use to low-risk levels. Some people with alcohol use disorder need to quit drinking alcohol. When necessary, mental health professionals with specialized training in substance use treatment can help. Your health care provider can help you decide how severe your alcohol use disorder is and what type of treatment you need. The following forms of treatment are available:   Detoxification. Detoxification involves the use of prescription medicines to prevent alcohol withdrawal symptoms in the first week after quitting. This is important for people with a history of symptoms   of withdrawal and for heavy drinkers who are likely to have withdrawal symptoms. Alcohol withdrawal can be dangerous and, in severe cases, cause  death. Detoxification is usually provided in a hospital or in-patient substance use treatment facility.  Counseling or talk therapy. Talk therapy is provided by substance use treatment counselors. It addresses the reasons people use alcohol and ways to keep them from drinking again. The goals of talk therapy are to help people with alcohol use disorder find healthy activities and ways to cope with life stress, to identify and avoid triggers for alcohol use, and to handle cravings, which can cause relapse.  Medicines.Different medicines can help treat alcohol use disorder through the following actions:  Decrease alcohol cravings.  Decrease the positive reward response felt from alcohol use.  Produce an uncomfortable physical reaction when alcohol is used (aversion therapy).  Support groups. Support groups are run by people who have quit drinking. They provide emotional support, advice, and guidance. These forms of treatment are often combined. Some people with alcohol use disorder benefit from intensive combination treatment provided by specialized substance use treatment centers. Both inpatient and outpatient treatment programs are available. Document Released: 09/06/2004 Document Revised: 12/14/2013 Document Reviewed: 11/06/2012 Exit[employer] Patient Information 2015 [employer]. This information is not intended to replace advice given to you by your health care provider. Make sure you discuss any questions you have with your health care provider.  

## 2015-04-02 NOTE — ED Notes (Signed)
Patient arrives via EMS with multiple complaints. Dizziness, headache, shortness of breath. States she has had 3-4 40 oz beers today. States today she wants help and will stay

## 2015-04-02 NOTE — ED Notes (Signed)
1 t-shirt dress 1 pair of crocks 1 black duffel bag (small) filled with 2 unopened bud light cans, 2 dollar bills, cigarettes, assorted grocery (non-perishable) items, and 1 loaf of bread,  Belongings locked in ED locker
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
## 2015-04-02 NOTE — ED Notes (Signed)
Pt requesting to leave.  Dr Alvino Chapel notified.  Pt given personal belongings.  VSS.

## 2015-06-01 ENCOUNTER — Emergency Department (HOSPITAL_COMMUNITY)
Admission: EM | Admit: 2015-06-01 | Discharge: 2015-06-01 | Discharge status: Against Medical Advice | Payer: Medicare Other | Attending: Emergency Medicine | Admitting: Emergency Medicine

## 2015-06-01 ENCOUNTER — Encounter (HOSPITAL_COMMUNITY): Payer: Self-pay | Admitting: Emergency Medicine

## 2015-06-01 DIAGNOSIS — G8929 Other chronic pain: Secondary | ICD-10-CM | POA: Diagnosis not present

## 2015-06-01 DIAGNOSIS — F10120 Alcohol abuse with intoxication, uncomplicated: Secondary | ICD-10-CM | POA: Diagnosis not present

## 2015-06-01 DIAGNOSIS — R4182 Altered mental status, unspecified: Secondary | ICD-10-CM | POA: Diagnosis not present

## 2015-06-01 DIAGNOSIS — J449 Chronic obstructive pulmonary disease, unspecified: Secondary | ICD-10-CM | POA: Insufficient documentation

## 2015-06-01 DIAGNOSIS — Z720 Tobacco use: Secondary | ICD-10-CM | POA: Insufficient documentation

## 2015-06-01 LAB — RAPID URINE DRUG SCREEN, HOSP PERFORMED
AMPHETAMINES: NOT DETECTED
Barbiturates: NOT DETECTED
Benzodiazepines: NOT DETECTED
Cocaine: NOT DETECTED
Opiates: NOT DETECTED
TETRAHYDROCANNABINOL: NOT DETECTED

## 2015-06-01 LAB — CBC
HCT: 36.2 % (ref 36.0–46.0)
HEMOGLOBIN: 12.3 g/dL (ref 12.0–15.0)
MCH: 35.1 pg — AB (ref 26.0–34.0)
MCHC: 34 g/dL (ref 30.0–36.0)
MCV: 103.4 fL — AB (ref 78.0–100.0)
PLATELETS: 202 10*3/uL (ref 150–400)
RBC: 3.5 MIL/uL — AB (ref 3.87–5.11)
RDW: 13.4 % (ref 11.5–15.5)
WBC: 5.1 10*3/uL (ref 4.0–10.5)

## 2015-06-01 LAB — URINALYSIS, ROUTINE W REFLEX MICROSCOPIC
BILIRUBIN URINE: NEGATIVE
Glucose, UA: NEGATIVE mg/dL
HGB URINE DIPSTICK: NEGATIVE
Ketones, ur: NEGATIVE mg/dL
Nitrite: NEGATIVE
PROTEIN: NEGATIVE mg/dL
Specific Gravity, Urine: <1.005 — ABNORMAL LOW (ref 1.005–1.030)
UROBILINOGEN UA: 0.2 mg/dL (ref 0.0–1.0)
pH: 6 (ref 5.0–8.0)

## 2015-06-01 LAB — URINE MICROSCOPIC-ADD ON

## 2015-06-01 LAB — ACETAMINOPHEN LEVEL: Acetaminophen (Tylenol), Serum: <10 ug/mL — ABNORMAL LOW (ref 10–30)

## 2015-06-01 LAB — COMPREHENSIVE METABOLIC PANEL
ALT: 11 U/L — ABNORMAL LOW (ref 14–54)
ANION GAP: 7 (ref 5–15)
AST: 21 U/L (ref 15–41)
Albumin: 3.3 g/dL — ABNORMAL LOW (ref 3.5–5.0)
Alkaline Phosphatase: 54 U/L (ref 38–126)
BUN: 4 mg/dL — ABNORMAL LOW (ref 6–20)
CHLORIDE: 108 mmol/L (ref 101–111)
CO2: 23 mmol/L (ref 22–32)
Calcium: 7.6 mg/dL — ABNORMAL LOW (ref 8.9–10.3)
Creatinine, Ser: 0.51 mg/dL (ref 0.44–1.00)
Glucose, Bld: 91 mg/dL (ref 65–99)
POTASSIUM: 3.5 mmol/L (ref 3.5–5.1)
Sodium: 138 mmol/L (ref 135–145)
TOTAL PROTEIN: 6 g/dL — AB (ref 6.5–8.1)
Total Bilirubin: 0.5 mg/dL (ref 0.3–1.2)

## 2015-06-01 LAB — SALICYLATE LEVEL: Salicylate Lvl: <4 mg/dL (ref 2.8–30.0)

## 2015-06-01 LAB — ETHANOL: ALCOHOL ETHYL (B): 251 mg/dL — AB (ref <5)

## 2015-06-01 NOTE — ED Notes (Addendum)
Patient walked out of department with steady gait. No distress. States she is leaving. Would not stay.

## 2015-06-01 NOTE — ED Notes (Signed)
Patient found sitting on bench outside near [facility]. Altered mental status. Patient is alert, slurred speech. +ETOH.

## 2015-06-01 NOTE — ED Notes (Signed)
Patient pulled IV out. Bleeding controlled. Ambulatory to restroom. Urine specimen obtained.

## 2015-06-15 ENCOUNTER — Encounter (HOSPITAL_COMMUNITY): Payer: Self-pay | Admitting: *Deleted

## 2015-06-15 ENCOUNTER — Emergency Department (HOSPITAL_COMMUNITY): Payer: Medicare Other

## 2015-06-15 ENCOUNTER — Emergency Department (HOSPITAL_COMMUNITY)
Admission: EM | Admit: 2015-06-15 | Discharge: 2015-06-15 | Discharge status: Against Medical Advice | Payer: Medicare Other | Attending: Emergency Medicine | Admitting: Emergency Medicine

## 2015-06-15 ENCOUNTER — Emergency Department (HOSPITAL_COMMUNITY)
Admission: EM | Admit: 2015-06-15 | Discharge: 2015-06-15 | Disposition: A | Discharge status: Home / Self Care | Payer: Medicare Other | Source: Home / Self Care

## 2015-06-15 DIAGNOSIS — Z8541 Personal history of malignant neoplasm of cervix uteri: Secondary | ICD-10-CM | POA: Diagnosis not present

## 2015-06-15 DIAGNOSIS — J449 Chronic obstructive pulmonary disease, unspecified: Secondary | ICD-10-CM | POA: Insufficient documentation

## 2015-06-15 DIAGNOSIS — F10120 Alcohol abuse with intoxication, uncomplicated: Secondary | ICD-10-CM | POA: Insufficient documentation

## 2015-06-15 DIAGNOSIS — F10920 Alcohol use, unspecified with intoxication, uncomplicated: Secondary | ICD-10-CM

## 2015-06-15 DIAGNOSIS — Z720 Tobacco use: Secondary | ICD-10-CM | POA: Insufficient documentation

## 2015-06-15 DIAGNOSIS — Z8639 Personal history of other endocrine, nutritional and metabolic disease: Secondary | ICD-10-CM | POA: Diagnosis not present

## 2015-06-15 DIAGNOSIS — G8929 Other chronic pain: Secondary | ICD-10-CM | POA: Diagnosis not present

## 2015-06-15 DIAGNOSIS — I959 Hypotension, unspecified: Secondary | ICD-10-CM | POA: Diagnosis not present

## 2015-06-15 DIAGNOSIS — R55 Syncope and collapse: Secondary | ICD-10-CM | POA: Diagnosis present

## 2015-06-15 LAB — COMPREHENSIVE METABOLIC PANEL
ALT: 13 U/L — ABNORMAL LOW (ref 14–54)
ANION GAP: 11 (ref 5–15)
AST: 33 U/L (ref 15–41)
Albumin: 3.8 g/dL (ref 3.5–5.0)
Alkaline Phosphatase: 64 U/L (ref 38–126)
BUN: 8 mg/dL (ref 6–20)
CHLORIDE: 108 mmol/L (ref 101–111)
CO2: 18 mmol/L — ABNORMAL LOW (ref 22–32)
Calcium: 7.9 mg/dL — ABNORMAL LOW (ref 8.9–10.3)
Creatinine, Ser: 0.7 mg/dL (ref 0.44–1.00)
Glucose, Bld: 109 mg/dL — ABNORMAL HIGH (ref 65–99)
POTASSIUM: 3.2 mmol/L — AB (ref 3.5–5.1)
Sodium: 137 mmol/L (ref 135–145)
Total Bilirubin: 0.3 mg/dL (ref 0.3–1.2)
Total Protein: 6.9 g/dL (ref 6.5–8.1)

## 2015-06-15 LAB — LACTIC ACID, PLASMA: LACTIC ACID, VENOUS: 2.9 mmol/L — AB (ref 0.5–2.0)

## 2015-06-15 LAB — CBC WITH DIFFERENTIAL/PLATELET
BASOS ABS: 0 10*3/uL (ref 0.0–0.1)
BASOS PCT: 0 %
EOS PCT: 1 %
Eosinophils Absolute: 0 10*3/uL (ref 0.0–0.7)
HCT: 44.3 % (ref 36.0–46.0)
Hemoglobin: 14.9 g/dL (ref 12.0–15.0)
Lymphocytes Relative: 40 %
Lymphs Abs: 2.2 10*3/uL (ref 0.7–4.0)
MCH: 35.5 pg — ABNORMAL HIGH (ref 26.0–34.0)
MCHC: 33.6 g/dL (ref 30.0–36.0)
MCV: 105.5 fL — AB (ref 78.0–100.0)
MONO ABS: 0.3 10*3/uL (ref 0.1–1.0)
MONOS PCT: 6 %
Neutro Abs: 3 10*3/uL (ref 1.7–7.7)
Neutrophils Relative %: 53 %
PLATELETS: 227 10*3/uL (ref 150–400)
RBC: 4.2 MIL/uL (ref 3.87–5.11)
RDW: 13.8 % (ref 11.5–15.5)
WBC: 5.5 10*3/uL (ref 4.0–10.5)

## 2015-06-15 LAB — ETHANOL: ALCOHOL ETHYL (B): 254 mg/dL — AB (ref <5)

## 2015-06-15 LAB — TROPONIN I

## 2015-06-15 LAB — ACETAMINOPHEN LEVEL

## 2015-06-15 LAB — SALICYLATE LEVEL: Salicylate Lvl: <4 mg/dL (ref 2.8–30.0)

## 2015-06-15 MED ORDER — SODIUM CHLORIDE 0.9 % IV SOLN: INTRAVENOUS | Status: DC
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
## 2015-06-15 NOTE — ED Notes (Signed)
Pt had previously walked out per ED staff. Took Critical call from lab for Lactic Acid of 2.9. EDP aware.

## 2015-06-15 NOTE — ED Notes (Signed)
MD at bedside. 

## 2015-06-15 NOTE — ED Notes (Signed)
Pt leaving AMA. MD notified. Pt ambulating in room with steady gait.

## 2015-07-29 ENCOUNTER — Encounter (HOSPITAL_COMMUNITY): Payer: Self-pay | Admitting: *Deleted

## 2015-07-29 ENCOUNTER — Emergency Department (HOSPITAL_COMMUNITY)
Admission: EM | Admit: 2015-07-29 | Discharge: 2015-07-29 | Disposition: A | Discharge status: Home / Self Care | Payer: Medicare Other | Attending: Emergency Medicine | Admitting: Emergency Medicine

## 2015-07-29 ENCOUNTER — Emergency Department (HOSPITAL_COMMUNITY): Payer: Medicare Other

## 2015-07-29 DIAGNOSIS — S0086XA Insect bite (nonvenomous) of other part of head, initial encounter: Secondary | ICD-10-CM | POA: Diagnosis not present

## 2015-07-29 DIAGNOSIS — Z8542 Personal history of malignant neoplasm of other parts of uterus: Secondary | ICD-10-CM | POA: Insufficient documentation

## 2015-07-29 DIAGNOSIS — W57XXXA Bitten or stung by nonvenomous insect and other nonvenomous arthropods, initial encounter: Secondary | ICD-10-CM | POA: Insufficient documentation

## 2015-07-29 DIAGNOSIS — J029 Acute pharyngitis, unspecified: Secondary | ICD-10-CM | POA: Diagnosis not present

## 2015-07-29 DIAGNOSIS — S40861A Insect bite (nonvenomous) of right upper arm, initial encounter: Secondary | ICD-10-CM | POA: Diagnosis not present

## 2015-07-29 DIAGNOSIS — S20469A Insect bite (nonvenomous) of unspecified back wall of thorax, initial encounter: Secondary | ICD-10-CM | POA: Diagnosis not present

## 2015-07-29 DIAGNOSIS — S1096XA Insect bite of unspecified part of neck, initial encounter: Secondary | ICD-10-CM | POA: Insufficient documentation

## 2015-07-29 DIAGNOSIS — Y9389 Activity, other specified: Secondary | ICD-10-CM | POA: Insufficient documentation

## 2015-07-29 DIAGNOSIS — F17210 Nicotine dependence, cigarettes, uncomplicated: Secondary | ICD-10-CM | POA: Diagnosis not present

## 2015-07-29 DIAGNOSIS — Z8659 Personal history of other mental and behavioral disorders: Secondary | ICD-10-CM | POA: Diagnosis not present

## 2015-07-29 DIAGNOSIS — E0590 Thyrotoxicosis, unspecified without thyrotoxic crisis or storm: Secondary | ICD-10-CM | POA: Diagnosis not present

## 2015-07-29 DIAGNOSIS — Y9289 Other specified places as the place of occurrence of the external cause: Secondary | ICD-10-CM | POA: Diagnosis not present

## 2015-07-29 DIAGNOSIS — R079 Chest pain, unspecified: Secondary | ICD-10-CM | POA: Diagnosis present

## 2015-07-29 DIAGNOSIS — G8929 Other chronic pain: Secondary | ICD-10-CM | POA: Insufficient documentation

## 2015-07-29 DIAGNOSIS — F419 Anxiety disorder, unspecified: Secondary | ICD-10-CM | POA: Insufficient documentation

## 2015-07-29 DIAGNOSIS — Z8639 Personal history of other endocrine, nutritional and metabolic disease: Secondary | ICD-10-CM | POA: Insufficient documentation

## 2015-07-29 DIAGNOSIS — R0789 Other chest pain: Secondary | ICD-10-CM | POA: Diagnosis not present

## 2015-07-29 DIAGNOSIS — R21 Rash and other nonspecific skin eruption: Secondary | ICD-10-CM | POA: Diagnosis present

## 2015-07-29 DIAGNOSIS — Y998 Other external cause status: Secondary | ICD-10-CM | POA: Diagnosis not present

## 2015-07-29 DIAGNOSIS — J449 Chronic obstructive pulmonary disease, unspecified: Secondary | ICD-10-CM | POA: Insufficient documentation

## 2015-07-29 HISTORY — DX: Low back pain: M54.5

## 2015-07-29 HISTORY — DX: Low back pain, unspecified: M54.50

## 2015-07-29 LAB — I-STAT CHEM 8, ED
CREATININE: 0.8 mg/dL (ref 0.44–1.00)
Calcium, Ion: 1.04 mmol/L — ABNORMAL LOW (ref 1.12–1.23)
Chloride: 103 mmol/L (ref 101–111)
Glucose, Bld: 100 mg/dL — ABNORMAL HIGH (ref 65–99)
HEMATOCRIT: 49 % — AB (ref 36.0–46.0)
HEMOGLOBIN: 16.7 g/dL — AB (ref 12.0–15.0)
POTASSIUM: 3.7 mmol/L (ref 3.5–5.1)
SODIUM: 139 mmol/L (ref 135–145)
TCO2: 21 mmol/L (ref 0–100)

## 2015-07-29 LAB — RAPID STREP SCREEN (MED CTR MEBANE ONLY): Streptococcus, Group A Screen (Direct): NEGATIVE

## 2015-07-29 LAB — I-STAT TROPONIN, ED: Troponin i, poc: 0 ng/mL (ref 0.00–0.08)

## 2015-07-29 MED ORDER — DIPHENHYDRAMINE HCL 50 MG/ML IJ SOLN
25.0000 mg | Freq: Once | INTRAMUSCULAR | Status: AC
  Administered 2015-07-29: 25 mg via INTRAMUSCULAR
  Filled 2015-07-29: qty 1

## 2015-07-29 MED ORDER — PERMETHRIN 5 % EX CREA: TOPICAL_CREAM | CUTANEOUS | Status: DC

## 2015-07-29 MED ORDER — TRIAMCINOLONE ACETONIDE 0.1 % EX CREA: 1.0000 "application " | TOPICAL_CREAM | Freq: Two times a day (BID) | CUTANEOUS | Status: DC

## 2015-07-29 MED ORDER — DIPHENHYDRAMINE HCL 25 MG PO TABS: 25.0000 mg | ORAL_TABLET | Freq: Four times a day (QID) | ORAL | Status: DC

## 2015-07-29 NOTE — ED Provider Notes (Signed)
CSN: [id number]     Arrival date & time 07/29/15  1912 History   First MD Initiated Contact with Patient 07/29/15 1929     Chief Complaint  Patient presents with  . Rash     (Consider location/radiation/quality/duration/timing/severity/associated sxs/prior Treatment) HPI Comments: Patient is a 49 year old female who presents to the emergency department with complaint of a rash.  The patient states that she has been staying in a local motel over the last several days. She now notices a rash on her neck, face, arms, and a few on her back. She states that her husband has been in the same bed and he does not have any rash. She thinks that she may have eaten some fish that she does not usually eat, otherwise been no changes in her diet. His been no recent changes in her medications. She complains of itching that is worst at night.  Patient is a 49 y.o. female presenting with rash. The history is provided by the patient.  Rash   Past Medical History  Diagnosis Date  . Mental disorder   . Bipolar affective disorder, manic ([place]) 1993    Dx'd at [facility]  . Personality disorders 1993  . Hyperthyroidism 1993  . Headache(784.0) 09/25/2011  . Chronic pain   . COPD (chronic obstructive pulmonary disease) ([place])   . Alcoholism ([place])   . Cancer [facility]) 1989    uterine   Past Surgical History  Procedure Laterality Date  . Back surgery    . Cauterization of uterine cancer  2008  . Breast surgery  2003    to check for possible cancer  . Tubal ligaation     Family History  Problem Relation Age of Onset  . Cirrhosis Father    Social History  Substance Use Topics  . Smoking status: Current Every Day Smoker -- 3.00 packs/day for 28 years    Types: Cigarettes  . Smokeless tobacco: None  . Alcohol Use: Yes     Comment: daily 18 pack   OB History    No data available     Review of Systems  Skin: Positive for rash.  Psychiatric/Behavioral: The patient is nervous/anxious.   All  other systems reviewed and are negative.     Allergies  Advil  Home Medications   Prior to Admission medications   Medication Sig Start Date End Date Taking? Authorizing Provider  acetaminophen (TYLENOL) 500 MG tablet Take 500 mg by mouth every 6 (six) hours as needed for mild pain or moderate pain.   Yes Historical Provider, MD  diphenhydrAMINE (BENADRYL) 25 MG tablet Take 1 tablet (25 mg total) by mouth every 6 (six) hours. 07/29/15   Lily Kocher, PA-C  permethrin (ELIMITE) 5 % cream Apply from neck to feet, leave on for 8 hours, and wash off. 07/29/15   Lily Kocher, PA-C  triamcinolone cream (KENALOG) 0.1 % Apply 1 application topically 2 (two) times daily. 07/29/15   Lily Kocher, PA-C   Pulse 95  Temp(Src) 97.4 F (36.3 C) (Oral)  Resp 16  Wt 51.965 kg  SpO2 96% Physical Exam  Constitutional: She is oriented to person, place, and time. She appears well-developed and well-nourished.  Non-toxic appearance.  HENT:  Head: Normocephalic.  Right Ear: Tympanic membrane and external ear normal.  Left Ear: Tympanic membrane and external ear normal.  Eyes: EOM and lids are normal. Pupils are equal, round, and reactive to light.  Neck: Normal range of motion. Neck supple. Carotid bruit is not present.  Cardiovascular: Normal rate, regular rhythm, normal heart sounds, intact distal pulses and normal pulses.   Pulmonary/Chest: Breath sounds normal. No respiratory distress.  Abdominal: Soft. Bowel sounds are normal. There is no tenderness. There is no guarding.  Musculoskeletal: Normal range of motion.  Lymphadenopathy:       Head (right side): No submandibular adenopathy present.       Head (left side): No submandibular adenopathy present.    She has no cervical adenopathy.  Neurological: She is alert and oriented to person, place, and time. She has normal strength. No cranial nerve deficit or sensory deficit.  Skin: Skin is warm and dry. Rash noted.  There are non-fluid-filled  papules noted on the right side of the face, the right arm, the right elbow,  Psychiatric: She has a normal mood and affect. Her speech is normal.  Nursing note and vitals reviewed.   ED Course  Procedures (including critical care time) Labs Review Labs Reviewed - No data to display  Imaging Review No results found. I have personally reviewed and evaluated these images and lab results as part of my medical decision-making.   EKG Interpretation None      MDM  Vital signs are well within normal limits. The examination questions insect bites, in particular possible bedbugs. Patient is treated with Elimite, Benadryl, and triamcinolone.    Final diagnoses:  Insect bites    **I have reviewed nursing notes, vital signs, and all appropriate lab and imaging results for this patient.Lily Kocher, PA-C 07/29/15 2053  Francine Graven, DO 07/31/15 [phone]

## 2015-07-29 NOTE — Discharge Instructions (Signed)
°Emergency Department Resource Guide °1) Find a Doctor and Pay Out of Pocket °Although you won't have to find out who is covered by your insurance plan, it is a good idea to ask around and get recommendations. You will then need to call the office and see if the doctor you have chosen will accept you as a new patient and what types of options they offer for patients who are self-pay. Some doctors offer discounts or will set up payment plans for their patients who do not have insurance, but you will need to ask so you aren't surprised when you get to your appointment. ° °2) Contact Your Local Health Department °Not all health departments have doctors that can see patients for sick visits, but many do, so it is worth a call to see if yours does. If you don't know where your local health department is, you can check in your phone book. The CDC also has a tool to help you locate your state's health department, and many state websites also have listings of all of their local health departments. ° °3) Find a Walk-in Clinic °If your illness is not likely to be very severe or complicated, you may want to try a walk in clinic. These are popping up all over the country in pharmacies, drugstores, and shopping centers. They're usually staffed by nurse practitioners or physician assistants that have been trained to treat common illnesses and complaints. They're usually fairly quick and inexpensive. However, if you have serious medical issues or chronic medical problems, these are probably not your best option. ° °No Primary Care Doctor: °- Call Health Connect at  832-8000 - they can help you locate a primary care doctor that  accepts your insurance, provides certain services, etc. °- Physician Referral Service- 1-800-533-3463 ° °Chronic Pain Problems: °Organization         Address  Phone   Notes  °[facility]  (336) [phone] Patients need to be referred by their primary care doctor.  ° °Medication  Assistance: °Organization         Address  Phone   Notes  °[place] [facility] [address] °[place], [place] [address] (336) 641-8030 --Must be a resident of [place] °-- Must have NO insurance coverage whatsoever (no Medicaid/ Medicare, etc.) °-- The pt. MUST have a primary care doctor that directs their care regularly and follows them in the community °  °[facility]  (866) 331-1348   °[employer]  (888) 892-1162   ° °Agencies that provide inexpensive medical care: °Organization         Address  Phone   Notes  °[facility]  (336) 832-8035   °[facility]    (336) 832-7272   °[facility] [address] °[place], [place] [address] (336) 832-4777   °[facility] [address], °[place] (336) 271-4999   °[facility]    (336) 373-0678   °[facility]    (336) 272-1050   °[facility] ° [address], [place] Phone:  (336) 832-4444, Fax:  (336) 832-4440 Hours of Operation:  9 am - 6 pm, M-F.  Also accepts Medicaid/Medicare and self-pay.  °[facility] ° [address], [place] Phone: (336) 832-3150, Fax: (336) [phone]. Hours of Operation:  8:30 am - 5:30 pm, M-F.  Also accepts Medicaid and self-pay.  °[facility] [place] [address], [place] Phone: (336) 878-6027   °[facility] [address], [place], [place] (336)723-1848, Ext. 123 Mondays & Thursdays: 7-9 AM.  First 15 patients are seen on a first come, first serve basis. °  ° °Medicaid-accepting [place] Providers: ° °Organization         Address  Phone   Notes  °[facility] [address], [place] (336) 641-2100 Also accepts self-pay patients.  °[facility] [address], [place] ° (336) 856-9996   °[facility] [address], [place]  (336) 288-8857   °[facility] [address] [place] [address], [place] (336) 299-7000   °Veita Bland [address], [place]  ° (336) 373-1557 Only accepts [place] Access Medicaid patients after they have their name applied to their card.  ° °Self-Pay (no insurance) in [place]: ° °Organization         Address  Phone   Notes  °Sickle Cell Patients, [facility] [address], [place] (336) 832-1970   °[facility] [address], [place] (336) 832-4400   °[facility] ° [address], [place] (336) 992-4800   °[facility]/Dr. Osei-Bonsu ° [address] [place] [address], [place] or [address], [place] (336) 841-8500 Phone number for both [place] and [place] locations is the same.  °[facility] [address], [place] (336) 299-0000   °[facility] [address] [place] [address], [place] or [address] (336) 852-7530 °(336) 878-2260   °[facility] [address], [place] (336) 350-1642, phone; (336) 294-5005, fax Sees patients 1st and 3rd Saturday of every month.  Must not qualify for public or private insurance (i.e. Medicaid, Medicare, [place] Health Choice, Veterans' Benefits) • Household income should be no more than 200% of the poverty level •The clinic cannot treat you if you are pregnant or think you are pregnant • Sexually transmitted diseases are not treated at the clinic.  ° ° °Dental Care: °Organization         Address  Phone  Notes  °[place] [facility] [address],  (336) 641-6152 Accepts children up to age 21 who are enrolled in Medicaid or [place] Health Choice; pregnant women with a Medicaid card; and children who have applied for Medicaid or [place] Health Choice, but were declined, whose parents can pay a reduced fee at time of service.  °[place]  [facility] [place]  [address], [place] (336) 641-7733 Accepts children up to age 21 who are enrolled in Medicaid or [place] Health Choice; pregnant women with a Medicaid card; and children who have applied for Medicaid or  Health Choice, but were declined, whose parents can pay a reduced fee at time of service.  °[facility] ° [address],  (336) 641-4533 Patients are seen by appointment only. Walk-ins are not accepted. [facility] will see patients 18 years of age and older. °Monday - Tuesday (8am-5pm) °Most Wednesdays (8:30-5pm) °$30 per visit, cash only  °[facility] ° [address], [place] (336) 641-4533 Patients are seen by appointment only. Walk-ins are not accepted. [facility] will see patients 18 years of age and older. °One   Wednesday Evening (Monthly: Volunteer Based).  $30 per visit, cash only  °[facility]  (919) 537-3737 for adults; Children under age 4, call Graduate Pediatric Dentistry at ([phone]) [phone]. Children aged 4-14, please call (919) [phone] to request a pediatric application. ° Dental services are provided in all areas of dental care including fillings, crowns and bridges, complete and partial dentures, implants, gum treatment, root canals, and extractions. Preventive care is also provided. Treatment is provided to both adults and children. °Patients are selected via a lottery and there is often a waiting list. °  °[facility] [address], °[place] ° (336) 763-8833 [email or web address] °  °[facility] [address], [place], [place] (336)723-1848, Ext. 123 Second and Fourth Thursday of each month, opens at 6:30 AM; Clinic ends at 9 AM.  Patients are seen on a first-come first-served basis, and a limited number are seen during each clinic.  ° °[facility] ° [address], [place], [place] (336) 723-7904    Eligibility Requirements °You must have lived in [place], [place], or [place] counties for at least the last three months. °  You cannot be eligible for state or federal sponsored healthcare insurance, including Veterans Administration, Medicaid, or Medicare. °  You generally cannot be eligible for healthcare insurance through your employer.  °  How to apply: °Eligibility screenings are held every Tuesday and Wednesday afternoon from 1:00 pm until 4:00 pm. You do not need an appointment for the interview!  °[facility] [address], [place], [place] 336-631-2330   °[facility]  336-342-8273   °[facility]  336-703-3100   °[facility]  336-570-6415   ° °Behavioral Health Resources in the Community: °Intensive Outpatient Programs °Organization         Address  Phone  Notes  °[facility] [address], [place], [place] 336-878-6098   °[facility] [address], [place], [place] 336-832-9800   °[facility] [address], [place], [place] ° 336-882-2125   °[facility] [address],  °[place], [place] 1-800-853-5163 or 336-641-4981   °Substance Abuse Resources °Organization         Address  Phone  Notes  °[facility]  336-882-2125   °[facility]  336-784-9470   °[facility]  336-285-9073   °[facility]  336-845-3988   °Residential & Outpatient Substance Abuse Program  1-800-659-3381   °Psychological Services °Organization         Address  Phone  Notes  °[facility]  336- 832-9600   °[facility]  336- 378-7881   °[facility] [address], [place] 1-800-853-5163 or 336-641-4981   ° °Mobile Crisis Teams °Organization         Address  Phone  Notes  °[facility], Mobile Crisis Care Unit  1-877-626-1772   °Assertive °[facility] ° [address]  [place], [place] 336-834-9664   °Sharon DeEsch [address] °[place] 336-554-5454   ° °Self-Help/Support Groups °Organization         Address  Phone             Notes  °[facility] [place] - variety of support groups  336- 373-1402 Call for more information  °Narcotics Anonymous (NA), [facility] [address], °[place]  2 meetings at this location  ° °  Residential Treatment Programs Organization         Address  Phone  Notes  [facility] [address],    [place]  1-769 665 5208   [facility]  [address] [place] [id number], [place], [place]   [place], [place] 224-705-1790 Admissions: 8am-3pm M-F  [facility] [place] [address]    [place], [place] [id number]   [facility] [address] [place], [place], [place]   [facility] [address],  [place], [place]   [facility] - Intensive Outpatient [address], [place], [place]   [facility] ([place]  [place], [place] 1-647-314-7158 or 5342097667   [facility] [address], [place], [place] Accepts Medicaid  [facility] [address],  [place] 1-503-532-1431 Substance Abuse/Addiction Treatment   [facility] Organization         Address  Phone  Notes  [facility]  5200161610   Domenic Schwab, PhD [address] [place], [place]   5621675004 or (208) 660-7274   [facility] [place], [place] (770)004-6873   [facility] [address] [place], [place] 513-151-6084 Insurance/Medicaid/sponsorship through [facility] [address] [place]                                    [place], [place] 2150259374 [place] [address], [place] [phone]    Dr. Adele Schilder  507-658-0033   [facility] Dept. 1) [address], [place] 2) [place] 3)  [address], [place] [phone]  905-009-1054   [place] 3126456070 or 774-348-4134 (After Hours)      Take over the counter tylenol and ibuprofen, as directed on packaging, as needed for discomfort.  Gargle with warm water several times per day to help with discomfort.  May also use over the counter sore throat pain medicines such as chloraseptic or sucrets, as directed on packaging, as needed for discomfort. Apply moist heat or ice to the area(s) of discomfort, for 15 minutes at a time, several times per day for the next few days.  Do not fall asleep on a heating or ice pack.  Call your regular medical doctor on Monday to schedule a follow up appointment next week.  Return to the Emergency Department immediately if worsening.

## 2015-07-29 NOTE — Discharge Instructions (Signed)
Use triamcinolone cream and Benadryl for itching rash. Apply Elimite from the neck to the soles of your feet, leave on for 8 hours, and wash this medication off. Please vacuum your carpeted area as well as your bed. A mattress cover may also be helpful.

## 2015-07-29 NOTE — ED Notes (Signed)
Pt reports chest pain after receiving a benadryl injection for bug bites. Pt reports sore throat x 10 mins.

## 2015-07-29 NOTE — ED Notes (Signed)
Pt states she has been staying in a hotel. Pt states she noticed several red bumps on her body arms, face, and bottom.

## 2015-07-29 NOTE — ED Provider Notes (Signed)
CSN: [id number]     Arrival date & time 07/29/15  2124 History   First MD Initiated Contact with Patient 07/29/15 2142     Chief Complaint  Patient presents with  . Chest Pain  . Sore Throat     HPI  Pt was seen at 2140. Per pt, c/o unknown onset and persistence of constant chest wall "pain" and "sore throat" that began "a while ago." Pt was seen in the ED 1 hour ago for rash and received an injection of benadryl, rx Elimite and discharged. Pt states she was sitting out in the waiting room after discharge and was told she and her friend needed to leave. Pt states she "didn't feel like walking in the cold back to the hotel" so she checked back in to the ED. When asked specifically when her symptoms began she states she "really doesn't know" and "maybe after I got the benadryl?" Denies SOB/wheezing, no palpitations, no abd pain, no N/V/D, no fevers, no hives.    Past Medical History  Diagnosis Date  . Mental disorder   . Bipolar affective disorder, manic ([place]) 1993    Dx'd at [facility]  . Personality disorders 1993  . Hyperthyroidism 1993  . Headache(784.0) 09/25/2011  . Chronic pain   . COPD (chronic obstructive pulmonary disease) ([place])   . Alcoholism ([place])   . Cancer ([place]) 1989    uterine  . Low back pain    Past Surgical History  Procedure Laterality Date  . Back surgery    . Cauterization of uterine cancer  2008  . Breast surgery  2003    to check for possible cancer  . Tubal ligaation     Family History  Problem Relation Age of Onset  . Cirrhosis Father    Social History  Substance Use Topics  . Smoking status: Current Every Day Smoker -- 3.00 packs/day for 28 years    Types: Cigarettes  . Smokeless tobacco: None  . Alcohol Use: Yes     Comment: daily 18 pack    Review of Systems ROS: Statement: All systems negative except as marked or noted in the HPI; Constitutional: Negative for fever and chills. ; ; Eyes: Negative for eye pain, redness and discharge. ; ;  ENMT: Negative for ear pain, hoarseness, nasal congestion, sinus pressure and +sore throat. ; ; Cardiovascular: Negative for palpitations, diaphoresis, dyspnea and peripheral edema. ; ; Respiratory: Negative for cough, wheezing and stridor. ; ; Gastrointestinal: Negative for nausea, vomiting, diarrhea, abdominal pain, blood in stool, hematemesis, jaundice and rectal bleeding. . ; ; Genitourinary: Negative for dysuria, flank pain and hematuria. ; ; Musculoskeletal: +chest wall pain. Negative for back pain and neck pain. Negative for swelling and trauma.; ; Skin: Negative for pruritus, rash, abrasions, blisters, bruising and skin lesion.; ; Neuro: Negative for headache, lightheadedness and neck stiffness. Negative for weakness, altered level of consciousness , altered mental status, extremity weakness, paresthesias, involuntary movement, seizure and syncope.      Allergies  Advil  Home Medications   Prior to Admission medications   Medication Sig Start Date End Date Taking? Authorizing Provider  acetaminophen (TYLENOL) 500 MG tablet Take 500 mg by mouth every 6 (six) hours as needed for mild pain or moderate pain.   Yes Historical Provider, MD  diphenhydrAMINE (BENADRYL) 25 MG tablet Take 1 tablet (25 mg total) by mouth every 6 (six) hours. 07/29/15   Lily Kocher, PA-C  permethrin (ELIMITE) 5 % cream Apply from neck to feet,  leave on for 8 hours, and wash off. 07/29/15   Lily Kocher, PA-C  triamcinolone cream (KENALOG) 0.1 % Apply 1 application topically 2 (two) times daily. 07/29/15   Lily Kocher, PA-C   BP 142/95 mmHg  Pulse 94  Temp(Src) 97.6 F (36.4 C) (Oral)  Resp 16  Wt 114 lb 9 oz (51.965 kg)  SpO2 97% Physical Exam  2145: Physical examination:  Nursing notes reviewed; Vital signs and O2 SAT reviewed;  Constitutional: Well developed, Well nourished, Well hydrated, In no acute distress; Head:  Normocephalic, atraumatic; Eyes: EOMI, PERRL, No scleral icterus; ENMT: Mouth and  pharynx normal, Mucous membranes moist. Mouth and pharynx without lesions. No tonsillar exudates. No intra-oral edema. No submandibular or sublingual edema. No hoarse voice, no drooling, no stridor. No pain with manipulation of larynx. No trismus.; Neck: Supple, Full range of motion, No lymphadenopathy; Cardiovascular: Regular rate and rhythm, No murmur, rub, or gallop; Respiratory: Breath sounds clear & equal bilaterally, No rales, rhonchi, wheezes.  Speaking full sentences with ease, Normal respiratory effort/excursion; Chest: +bilat upper parasternal and anterior chest wall areas tender to palp. No rash, no soft tissue crepitus, no deformity. Movement normal; Abdomen: Soft, Nontender, Nondistended, Normal bowel sounds; Genitourinary: No CVA tenderness; Extremities: Pulses normal, No tenderness, No edema, No calf edema or asymmetry.; Neuro: AA&Ox3, Major CN grossly intact.  Speech clear. No gross focal motor or sensory deficits in extremities. Climbs on and off stretcher easily by herself. Gait steady.; Skin: Color normal, Warm, Dry.   ED Course  Procedures (including critical care time)  Labs Review Imaging Review  I have personally reviewed and evaluated these images and lab results as part of my medical decision-making.   EKG Interpretation   Date/Time:  Friday July 29 2015 21:37:07 EST Ventricular Rate:  87 PR Interval:  185 QRS Duration: 87 QT Interval:  398 QTC Calculation: 479 R Axis:   84 Text Interpretation:  Sinus rhythm Baseline wander When compared with ECG  of 06/15/2015 No significant change was found Confirmed by [facility]  MD,  Nunzio Cory [id number]) on 07/29/2015 9:58:17 PM      MDM  MDM Reviewed: previous chart, nursing note and vitals Reviewed previous: labs and ECG Interpretation: labs, ECG and x-ray   Results for orders placed or performed during the hospital encounter of 07/29/15  Rapid strep screen  Result Value Ref Range   Streptococcus, Group A Screen  (Direct) NEGATIVE NEGATIVE  I-stat Chem 8, ED  Result Value Ref Range   Sodium 139 135 - 145 mmol/L   Potassium 3.7 3.5 - 5.1 mmol/L   Chloride 103 101 - 111 mmol/L   BUN <3 (L) 6 - 20 mg/dL   Creatinine, Ser 0.80 0.44 - 1.00 mg/dL   Glucose, Bld 100 (H) 65 - 99 mg/dL   Calcium, Ion 1.04 (L) 1.12 - 1.23 mmol/L   TCO2 21 0 - 100 mmol/L   Hemoglobin 16.7 (H) 12.0 - 15.0 g/dL   HCT 49.0 (H) 36.0 - 46.0 %  I-stat troponin, ED  Result Value Ref Range   Troponin i, poc 0.00 0.00 - 0.08 ng/mL   Comment 3           Dg Chest 2 View 07/29/2015  CLINICAL DATA:  Chest pain after Benadryl injection for bug bites, lethargy, COPD, history smoking, alcoholism, uterine cancer EXAM: CHEST  2 VIEW COMPARISON:  04/02/2015 FINDINGS: Normal heart size, mediastinal contours, and pulmonary vascularity. Minimal central peribronchial thickening and hyperinflation consistent with history of COPD. No  pulmonary infiltrate, pleural effusion, or pneumothorax. Few old lower LEFT rib fractures. IMPRESSION: COPD changes. No acute abnormalities. Electronically Signed   By: Lavonia Dana M.D.   On: 07/29/2015 23:15     2145:  Pt checked back into ED because she did not want to walk in the cold, and hospital staff would not allow pt to "just sit in the waiting room."  Pt requesting pain meds on my arrival to exam room. Informed she will be treated for her pain, but not with narcotics. Pt stated "but tylenol wasn't strong enough." Pt informed again that she would not be receiving narcotics; pt verb understanding.   2320:   Workup reassuring. Tx symptomatically. Dx and testing d/w pt.  Questions answered.  Verb understanding, agreeable to d/c home with outpt f/u.   Francine Graven, DO 07/31/15 2351

## 2015-08-01 LAB — CULTURE, GROUP A STREP: STREP A CULTURE: NEGATIVE

## 2015-12-03 ENCOUNTER — Emergency Department (HOSPITAL_COMMUNITY)
Admission: EM | Admit: 2015-12-03 | Discharge: 2015-12-03 | Discharge status: Against Medical Advice | Payer: Medicare Other | Attending: Emergency Medicine | Admitting: Emergency Medicine

## 2015-12-03 ENCOUNTER — Encounter (HOSPITAL_COMMUNITY): Payer: Self-pay | Admitting: Emergency Medicine

## 2015-12-03 ENCOUNTER — Emergency Department (HOSPITAL_COMMUNITY): Payer: Medicare Other

## 2015-12-03 DIAGNOSIS — Y929 Unspecified place or not applicable: Secondary | ICD-10-CM | POA: Insufficient documentation

## 2015-12-03 DIAGNOSIS — T7411XA Adult physical abuse, confirmed, initial encounter: Secondary | ICD-10-CM | POA: Insufficient documentation

## 2015-12-03 DIAGNOSIS — F319 Bipolar disorder, unspecified: Secondary | ICD-10-CM | POA: Insufficient documentation

## 2015-12-03 DIAGNOSIS — J449 Chronic obstructive pulmonary disease, unspecified: Secondary | ICD-10-CM | POA: Diagnosis not present

## 2015-12-03 DIAGNOSIS — F17210 Nicotine dependence, cigarettes, uncomplicated: Secondary | ICD-10-CM | POA: Insufficient documentation

## 2015-12-03 DIAGNOSIS — Y999 Unspecified external cause status: Secondary | ICD-10-CM | POA: Diagnosis not present

## 2015-12-03 DIAGNOSIS — R600 Localized edema: Secondary | ICD-10-CM | POA: Diagnosis not present

## 2015-12-03 DIAGNOSIS — S02402A Zygomatic fracture, unspecified, initial encounter for closed fracture: Secondary | ICD-10-CM

## 2015-12-03 DIAGNOSIS — Z5900 Homelessness unspecified: Secondary | ICD-10-CM

## 2015-12-03 DIAGNOSIS — F10220 Alcohol dependence with intoxication, uncomplicated: Secondary | ICD-10-CM | POA: Insufficient documentation

## 2015-12-03 DIAGNOSIS — Y939 Activity, unspecified: Secondary | ICD-10-CM | POA: Diagnosis not present

## 2015-12-03 LAB — PREGNANCY, URINE: PREG TEST UR: NEGATIVE

## 2015-12-03 LAB — URINALYSIS, ROUTINE W REFLEX MICROSCOPIC
BILIRUBIN URINE: NEGATIVE
Glucose, UA: NEGATIVE mg/dL
Hgb urine dipstick: NEGATIVE
KETONES UR: NEGATIVE mg/dL
Leukocytes, UA: NEGATIVE
NITRITE: NEGATIVE
PROTEIN: NEGATIVE mg/dL
Specific Gravity, Urine: <1.005 — ABNORMAL LOW (ref 1.005–1.030)
pH: 5.5 (ref 5.0–8.0)

## 2015-12-03 LAB — RAPID URINE DRUG SCREEN, HOSP PERFORMED
Amphetamines: NOT DETECTED
Barbiturates: NOT DETECTED
Benzodiazepines: NOT DETECTED
Cocaine: NOT DETECTED
Opiates: NOT DETECTED
TETRAHYDROCANNABINOL: NOT DETECTED

## 2015-12-03 LAB — COMPREHENSIVE METABOLIC PANEL
ALBUMIN: 4 g/dL (ref 3.5–5.0)
ALT: 17 U/L (ref 14–54)
ANION GAP: 12 (ref 5–15)
AST: 34 U/L (ref 15–41)
Alkaline Phosphatase: 72 U/L (ref 38–126)
BUN: 6 mg/dL (ref 6–20)
CALCIUM: 8.6 mg/dL — AB (ref 8.9–10.3)
CO2: 21 mmol/L — AB (ref 22–32)
Chloride: 107 mmol/L (ref 101–111)
Creatinine, Ser: 0.62 mg/dL (ref 0.44–1.00)
GFR calc non Af Amer: >60 mL/min (ref >60)
GLUCOSE: 95 mg/dL (ref 65–99)
POTASSIUM: 3.9 mmol/L (ref 3.5–5.1)
SODIUM: 140 mmol/L (ref 135–145)
TOTAL PROTEIN: 8 g/dL (ref 6.5–8.1)
Total Bilirubin: 0.2 mg/dL — ABNORMAL LOW (ref 0.3–1.2)

## 2015-12-03 LAB — CBC WITH DIFFERENTIAL/PLATELET
BASOS ABS: 0 10*3/uL (ref 0.0–0.1)
BASOS PCT: 0 %
Eosinophils Absolute: 0.1 10*3/uL (ref 0.0–0.7)
Eosinophils Relative: 1 %
HEMATOCRIT: 43.3 % (ref 36.0–46.0)
HEMOGLOBIN: 15.2 g/dL — AB (ref 12.0–15.0)
LYMPHS PCT: 50 %
Lymphs Abs: 3.7 10*3/uL (ref 0.7–4.0)
MCH: 36 pg — ABNORMAL HIGH (ref 26.0–34.0)
MCHC: 35.1 g/dL (ref 30.0–36.0)
MCV: 102.6 fL — AB (ref 78.0–100.0)
MONO ABS: 0.4 10*3/uL (ref 0.1–1.0)
MONOS PCT: 5 %
NEUTROS ABS: 3.2 10*3/uL (ref 1.7–7.7)
NEUTROS PCT: 44 %
Platelets: 249 10*3/uL (ref 150–400)
RBC: 4.22 MIL/uL (ref 3.87–5.11)
RDW: 13.9 % (ref 11.5–15.5)
WBC: 7.4 10*3/uL (ref 4.0–10.5)

## 2015-12-03 LAB — ETHANOL: Alcohol, Ethyl (B): 328 mg/dL (ref <5)

## 2015-12-03 MED ORDER — LORAZEPAM 2 MG/ML IJ SOLN: 1.0000 mg | Freq: Once | INTRAMUSCULAR | Status: DC

## 2015-12-03 NOTE — ED Provider Notes (Signed)
Pt received at sign out. Pt walking around her ED exam room, steady gait, easy resps, NAD. When ED RN went to re-check pt, room was empty. Pt apparently got herself dressed and eloped from the ED. [employer] was called for pt safety.   Francine Graven, DO 12/03/15 1658
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
## 2015-12-03 NOTE — Discharge Instructions (Signed)
Alcohol Intoxication  Alcohol intoxication occurs when you drink enough alcohol that it affects your ability to function. It can be mild or very severe. Drinking a lot of alcohol in a short time is called binge drinking. This can be very harmful. Drinking alcohol can also be more dangerous if you are taking medicines or other drugs. Some of the effects caused by alcohol may include:  · Loss of coordination.  · Changes in mood and behavior.  · Unclear thinking.  · Trouble talking (slurred speech).  · Throwing up (vomiting).  · Confusion.  · Slowed breathing.  · Twitching and shaking (seizures).  · Loss of consciousness.  HOME CARE  · Do not drive after drinking alcohol.  · Drink enough water and fluids to keep your pee (urine) clear or pale yellow. Avoid caffeine.  · Only take medicine as told by your doctor.  GET HELP IF:  · You throw up (vomit) many times.  · You do not feel better after a few days.  · You frequently have alcohol intoxication. Your doctor can help decide if you should see a substance use treatment counselor.  GET HELP RIGHT AWAY IF:  · You become shaky when you stop drinking.  · You have twitching and shaking.  · You throw up blood. It may look bright red or like coffee grounds.  · You notice blood in your poop (bowel movements).  · You become lightheaded or pass out (faint).  MAKE SURE YOU:   · Understand these instructions.  · Will watch your condition.  · Will get help right away if you are not doing well or get worse.     This information is not intended to replace advice given to you by your health care provider. Make sure you discuss any questions you have with your health care provider.     Document Released: 01/16/2008 Document Revised: 04/01/2013 Document Reviewed: 01/02/2013  Elsevier Interactive Patient Education ©2016 [employer]

## 2015-12-03 NOTE — ED Notes (Signed)
Patient walked out [employer] called
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
## 2016-03-05 ENCOUNTER — Encounter (HOSPITAL_COMMUNITY): Payer: Self-pay | Admitting: Emergency Medicine

## 2016-03-05 ENCOUNTER — Emergency Department (HOSPITAL_COMMUNITY): Payer: Medicare Other

## 2016-03-05 ENCOUNTER — Emergency Department (HOSPITAL_COMMUNITY)
Admission: EM | Admit: 2016-03-05 | Discharge: 2016-03-05 | Discharge status: Against Medical Advice | Payer: Medicare Other | Attending: Emergency Medicine | Admitting: Emergency Medicine

## 2016-03-05 DIAGNOSIS — R51 Headache: Secondary | ICD-10-CM | POA: Insufficient documentation

## 2016-03-05 DIAGNOSIS — S6992XA Unspecified injury of left wrist, hand and finger(s), initial encounter: Secondary | ICD-10-CM | POA: Diagnosis present

## 2016-03-05 DIAGNOSIS — R109 Unspecified abdominal pain: Secondary | ICD-10-CM | POA: Diagnosis not present

## 2016-03-05 DIAGNOSIS — Y929 Unspecified place or not applicable: Secondary | ICD-10-CM | POA: Diagnosis not present

## 2016-03-05 DIAGNOSIS — Y9389 Activity, other specified: Secondary | ICD-10-CM | POA: Diagnosis not present

## 2016-03-05 DIAGNOSIS — F17200 Nicotine dependence, unspecified, uncomplicated: Secondary | ICD-10-CM | POA: Diagnosis not present

## 2016-03-05 DIAGNOSIS — Y999 Unspecified external cause status: Secondary | ICD-10-CM | POA: Insufficient documentation

## 2016-03-05 DIAGNOSIS — F10920 Alcohol use, unspecified with intoxication, uncomplicated: Secondary | ICD-10-CM

## 2016-03-05 DIAGNOSIS — J449 Chronic obstructive pulmonary disease, unspecified: Secondary | ICD-10-CM | POA: Diagnosis not present

## 2016-03-05 DIAGNOSIS — F10129 Alcohol abuse with intoxication, unspecified: Secondary | ICD-10-CM | POA: Diagnosis not present

## 2016-03-05 DIAGNOSIS — S92511A Displaced fracture of proximal phalanx of right lesser toe(s), initial encounter for closed fracture: Secondary | ICD-10-CM | POA: Insufficient documentation

## 2016-03-05 DIAGNOSIS — Z79899 Other long term (current) drug therapy: Secondary | ICD-10-CM | POA: Insufficient documentation

## 2016-03-05 DIAGNOSIS — S62609A Fracture of unspecified phalanx of unspecified finger, initial encounter for closed fracture: Secondary | ICD-10-CM

## 2016-03-05 LAB — RAPID URINE DRUG SCREEN, HOSP PERFORMED
Amphetamines: NOT DETECTED
BENZODIAZEPINES: NOT DETECTED
Barbiturates: NOT DETECTED
COCAINE: NOT DETECTED
OPIATES: NOT DETECTED
Tetrahydrocannabinol: NOT DETECTED

## 2016-03-05 LAB — CBC WITH DIFFERENTIAL/PLATELET
BASOS PCT: 1 %
Basophils Absolute: 0 10*3/uL (ref 0.0–0.1)
Eosinophils Absolute: 0.1 10*3/uL (ref 0.0–0.7)
Eosinophils Relative: 1 %
HEMATOCRIT: 42.1 % (ref 36.0–46.0)
Hemoglobin: 14.7 g/dL (ref 12.0–15.0)
Lymphocytes Relative: 52 %
Lymphs Abs: 3.9 10*3/uL (ref 0.7–4.0)
MCH: 35.6 pg — ABNORMAL HIGH (ref 26.0–34.0)
MCHC: 34.9 g/dL (ref 30.0–36.0)
MCV: 101.9 fL — AB (ref 78.0–100.0)
MONO ABS: 0.6 10*3/uL (ref 0.1–1.0)
MONOS PCT: 8 %
NEUTROS ABS: 2.8 10*3/uL (ref 1.7–7.7)
Neutrophils Relative %: 38 %
Platelets: 293 10*3/uL (ref 150–400)
RBC: 4.13 MIL/uL (ref 3.87–5.11)
RDW: 13.9 % (ref 11.5–15.5)
WBC: 7.5 10*3/uL (ref 4.0–10.5)

## 2016-03-05 LAB — COMPREHENSIVE METABOLIC PANEL
ALBUMIN: 4.5 g/dL (ref 3.5–5.0)
ALT: 14 U/L (ref 14–54)
ANION GAP: 8 (ref 5–15)
AST: 26 U/L (ref 15–41)
Alkaline Phosphatase: 73 U/L (ref 38–126)
BILIRUBIN TOTAL: 0.8 mg/dL (ref 0.3–1.2)
BUN: 5 mg/dL — ABNORMAL LOW (ref 6–20)
CO2: 22 mmol/L (ref 22–32)
Calcium: 8.8 mg/dL — ABNORMAL LOW (ref 8.9–10.3)
Chloride: 104 mmol/L (ref 101–111)
Creatinine, Ser: 0.62 mg/dL (ref 0.44–1.00)
Glucose, Bld: 99 mg/dL (ref 65–99)
POTASSIUM: 3.8 mmol/L (ref 3.5–5.1)
Sodium: 134 mmol/L — ABNORMAL LOW (ref 135–145)
TOTAL PROTEIN: 8.3 g/dL — AB (ref 6.5–8.1)

## 2016-03-05 LAB — ETHANOL: ALCOHOL ETHYL (B): 277 mg/dL — AB (ref <5)

## 2016-03-05 MED ORDER — POVIDONE-IODINE 10 % EX SOLN
CUTANEOUS | Status: AC
  Administered 2016-03-05: 20:00:00
  Filled 2016-03-05: qty 118

## 2016-03-05 MED ORDER — LIDOCAINE HCL (PF) 1 % IJ SOLN
30.0000 mL | Freq: Once | INTRAMUSCULAR | Status: DC
  Filled 2016-03-05: qty 30

## 2016-03-05 MED ORDER — IOPAMIDOL (ISOVUE-300) INJECTION 61%
100.0000 mL | Freq: Once | INTRAVENOUS | Status: AC | PRN
  Administered 2016-03-05: 100 mL via INTRAVENOUS

## 2016-03-05 MED ORDER — HALOPERIDOL LACTATE 5 MG/ML IJ SOLN: 5.0000 mg | Freq: Once | INTRAMUSCULAR | Status: DC

## 2016-03-05 MED ORDER — LIDOCAINE HCL (PF) 2 % IJ SOLN
INTRAMUSCULAR | Status: AC
  Administered 2016-03-05: 20:00:00
  Filled 2016-03-05: qty 10

## 2016-03-05 NOTE — ED Notes (Signed)
Pt breathing hard and walking around nurses' station, pt redirected back to her room but refusing to go in; pt walking up hallway to leave ED; security called and Dr. Wyvonnia Dusky redirecting pt back to her room; pt states she is having a panic attack; pt currently in bed with blanket

## 2016-03-05 NOTE — ED Notes (Signed)
Pt left AMA °
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
## 2016-03-05 NOTE — ED Notes (Signed)
Pt will not stay in room. Continues to walk around the department despite multiple attempts to guide her back to her room. Notified security as well as my charge nurse that pt is trying to leave department with IV in place.

## 2016-03-11 ENCOUNTER — Encounter (HOSPITAL_COMMUNITY): Payer: Self-pay

## 2016-03-11 ENCOUNTER — Emergency Department (HOSPITAL_COMMUNITY): Payer: Medicare Other

## 2016-03-11 ENCOUNTER — Emergency Department (HOSPITAL_COMMUNITY)
Admission: EM | Admit: 2016-03-11 | Discharge: 2016-03-11 | Disposition: A | Discharge status: Home / Self Care | Payer: Medicare Other | Attending: Emergency Medicine | Admitting: Emergency Medicine

## 2016-03-11 DIAGNOSIS — X501XXA Overexertion from prolonged static or awkward postures, initial encounter: Secondary | ICD-10-CM | POA: Diagnosis not present

## 2016-03-11 DIAGNOSIS — Y999 Unspecified external cause status: Secondary | ICD-10-CM | POA: Insufficient documentation

## 2016-03-11 DIAGNOSIS — F17210 Nicotine dependence, cigarettes, uncomplicated: Secondary | ICD-10-CM | POA: Diagnosis not present

## 2016-03-11 DIAGNOSIS — S92015A Nondisplaced fracture of body of left calcaneus, initial encounter for closed fracture: Secondary | ICD-10-CM | POA: Diagnosis not present

## 2016-03-11 DIAGNOSIS — Y929 Unspecified place or not applicable: Secondary | ICD-10-CM | POA: Diagnosis not present

## 2016-03-11 DIAGNOSIS — Z79899 Other long term (current) drug therapy: Secondary | ICD-10-CM | POA: Diagnosis not present

## 2016-03-11 DIAGNOSIS — M79642 Pain in left hand: Secondary | ICD-10-CM | POA: Diagnosis present

## 2016-03-11 DIAGNOSIS — Y939 Activity, unspecified: Secondary | ICD-10-CM | POA: Insufficient documentation

## 2016-03-11 DIAGNOSIS — S92001A Unspecified fracture of right calcaneus, initial encounter for closed fracture: Secondary | ICD-10-CM

## 2016-03-11 DIAGNOSIS — E0590 Thyrotoxicosis, unspecified without thyrotoxic crisis or storm: Secondary | ICD-10-CM | POA: Diagnosis not present

## 2016-03-11 DIAGNOSIS — J449 Chronic obstructive pulmonary disease, unspecified: Secondary | ICD-10-CM | POA: Diagnosis not present

## 2016-03-11 MED ORDER — TRAMADOL HCL 50 MG PO TABS
50.0000 mg | ORAL_TABLET | Freq: Once | ORAL | Status: AC
  Administered 2016-03-11: 50 mg via ORAL
  Filled 2016-03-11: qty 1

## 2016-03-11 MED ORDER — TRAMADOL HCL 50 MG PO TABS: 50.0000 mg | ORAL_TABLET | Freq: Four times a day (QID) | ORAL | 0 refills | Status: DC | PRN
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
## 2016-03-11 NOTE — ED Notes (Signed)
Pt given breakfast meal tray per request. Pt also given instruction of use of crutches. However, pt remains under influence of ETOH. Will verbalized instructions with caregiver when they arrive to transport patient home.

## 2016-03-11 NOTE — ED Notes (Signed)
Will keep patient in room due to ETOH intoxication until caregiver arrives.

## 2016-03-11 NOTE — ED Provider Notes (Signed)
[facility] DEPT Provider Note   CSN: [id number] Arrival date & time: 03/11/16  [id number]  First Provider Contact:  First MD Initiated Contact with Patient 03/11/16 0654        History   Chief Complaint Chief Complaint  Patient presents with  . Hand Pain    HPI Cynthia Church is a 50 y.o. female.  The pt is a alcoholic who has been drinking for "centuries", she states that she had a fall last night after drinking where she fell down the stairs landing awkwardly on her right ankle. She experienced acute onset of pain. This is why she comes to the ER this morning. She does acknowledge that she left AGAINST MEDICAL ADVICE without telling staff during her prior visit on July 24 during which time she was found to have a closed fracture of her fourth and fifth digits of the left hand. These were immobilized and reduced by the emergency department provider, the patient left without informing staff. She did not follow-up with the surgeon, she has not been using ice, elevation or immobilization of her hand. She complains now of right ankle pain which is persistent, worse with walking and associated with swelling.    Hand Pain     Past Medical History:  Diagnosis Date  . Alcoholism ([place])   . Bipolar affective disorder, manic ([place]) 1993   Dx'd at [facility]  . Cancer ([place]) 1989   uterine  . Chronic pain   . COPD (chronic obstructive pulmonary disease) ([place])   . Headache(784.0) 09/25/2011  . Hyperthyroidism 1993  . Low back pain   . Mental disorder   . Personality disorders 1993    Patient Active Problem List   Diagnosis Date Noted  . Alcohol dependence ([place]) 09/29/2011    Past Surgical History:  Procedure Laterality Date  . BACK SURGERY    . BREAST SURGERY  2003   to check for possible cancer  . cauterization of uterine cancer  2008  . tubal ligaation      OB History    No data available       Home Medications    Prior to Admission medications   Medication Sig  Start Date End Date Taking? Authorizing Provider  acetaminophen (TYLENOL) 500 MG tablet Take 500 mg by mouth every 6 (six) hours as needed for mild pain or moderate pain.    Historical Provider, MD  diphenhydrAMINE (BENADRYL) 25 MG tablet Take 1 tablet (25 mg total) by mouth every 6 (six) hours. 07/29/15   Lily Kocher, PA-C  permethrin (ELIMITE) 5 % cream Apply from neck to feet, leave on for 8 hours, and wash off. 07/29/15   Lily Kocher, PA-C  traMADol (ULTRAM) 50 MG tablet Take 1 tablet (50 mg total) by mouth every 6 (six) hours as needed. 03/11/16   Noemi Chapel, MD  triamcinolone cream (KENALOG) 0.1 % Apply 1 application topically 2 (two) times daily. 07/29/15   Lily Kocher, PA-C    Family History Family History  Problem Relation Age of Onset  . Cirrhosis Father     Social History Social History  Substance Use Topics  . Smoking status: Current Every Day Smoker    Packs/day: 3.00    Years: 28.00    Types: Cigarettes  . Smokeless tobacco: Never Used  . Alcohol use Yes     Comment: daily     Allergies   Advil [ibuprofen]   Review of Systems Review of Systems  Gastrointestinal: Negative for nausea and  vomiting.  Musculoskeletal: Positive for joint swelling ( ankle). Negative for back pain and neck pain.  Neurological: Negative for weakness and numbness.     Physical Exam Updated Vital Signs BP 116/96 (BP Location: Left Arm)   Pulse 90   Temp 98 F (36.7 C) (Oral)   Resp 20   Ht 5\' 5"  (1.651 m)   Wt 129 lb (58.5 kg)   SpO2 100%   BMI 21.47 kg/m   Physical Exam  Constitutional: She appears well-developed and well-nourished. No distress.  HENT:  Head: Normocephalic and atraumatic.  no facial tenderness, deformity, malocclusion or hemotympanum.  no battle's sign or racoon eyes.   Eyes: Conjunctivae are normal. No scleral icterus.  Cardiovascular: Normal rate, regular rhythm and intact distal pulses.   Pulmonary/Chest: Effort normal and breath sounds  normal.  Musculoskeletal: She exhibits tenderness. She exhibits no edema.  Decreased range of motion secondary to pain, tenderness over the left fourth and fifth digits with associated swelling, there is normal capillary refill distal to the injury, decreased range of motion at the joint secondary to pain  Neurological: She is alert.  Skin: Skin is warm and dry. No rash noted. She is not diaphoretic.  Nursing note and vitals reviewed.    ED Treatments / Results  Labs (all labs ordered are listed, but only abnormal results are displayed) Labs Reviewed - No data to display  EKG  EKG Interpretation None       Radiology Dg Ankle Complete Right  Result Date: 03/11/2016 CLINICAL DATA:  Fall, pain and swelling right lateral malleolus. Pain across top of foot. EXAM: RIGHT ANKLE - COMPLETE 3+ VIEW COMPARISON:  None. FINDINGS: Lucency noted through the calcaneus compatible with nondisplaced calcaneal fracture. No additional fracture noted. No subluxation or dislocation. Soft tissues are intact. IMPRESSION: Nondisplaced calcaneal fracture. Electronically Signed   By: Rolm Baptise M.D.   On: 03/11/2016 08:01  Dg Foot Complete Right  Result Date: 03/11/2016 CLINICAL DATA:  Fall, pain. EXAM: RIGHT FOOT COMPLETE - 3+ VIEW COMPARISON:  Ankle series performed today. FINDINGS: Nondisplaced fracture noted through the calcaneus. No additional fracture. No subluxation or dislocation. Joint spaces are maintained. Soft tissues are intact. IMPRESSION: Nondisplaced calcaneal fracture. Electronically Signed   By: Rolm Baptise M.D.   On: 03/11/2016 08:02   Procedures Procedures (including critical care time)  Medications Ordered in ED Medications - No data to display   Initial Impression / Assessment and Plan / ED Course  I have reviewed the triage vital signs and the nursing notes.  Pertinent labs & imaging results that were available during my care of the patient were reviewed by me and considered in  my medical decision making (see chart for details).  Clinical Course  Value Comment By Time  DG Foot Complete Right (Reviewed) Noemi Chapel, MD 07/30 0820   Pt informed of results Dr. Berenice Primas has agreed to see pt on Tuesday - requests posterior splint, RICE, pt informed and given short course of ULTRAm as she has a nsaid allergy and stronger narcotics make me concerned about increasing her fall risk.  She was counceled on her drinking. Noemi Chapel, MD 07/30 [id number]    The patient has evidence of repeat ankle injury, she has no bony tenderness over the malleoli but she is unable to bear weight and has obvious objective swelling. She does have tenderness distal to the lateral malleoli suggesting sprain however imaging will be obtained to rule out fracture. I've had a long discussion with  her regarding her alcohol use and need to reduce or go into treatment program, she does not want to do that at this time. I have also told her that she needs to follow up with the orthopedist regarding her finger fractures as these may need surgery. We have discussed cryotherapy, immobilization will be difficult given the amount of swelling.  Final Clinical Impressions(s) / ED Diagnoses   Final diagnoses:  Calcaneus fracture, right, closed, initial encounter  Calcaneal fracture, right, closed, initial encounter    New Prescriptions New Prescriptions   TRAMADOL (ULTRAM) 50 MG TABLET    Take 1 tablet (50 mg total) by mouth every 6 (six) hours as needed.     Noemi Chapel, MD 03/11/16 [id number]

## 2016-03-11 NOTE — Discharge Instructions (Signed)

## 2016-03-11 NOTE — ED Notes (Signed)
EDP updating patient. 

## 2016-03-11 NOTE — ED Notes (Signed)
Pt requesting to wait outside. Pt coherent. Pt able to ambulate with assistance and was able to get dressed. Pt wheeled out. Pt waiting for ride outside. Registration and security keeping an eye on patient until caregiver arrives.

## 2016-04-05 ENCOUNTER — Emergency Department (HOSPITAL_COMMUNITY)
Admission: EM | Admit: 2016-04-05 | Discharge: 2016-04-05 | Disposition: A | Discharge status: Home / Self Care | Payer: Medicare Other | Attending: Emergency Medicine | Admitting: Emergency Medicine

## 2016-04-05 ENCOUNTER — Encounter (HOSPITAL_COMMUNITY): Payer: Self-pay | Admitting: *Deleted

## 2016-04-05 DIAGNOSIS — J449 Chronic obstructive pulmonary disease, unspecified: Secondary | ICD-10-CM | POA: Diagnosis not present

## 2016-04-05 DIAGNOSIS — F10120 Alcohol abuse with intoxication, uncomplicated: Secondary | ICD-10-CM | POA: Insufficient documentation

## 2016-04-05 DIAGNOSIS — Z8542 Personal history of malignant neoplasm of other parts of uterus: Secondary | ICD-10-CM | POA: Insufficient documentation

## 2016-04-05 DIAGNOSIS — F17210 Nicotine dependence, cigarettes, uncomplicated: Secondary | ICD-10-CM | POA: Insufficient documentation

## 2016-04-05 DIAGNOSIS — F10920 Alcohol use, unspecified with intoxication, uncomplicated: Secondary | ICD-10-CM

## 2016-04-05 LAB — COMPREHENSIVE METABOLIC PANEL
ALT: 10 U/L — ABNORMAL LOW (ref 14–54)
AST: 22 U/L (ref 15–41)
Albumin: 4.3 g/dL (ref 3.5–5.0)
Alkaline Phosphatase: 67 U/L (ref 38–126)
Anion gap: 10 (ref 5–15)
BUN: 8 mg/dL (ref 6–20)
CO2: 23 mmol/L (ref 22–32)
Calcium: 9 mg/dL (ref 8.9–10.3)
Chloride: 102 mmol/L (ref 101–111)
Creatinine, Ser: 0.71 mg/dL (ref 0.44–1.00)
GFR calc Af Amer: >60 mL/min (ref >60)
GFR calc non Af Amer: >60 mL/min (ref >60)
Glucose, Bld: 93 mg/dL (ref 65–99)
Potassium: 4.2 mmol/L (ref 3.5–5.1)
Sodium: 135 mmol/L (ref 135–145)
Total Bilirubin: 0.6 mg/dL (ref 0.3–1.2)
Total Protein: 7.8 g/dL (ref 6.5–8.1)

## 2016-04-05 LAB — CBC
HCT: 41.8 % (ref 36.0–46.0)
Hemoglobin: 14.2 g/dL (ref 12.0–15.0)
MCH: 34.7 pg — ABNORMAL HIGH (ref 26.0–34.0)
MCHC: 34 g/dL (ref 30.0–36.0)
MCV: 102.2 fL — ABNORMAL HIGH (ref 78.0–100.0)
Platelets: 336 10*3/uL (ref 150–400)
RBC: 4.09 MIL/uL (ref 3.87–5.11)
RDW: 13.6 % (ref 11.5–15.5)
WBC: 8.4 10*3/uL (ref 4.0–10.5)

## 2016-04-05 LAB — ETHANOL: Alcohol, Ethyl (B): 273 mg/dL — ABNORMAL HIGH (ref <5)

## 2016-04-05 LAB — ACETAMINOPHEN LEVEL: Acetaminophen (Tylenol), Serum: 49 ug/mL — ABNORMAL HIGH (ref 10–30)

## 2016-04-05 LAB — SALICYLATE LEVEL: Salicylate Lvl: <4 mg/dL (ref 2.8–30.0)

## 2016-04-05 NOTE — ED Notes (Signed)
Medical necessity entered on wrong pt.

## 2016-04-05 NOTE — ED Triage Notes (Signed)
Pt comes in accompanied by [employer]. She made threats to [employer] that she wanted to harm herself and her boyfriend.  She states that she wants to harm her boyfriend too and " I am going to kill that man."  Pt has been drinking today. She states she hasn't been taking her bi-polar medication as well.

## 2016-04-05 NOTE — ED Notes (Signed)
Pt upset and wants to leave. CN aware. Dr Wilson Singer spoke with pt and will be discharged

## 2016-04-21 NOTE — ED Provider Notes (Signed)
[facility] DEPT Provider Note   CSN: [id number] Arrival date & time: 04/05/16  1710     History   Chief Complaint Chief Complaint  Patient presents with       HPI Cynthia Church is a 50 y.o. female.  HPI   50 year old female for intoxication. She has a history of alcohol abuse. States she was drinking and got into an argument with her boyfriend. Apparently she made threats on her own life as well as his. She denies actually wanting to harm him or herself. She says that she says these things when she gets angry but doesn't feel like she would actually act on them. She denies any other ingestion.  Past Medical History:  Diagnosis Date  . Alcoholism ([place])   . Bipolar affective disorder, manic ([place]) 1993   [facility]'d at [facility]  . Cancer ([place]) 1989   uterine  . Chronic pain   . COPD (chronic obstructive pulmonary disease) ([place])   . Headache(784.0) 09/25/2011  . Hyperthyroidism 1993  . Low back pain   . Mental disorder   . Personality disorders 1993    Patient Active Problem List   Diagnosis Date Noted  . Alcohol dependence ([place]) 09/29/2011    Past Surgical History:  Procedure Laterality Date  . BACK SURGERY    . BREAST SURGERY  2003   to check for possible cancer  . cauterization of uterine cancer  2008  . tubal ligaation      OB History    No data available       Home Medications    Prior to Admission medications   Medication Sig Start Date End Date Taking? Authorizing Provider  acetaminophen (TYLENOL) 500 MG tablet Take 500 mg by mouth every 6 (six) hours as needed for mild pain or moderate pain.    Historical Provider, MD  diphenhydrAMINE (BENADRYL) 25 MG tablet Take 1 tablet (25 mg total) by mouth every 6 (six) hours. 07/29/15   Lily Kocher, PA-C  traMADol (ULTRAM) 50 MG tablet Take 1 tablet (50 mg total) by mouth every 6 (six) hours as needed. 03/11/16   Noemi Chapel, MD  triamcinolone cream (KENALOG) 0.1 % Apply 1 application topically 2 (two)  times daily. 07/29/15   Lily Kocher, PA-C    Family History Family History  Problem Relation Age of Onset  . Cirrhosis Father     Social History Social History  Substance Use Topics  . Smoking status: Current Every Day Smoker    Packs/day: 3.00    Years: 28.00    Types: Cigarettes  . Smokeless tobacco: Never Used  . Alcohol use Yes     Comment: daily     Allergies   Advil [ibuprofen]   Review of Systems Review of Systems  All systems reviewed and negative, other than as noted in HPI.   Physical Exam Updated Vital Signs BP (!) 90/50   Pulse 65   Temp 97.8 F (36.6 C)   Resp 18   Ht 5\' 5"  (1.651 m)   Wt 129 lb (58.5 kg)   SpO2 97%   BMI 21.47 kg/m   Physical Exam  Constitutional: She appears well-developed and well-nourished. No distress.  HENT:  Head: Normocephalic and atraumatic.  Eyes: Conjunctivae are normal.  Neck: Neck supple.  Cardiovascular: Normal rate and regular rhythm.   No murmur heard. Pulmonary/Chest: Effort normal and breath sounds normal. No respiratory distress.  Abdominal: Soft. There is no tenderness.  Musculoskeletal: She exhibits no edema.  Neurological: She is alert.  Skin: Skin is warm and dry.  Psychiatric:  Clinically intoxicated. Healing at times. Is redirectable. Doesn't appear to be responding to internal stimuli.  Nursing note and vitals reviewed.    ED Treatments / Results  Labs (all labs ordered are listed, but only abnormal results are displayed) Labs Reviewed  COMPREHENSIVE METABOLIC PANEL - Abnormal; Notable for the following:       Result Value   ALT 10 (*)    All other components within normal limits  ETHANOL - Abnormal; Notable for the following:    Alcohol, Ethyl (B) 273 (*)    All other components within normal limits  ACETAMINOPHEN LEVEL - Abnormal; Notable for the following:    Acetaminophen (Tylenol), Serum 49 (*)    All other components within normal limits  CBC - Abnormal; Notable for the  following:    MCV 102.2 (*)    MCH 34.7 (*)    All other components within normal limits  SALICYLATE LEVEL    EKG  EKG Interpretation None       Radiology No results found.  Procedures Procedures (including critical care time)  Medications Ordered in ED Medications - No data to display   Initial Impression / Assessment and Plan / ED Course  I have reviewed the triage vital signs and the nursing notes.  Pertinent labs & imaging results that were available during my care of the patient were reviewed by me and considered in my medical decision making (see chart for details).  Clinical Course     Final Clinical Impressions(s) / ED Diagnoses   Final diagnoses:  Alcohol intoxication, uncomplicated ([place])   50 year old female with alcohol intoxication. Her thoughts are rambling. She does not appear to be acutely psychotic though. She wants to leave. Recently keep her here in the emergency room. She is declining further evaluation. She walked out under her own power and in no acute distress.  New Prescriptions Discharge Medication List as of 04/05/2016  7:15 PM       Virgel Manifold, MD 04/21/16 [phone]

## 2016-05-09 ENCOUNTER — Encounter (HOSPITAL_COMMUNITY): Payer: Self-pay | Admitting: *Deleted

## 2016-05-09 ENCOUNTER — Emergency Department (HOSPITAL_COMMUNITY)
Admission: EM | Admit: 2016-05-09 | Discharge: 2016-05-09 | Disposition: A | Discharge status: Home / Self Care | Payer: Medicare Other | Attending: Emergency Medicine | Admitting: Emergency Medicine

## 2016-05-09 DIAGNOSIS — F17210 Nicotine dependence, cigarettes, uncomplicated: Secondary | ICD-10-CM | POA: Diagnosis not present

## 2016-05-09 DIAGNOSIS — F10129 Alcohol abuse with intoxication, unspecified: Secondary | ICD-10-CM | POA: Diagnosis present

## 2016-05-09 DIAGNOSIS — J449 Chronic obstructive pulmonary disease, unspecified: Secondary | ICD-10-CM | POA: Diagnosis not present

## 2016-05-09 NOTE — ED Provider Notes (Addendum)
Pt eloped while walking back from the bathroom, security saw her go out the back ED entrance to the hall, however, after searching the whole first floor and outside, she was not found.   Pt was not seen by me.   Later security reports they drove past her boarding home which is just down the street from the hospital and they saw her on the porch of her boarding house.  Rolland Porter, MD, Barbette Or, MD 05/09/16 [place], MD 05/09/16 [phone]

## 2016-05-09 NOTE — ED Triage Notes (Signed)
Pt brought in for alcohol intoxication; pt denies falling; pt unable to answer questions; pt c/o headache

## 2016-05-09 NOTE — ED Notes (Signed)
Pt not in room, not anywhere in the ED. Per security patient was seen going to the restroom across from rooms 11-12, was then seen going back to exam room 3, and then going back down the 9-12 hallway. Registration states they have no seen the patient. The entire ED including the fast track area, and the main lobby, all unlocked rooms and outside parking lots searched by myself and Otila Kluver, Therapist, sports. Audree Camel with radiology helped search the radiology area. Despite all efforts still unable to locate patient.

## 2016-06-05 ENCOUNTER — Ambulatory Visit: Payer: Self-pay | Admitting: Family Medicine

## 2016-06-05 ENCOUNTER — Telehealth: Payer: Self-pay | Admitting: Family Medicine

## 2016-06-05 DIAGNOSIS — F319 Bipolar disorder, unspecified: Secondary | ICD-10-CM | POA: Insufficient documentation

## 2016-06-05 DIAGNOSIS — E039 Hypothyroidism, unspecified: Secondary | ICD-10-CM | POA: Insufficient documentation

## 2016-06-05 DIAGNOSIS — G894 Chronic pain syndrome: Secondary | ICD-10-CM | POA: Insufficient documentation

## 2016-06-05 DIAGNOSIS — F609 Personality disorder, unspecified: Secondary | ICD-10-CM | POA: Insufficient documentation

## 2016-06-05 DIAGNOSIS — Z720 Tobacco use: Secondary | ICD-10-CM | POA: Insufficient documentation

## 2016-06-05 DIAGNOSIS — J449 Chronic obstructive pulmonary disease, unspecified: Secondary | ICD-10-CM | POA: Insufficient documentation

## 2016-06-05 DIAGNOSIS — I700 Atherosclerosis of aorta: Secondary | ICD-10-CM | POA: Insufficient documentation

## 2016-06-05 NOTE — Telephone Encounter (Signed)
Per Dr. Meda Coffee do not reschedule because she no showed her apt.

## 2016-07-06 ENCOUNTER — Encounter (HOSPITAL_COMMUNITY): Payer: Self-pay | Admitting: Emergency Medicine

## 2016-07-06 ENCOUNTER — Emergency Department (HOSPITAL_COMMUNITY)
Admission: EM | Admit: 2016-07-06 | Discharge: 2016-07-07 | Payer: Medicare Other | Attending: Emergency Medicine | Admitting: Emergency Medicine

## 2016-07-06 ENCOUNTER — Emergency Department (HOSPITAL_COMMUNITY): Payer: Medicare Other

## 2016-07-06 DIAGNOSIS — R45851 Suicidal ideations: Secondary | ICD-10-CM | POA: Diagnosis present

## 2016-07-06 DIAGNOSIS — F17210 Nicotine dependence, cigarettes, uncomplicated: Secondary | ICD-10-CM | POA: Diagnosis not present

## 2016-07-06 DIAGNOSIS — E039 Hypothyroidism, unspecified: Secondary | ICD-10-CM | POA: Diagnosis not present

## 2016-07-06 DIAGNOSIS — R079 Chest pain, unspecified: Secondary | ICD-10-CM | POA: Insufficient documentation

## 2016-07-06 DIAGNOSIS — F10120 Alcohol abuse with intoxication, uncomplicated: Secondary | ICD-10-CM | POA: Insufficient documentation

## 2016-07-06 DIAGNOSIS — Z5181 Encounter for therapeutic drug level monitoring: Secondary | ICD-10-CM | POA: Diagnosis not present

## 2016-07-06 DIAGNOSIS — R0602 Shortness of breath: Secondary | ICD-10-CM | POA: Diagnosis not present

## 2016-07-06 DIAGNOSIS — F1290 Cannabis use, unspecified, uncomplicated: Secondary | ICD-10-CM | POA: Insufficient documentation

## 2016-07-06 DIAGNOSIS — R062 Wheezing: Secondary | ICD-10-CM | POA: Diagnosis not present

## 2016-07-06 DIAGNOSIS — Z8542 Personal history of malignant neoplasm of other parts of uterus: Secondary | ICD-10-CM | POA: Diagnosis not present

## 2016-07-06 DIAGNOSIS — J449 Chronic obstructive pulmonary disease, unspecified: Secondary | ICD-10-CM | POA: Diagnosis not present

## 2016-07-06 DIAGNOSIS — F10920 Alcohol use, unspecified with intoxication, uncomplicated: Secondary | ICD-10-CM

## 2016-07-06 LAB — CBC WITH DIFFERENTIAL/PLATELET
BASOS ABS: 0 10*3/uL (ref 0.0–0.1)
BASOS PCT: 0 %
EOS PCT: 1 %
Eosinophils Absolute: 0.1 10*3/uL (ref 0.0–0.7)
HEMATOCRIT: 41.1 % (ref 36.0–46.0)
Hemoglobin: 14.1 g/dL (ref 12.0–15.0)
LYMPHS PCT: 47 %
Lymphs Abs: 3.7 10*3/uL (ref 0.7–4.0)
MCH: 35.2 pg — ABNORMAL HIGH (ref 26.0–34.0)
MCHC: 34.3 g/dL (ref 30.0–36.0)
MCV: 102.5 fL — AB (ref 78.0–100.0)
MONO ABS: 0.6 10*3/uL (ref 0.1–1.0)
MONOS PCT: 8 %
NEUTROS ABS: 3.4 10*3/uL (ref 1.7–7.7)
Neutrophils Relative %: 44 %
PLATELETS: 205 10*3/uL (ref 150–400)
RBC: 4.01 MIL/uL (ref 3.87–5.11)
RDW: 13.3 % (ref 11.5–15.5)
WBC: 7.8 10*3/uL (ref 4.0–10.5)

## 2016-07-06 LAB — RAPID URINE DRUG SCREEN, HOSP PERFORMED
Amphetamines: NOT DETECTED
BARBITURATES: NOT DETECTED
BENZODIAZEPINES: NOT DETECTED
Cocaine: NOT DETECTED
Opiates: NOT DETECTED
Tetrahydrocannabinol: POSITIVE — AB

## 2016-07-06 LAB — COMPREHENSIVE METABOLIC PANEL
ALBUMIN: 4.1 g/dL (ref 3.5–5.0)
ALT: 18 U/L (ref 14–54)
AST: 30 U/L (ref 15–41)
Alkaline Phosphatase: 67 U/L (ref 38–126)
Anion gap: 8 (ref 5–15)
BILIRUBIN TOTAL: 0.4 mg/dL (ref 0.3–1.2)
BUN: 11 mg/dL (ref 6–20)
CO2: 22 mmol/L (ref 22–32)
CREATININE: 0.64 mg/dL (ref 0.44–1.00)
Calcium: 8.4 mg/dL — ABNORMAL LOW (ref 8.9–10.3)
Chloride: 102 mmol/L (ref 101–111)
GFR calc Af Amer: >60 mL/min (ref >60)
GLUCOSE: 93 mg/dL (ref 65–99)
Potassium: 3.8 mmol/L (ref 3.5–5.1)
Sodium: 132 mmol/L — ABNORMAL LOW (ref 135–145)
TOTAL PROTEIN: 7.7 g/dL (ref 6.5–8.1)

## 2016-07-06 LAB — TROPONIN I: Troponin I: <0.03 ng/mL (ref <0.03)

## 2016-07-06 LAB — ETHANOL: ALCOHOL ETHYL (B): 307 mg/dL — AB (ref <5)

## 2016-07-06 MED ORDER — ALBUTEROL SULFATE HFA 108 (90 BASE) MCG/ACT IN AERS
2.0000 | INHALATION_SPRAY | Freq: Once | RESPIRATORY_TRACT | Status: AC
  Administered 2016-07-06: 2 via RESPIRATORY_TRACT
  Filled 2016-07-06: qty 6.7

## 2016-07-06 MED ORDER — THIAMINE HCL 100 MG/ML IJ SOLN: 100.0000 mg | Freq: Every day | INTRAMUSCULAR | Status: DC

## 2016-07-06 MED ORDER — LORAZEPAM 1 MG PO TABS: 0.0000 mg | ORAL_TABLET | Freq: Four times a day (QID) | ORAL | Status: DC

## 2016-07-06 MED ORDER — VITAMIN B-1 100 MG PO TABS
100.0000 mg | ORAL_TABLET | Freq: Every day | ORAL | Status: DC
  Administered 2016-07-06: 100 mg via ORAL
  Filled 2016-07-06: qty 1

## 2016-07-06 MED ORDER — LORAZEPAM 1 MG PO TABS: 0.0000 mg | ORAL_TABLET | Freq: Two times a day (BID) | ORAL | Status: DC

## 2016-07-06 NOTE — ED Notes (Signed)
TTS in process 
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
## 2016-07-06 NOTE — ED Notes (Addendum)
CRITICAL VALUE ALERT  Critical value received:  etoh 307  Date of notification:  07/06/16  Time of notification:  2035  Critical value read back:Yes.     Nurse who received alert:  Kendell Bane RN  MD notified (1st page):  Regenia Skeeter  Time of first page: 2030  MD notified (2nd page):none  Time of second page:none  Responding MD:  Regenia Skeeter  Time MD responded: 2035

## 2016-07-06 NOTE — ED Notes (Signed)
Pt given meal tray and beverage.
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
## 2016-07-07 ENCOUNTER — Encounter (HOSPITAL_COMMUNITY): Payer: Self-pay | Admitting: *Deleted

## 2016-07-07 ENCOUNTER — Inpatient Hospital Stay (HOSPITAL_COMMUNITY)
Admission: AD | Admit: 2016-07-07 | Discharge: 2016-07-10 | DRG: 881 | Disposition: A | Discharge status: Home / Self Care | Payer: Medicare Other | Source: Intra-hospital | Attending: Psychiatry | Admitting: Psychiatry

## 2016-07-07 DIAGNOSIS — F419 Anxiety disorder, unspecified: Secondary | ICD-10-CM | POA: Diagnosis present

## 2016-07-07 DIAGNOSIS — F1290 Cannabis use, unspecified, uncomplicated: Secondary | ICD-10-CM | POA: Diagnosis present

## 2016-07-07 DIAGNOSIS — F209 Schizophrenia, unspecified: Secondary | ICD-10-CM | POA: Diagnosis present

## 2016-07-07 DIAGNOSIS — Z23 Encounter for immunization: Secondary | ICD-10-CM

## 2016-07-07 DIAGNOSIS — Z8489 Family history of other specified conditions: Secondary | ICD-10-CM | POA: Diagnosis not present

## 2016-07-07 DIAGNOSIS — F1029 Alcohol dependence with unspecified alcohol-induced disorder: Secondary | ICD-10-CM | POA: Diagnosis present

## 2016-07-07 DIAGNOSIS — F322 Major depressive disorder, single episode, severe without psychotic features: Secondary | ICD-10-CM | POA: Diagnosis not present

## 2016-07-07 DIAGNOSIS — F323 Major depressive disorder, single episode, severe with psychotic features: Secondary | ICD-10-CM | POA: Diagnosis not present

## 2016-07-07 DIAGNOSIS — F329 Major depressive disorder, single episode, unspecified: Principal | ICD-10-CM | POA: Diagnosis present

## 2016-07-07 DIAGNOSIS — Z79899 Other long term (current) drug therapy: Secondary | ICD-10-CM | POA: Diagnosis not present

## 2016-07-07 DIAGNOSIS — F17210 Nicotine dependence, cigarettes, uncomplicated: Secondary | ICD-10-CM | POA: Diagnosis present

## 2016-07-07 DIAGNOSIS — Z9889 Other specified postprocedural states: Secondary | ICD-10-CM | POA: Diagnosis not present

## 2016-07-07 DIAGNOSIS — R45851 Suicidal ideations: Secondary | ICD-10-CM | POA: Diagnosis present

## 2016-07-07 DIAGNOSIS — F10120 Alcohol abuse with intoxication, uncomplicated: Secondary | ICD-10-CM | POA: Diagnosis not present

## 2016-07-07 MED ORDER — CHLORDIAZEPOXIDE HCL 25 MG PO CAPS
25.0000 mg | ORAL_CAPSULE | Freq: Four times a day (QID) | ORAL | Status: AC | PRN
  Administered 2016-07-10: 25 mg via ORAL
  Filled 2016-07-07 (×2): qty 1

## 2016-07-07 MED ORDER — HYDROXYZINE HCL 25 MG PO TABS
25.0000 mg | ORAL_TABLET | Freq: Four times a day (QID) | ORAL | Status: AC | PRN
  Administered 2016-07-08 – 2016-07-10 (×4): 25 mg via ORAL
  Filled 2016-07-07 (×4): qty 1

## 2016-07-07 MED ORDER — THIAMINE HCL 100 MG/ML IJ SOLN: 100.0000 mg | Freq: Once | INTRAMUSCULAR | Status: DC

## 2016-07-07 MED ORDER — ACETAMINOPHEN 325 MG PO TABS
650.0000 mg | ORAL_TABLET | Freq: Four times a day (QID) | ORAL | Status: DC | PRN
  Administered 2016-07-07 – 2016-07-10 (×7): 650 mg via ORAL
  Filled 2016-07-07 (×7): qty 2

## 2016-07-07 MED ORDER — NICOTINE 21 MG/24HR TD PT24
21.0000 mg | MEDICATED_PATCH | Freq: Once | TRANSDERMAL | Status: DC
  Administered 2016-07-07: 21 mg via TRANSDERMAL
  Filled 2016-07-07: qty 1

## 2016-07-07 MED ORDER — ONDANSETRON 4 MG PO TBDP: 4.0000 mg | ORAL_TABLET | Freq: Four times a day (QID) | ORAL | Status: AC | PRN

## 2016-07-07 MED ORDER — TRAZODONE HCL 50 MG PO TABS
50.0000 mg | ORAL_TABLET | Freq: Every evening | ORAL | Status: DC | PRN
  Administered 2016-07-07: 50 mg via ORAL
  Filled 2016-07-07: qty 1

## 2016-07-07 MED ORDER — CHLORDIAZEPOXIDE HCL 25 MG PO CAPS
25.0000 mg | ORAL_CAPSULE | Freq: Three times a day (TID) | ORAL | Status: AC
  Administered 2016-07-08 (×3): 25 mg via ORAL
  Filled 2016-07-07 (×3): qty 1

## 2016-07-07 MED ORDER — CHLORDIAZEPOXIDE HCL 25 MG PO CAPS
25.0000 mg | ORAL_CAPSULE | Freq: Every day | ORAL | Status: AC
  Administered 2016-07-10: 25 mg via ORAL
  Filled 2016-07-07: qty 1

## 2016-07-07 MED ORDER — CHLORDIAZEPOXIDE HCL 25 MG PO CAPS
25.0000 mg | ORAL_CAPSULE | Freq: Four times a day (QID) | ORAL | Status: AC
  Administered 2016-07-07 (×3): 25 mg via ORAL
  Filled 2016-07-07 (×2): qty 1

## 2016-07-07 MED ORDER — LOPERAMIDE HCL 2 MG PO CAPS
2.0000 mg | ORAL_CAPSULE | ORAL | Status: AC | PRN
Start: 2016-07-07 — End: 2016-07-10

## 2016-07-07 MED ORDER — IBUPROFEN 600 MG PO TABS
600.0000 mg | ORAL_TABLET | Freq: Three times a day (TID) | ORAL | Status: DC | PRN
  Administered 2016-07-08 – 2016-07-10 (×3): 600 mg via ORAL
  Filled 2016-07-07 (×3): qty 1

## 2016-07-07 MED ORDER — VITAMIN B-1 100 MG PO TABS
100.0000 mg | ORAL_TABLET | Freq: Every day | ORAL | Status: DC
  Administered 2016-07-08 – 2016-07-10 (×3): 100 mg via ORAL
  Filled 2016-07-07 (×5): qty 1

## 2016-07-07 MED ORDER — NICOTINE 21 MG/24HR TD PT24
MEDICATED_PATCH | TRANSDERMAL | Status: AC
  Administered 2016-07-07: 21 mg
  Filled 2016-07-07: qty 1

## 2016-07-07 MED ORDER — ADULT MULTIVITAMIN W/MINERALS CH
1.0000 | ORAL_TABLET | Freq: Every day | ORAL | Status: DC
  Administered 2016-07-07 – 2016-07-10 (×4): 1 via ORAL
  Filled 2016-07-07 (×7): qty 1

## 2016-07-07 MED ORDER — CHLORDIAZEPOXIDE HCL 25 MG PO CAPS
25.0000 mg | ORAL_CAPSULE | ORAL | Status: AC
  Administered 2016-07-09 (×2): 25 mg via ORAL
  Filled 2016-07-07 (×2): qty 1

## 2016-07-07 MED ORDER — ALUM & MAG HYDROXIDE-SIMETH 200-200-20 MG/5ML PO SUSP: 30.0000 mL | ORAL | Status: DC | PRN

## 2016-07-07 MED ORDER — MAGNESIUM HYDROXIDE 400 MG/5ML PO SUSP: 30.0000 mL | Freq: Every day | ORAL | Status: DC | PRN
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
## 2016-07-07 NOTE — ED Notes (Signed)
Pt taken to shower by sitter and security. Pt ambulatory with NAD noted at this time

## 2016-07-07 NOTE — BH Assessment (Signed)
[facility] Assessment Progress Note   Clinician was informed by [facility] that there was a bed available at [facility] for patient.  Patient accepted to [facility] 303-1 to services of Dr. Sharolyn Douglas.  Nursing staff at [facility] to contact daytime [facility] at [phone] to coordinate a time for patient to be transported via [place] informed nurse Baldo Ash of the patient being accepted.  If patient declines voluntary admission, EDP needs to pursue IVC for patient (see note in EPIC).  Patient to sign voluntary and have paperwork faxed to [facility] ([phone]) prior to patient arrival.
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
## 2016-07-07 NOTE — Progress Notes (Signed)
Patient ID: Cynthia Church, female   DOB: 08-12-1966, 50 y.o.   MRN: [id number] Per State regulations 482.30 this chart was reviewed for medical necessity with respect to the patient's admission/duration of stay.    Next review date: 07/11/16  Debarah Crape, BSN, RN-BC  Case Manager

## 2016-07-07 NOTE — Progress Notes (Signed)
Patient did not attend the evening speaker [place] meeting. Pt was notified that group was beginning but returned to sleep.

## 2016-07-08 DIAGNOSIS — Z8489 Family history of other specified conditions: Secondary | ICD-10-CM

## 2016-07-08 DIAGNOSIS — Z9889 Other specified postprocedural states: Secondary | ICD-10-CM

## 2016-07-08 DIAGNOSIS — F323 Major depressive disorder, single episode, severe with psychotic features: Secondary | ICD-10-CM

## 2016-07-08 DIAGNOSIS — Z79899 Other long term (current) drug therapy: Secondary | ICD-10-CM

## 2016-07-08 MED ORDER — INFLUENZA VAC SPLIT QUAD 0.5 ML IM SUSY
0.5000 mL | PREFILLED_SYRINGE | Freq: Once | INTRAMUSCULAR | Status: AC
  Administered 2016-07-08: 0.5 mL via INTRAMUSCULAR
  Filled 2016-07-08: qty 0.5

## 2016-07-08 MED ORDER — PNEUMOCOCCAL VAC POLYVALENT 25 MCG/0.5ML IJ INJ
0.5000 mL | INJECTION | Freq: Once | INTRAMUSCULAR | Status: AC
  Administered 2016-07-08: 0.5 mL via INTRAMUSCULAR

## 2016-07-08 MED ORDER — NICOTINE 21 MG/24HR TD PT24
21.0000 mg | MEDICATED_PATCH | Freq: Every day | TRANSDERMAL | Status: DC
  Administered 2016-07-08 – 2016-07-10 (×3): 21 mg via TRANSDERMAL
  Filled 2016-07-08 (×4): qty 1

## 2016-07-08 MED ORDER — METOPROLOL TARTRATE 25 MG PO TABS
25.0000 mg | ORAL_TABLET | Freq: Two times a day (BID) | ORAL | Status: DC
  Administered 2016-07-08 – 2016-07-10 (×5): 25 mg via ORAL
  Filled 2016-07-08 (×10): qty 1

## 2016-07-08 MED ORDER — QUETIAPINE FUMARATE ER 50 MG PO TB24
50.0000 mg | ORAL_TABLET | Freq: Every day | ORAL | Status: DC
  Administered 2016-07-08: 50 mg via ORAL
  Filled 2016-07-08 (×2): qty 1
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
## 2016-07-08 NOTE — BHH Group Notes (Signed)
[place] Group Notes:  (Nursing/MHT/Case Management/Adjunct)  Date:  07/08/2016  Time:  1315  Type of Therapy:  Nurse Education  Participation Level:  Did Not Attend  Participation Quality:    Affect:    Cognitive:    Insight:    Engagement in Group:    Modes of Intervention:    Summary of Progress/Problems: Patient invited to attend group however elected to remain in bed.  Loletta Specter [facility] 07/08/2016, 6:06 PM
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
## 2016-07-08 NOTE — Plan of Care (Signed)
Problem: Safety: Goal: Ability to disclose and discuss suicidal ideas will improve Outcome: Progressing Patient denies SI, thoughts to self harm.  Problem: Medication: Goal: Compliance with prescribed medication regimen will improve Outcome: Progressing Patient has been med compliant.

## 2016-07-08 NOTE — BHH Group Notes (Signed)
Adult Therapy Group Note  Date: 07/08/2016  Time:  10:00-11:00AM  Group Topic/Focus: Healthy Support Systems   Building Self Esteem:   The focus of this group was to assist patients in identifying their current healthy supports and unhealthy supports, then discussing how to add additional healthy supports and reduce existing unhealthy ones.  Cards with names of possible healthy supports on them were divided up and shared by group members, The healthy additions included supports such as 12-step groups, individual therapy, psychiatrists, faith activities,  sponsors, group therapy, support groups, classes on mental health, and more.    Participation Level:  Active  Participation Quality:  Attentive and Sharing  Affect:  Blunted and Depressed  Cognitive:  Appropriate  Insight: Good  Engagement in Group:  Engaged  Modes of Intervention:  Discussion and Support  Additional Comments:  The patient expressed that a current healthy support is God, a friend, and her dog, while her unhealthy supports include her husband because she drinks and drugs with him.  Maretta Los, LCSW 07/08/2016  2:43 PM

## 2016-07-08 NOTE — Progress Notes (Signed)
Patient did not attend the evening speaker AA meeting. Pt was notified that group was beginning but remained in bed.   

## 2016-07-08 NOTE — Progress Notes (Signed)
D: Pt at the time of assessment denies depression, anxiety, pain, SI, HI or AVH. Pt is however flat isolative and withdrawn to room; Pt was in bed all evening. Pt at the time of assessment did not look to be in any distress. A: Medications offered as prescribed.  Support, encouragement, and safe environment provided.  15-minute safety checks continue. R: Pt was med compliant.  Pt did not attend AA group. Safety checks continue.

## 2016-07-09 DIAGNOSIS — F322 Major depressive disorder, single episode, severe without psychotic features: Secondary | ICD-10-CM

## 2016-07-09 MED ORDER — TRAZODONE HCL 50 MG PO TABS
50.0000 mg | ORAL_TABLET | Freq: Every evening | ORAL | Status: DC | PRN
  Administered 2016-07-09: 50 mg via ORAL
  Filled 2016-07-09: qty 1

## 2016-07-09 NOTE — Progress Notes (Signed)
Recreation Therapy Notes  Date: 07/09/16 Time: 0930 Location: [address] Dayroom  Group Topic: Coping Skills  Goal Area(s) Addresses:  Pt will be able to identify positive coping skills. Pt will be able to identify the importance of coping skills. Pt will be able to identify effects of coping skills post d/c?  Intervention: Magazines, scissors, glue sticks, construction paper, coping skills worksheet  Activity: Patients were given a worksheet divided into five areas: diversions, cognitive, social, tension releasers and physical.  Patients were to look through the magazines and find pictures that display coping skills that could be used for each area.  If the patient couldn't find a picture for the coping skill they wanted, they could write it in.  Education: Radiographer, therapeutic, Dentist.   Education Outcome: Acknowledges understanding/In group clarification offered/Needs additional education.   Clinical Observations/Feedback: Pt did not attend group.    Victorino Sparrow, LRT/CTRS         Victorino Sparrow A 07/09/2016 12:24 PM

## 2016-07-09 NOTE — Progress Notes (Signed)
Patient ID: Cynthia Church, female   DOB: Jul 17, 1966, 50 y.o.   MRN: [id number]  D: Patient in room resting most of the evening. Pt complains of rt knee pain during medication pass. Denies  SI/HI/AVH and withdrawal systems. Pt reports goal after discharge to get into a treatment center in [place].No behavioral issues noted.  A: Support and encouragement offered as needed. Medications administered as prescribed.  R: Patient is safe and cooperative on the unit. Pt did not attend evening wrap up group.

## 2016-07-09 NOTE — BHH Group Notes (Signed)
[facility] LCSW Group Therapy  07/09/2016 2:44 PM  Type of Therapy:  Group Therapy  Participation Level:  Active  Participation Quality:  Attentive  Affect:  Irritable  Cognitive:  Oriented  Insight:  Improving  Engagement in Therapy:  Improving  Modes of Intervention:  Confrontation, Discussion, Education, Exploration, Problem-solving, Rapport Building, Socialization and Support  Summary of Progress/Problems: Today's Topic: Overcoming Obstacles. Patients identified one short term goal and potential obstacles in reaching this goal. Patients processed barriers involved in overcoming these obstacles. Patients identified steps necessary for overcoming these obstacles and explored motivation (internal and external) for facing these difficulties head on. Cynthia Church was attentive and engaged during today's processing group. She shared that her biggest obstacle is "finding a psychiatrist." She was open to allowing CSW to assist with this process. Cynthia Church had difficulty managing her frustration in the group setting when others were sharing. She continues to present with irritable mood but is redirectable. She shows moderate insight and some progress in the group setting.    Vestal Markin N Smart LCSW 07/09/2016, 2:44 PM

## 2016-07-09 NOTE — Progress Notes (Signed)
[facility] MD Progress Note  07/09/2016 2:09 PM  Patient Active Problem List   Diagnosis Date Noted  . MDD (major depressive disorder) 07/07/2016  . COPD without exacerbation ([place]) 06/05/2016  . Tobacco abuse 06/05/2016  . Bipolar disorder ([place]) 06/05/2016  . Chronic pain syndrome 06/05/2016  . 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 Abdominal aortic atherosclerosis ([place]) 06/05/2016  . Hypothyroidism 06/05/2016  . Alcohol dependence ([place]) 09/29/2011    Diagnosis: Alcohol Use disorder, severe  Subjective: Patient admitted over the weekend blood alcohol level 307, UDS positive for marijuana. Patient is discursive and confused historian and peppers her speech liberally with curse words. She states "I'm about to put in a 72 hour letter" and agrees that she is ready to go tomorrow. She denies any suicidal or homicidal ideation, plan or intent and states that she just said that so she could get admitted. She denies any current complicated detox  Objective: Well-developed well nourished slightly disheveled older woman in no apparent distress she is friendly and pleasant but rather discursive speech and motor appear to be within normal limits mood is described as alright and affect is a bit labile but redirectable thought processes are somewhat disorganized thought content denies any current suicidal or homicidal ideation, plan or intent alert, IQ appears an low average range insight and judgment are limited to fair Last CT of the head without contrast 03/05/16 was unremarkable    Current Facility-Administered Medications (Cardiovascular):  .  metoprolol tartrate (LOPRESSOR) tablet 25 mg     Current Facility-Administered Medications (Analgesics):  .  acetaminophen (TYLENOL) tablet 650 mg .  ibuprofen (ADVIL,MOTRIN) tablet 600 mg     Current Facility-Administered Medications (Other):  .  alum & mag hydroxide-simeth (MAALOX/MYLANTA) 200-200-20 MG/5ML suspension 30 mL .  chlordiazePOXIDE  (LIBRIUM) capsule 25 mg .  [COMPLETED] chlordiazePOXIDE (LIBRIUM) capsule 25 mg **FOLLOWED BY** [COMPLETED] chlordiazePOXIDE (LIBRIUM) capsule 25 mg **FOLLOWED BY** chlordiazePOXIDE (LIBRIUM) capsule 25 mg **FOLLOWED BY** [START ON 07/10/2016] chlordiazePOXIDE (LIBRIUM) capsule 25 mg .  hydrOXYzine (ATARAX/VISTARIL) tablet 25 mg .  loperamide (IMODIUM) capsule 2-4 mg .  magnesium hydroxide (MILK OF MAGNESIA) suspension 30 mL .  multivitamin with minerals tablet 1 tablet .  nicotine (NICODERM CQ - dosed in mg/24 hours) patch 21 mg .  ondansetron (ZOFRAN-ODT) disintegrating tablet 4 mg .  thiamine (B-1) injection 100 mg .  thiamine (VITAMIN B-1) tablet 100 mg .  traZODone (DESYREL) tablet 50 mg  No current outpatient prescriptions on file.  Vital Signs:Blood pressure 114/88, pulse 86, temperature 97.7 F (36.5 C), temperature source Oral, resp. rate 20, height 5' (1.524 m), weight 50.8 kg (112 lb).    Lab Results: No results found for this or any previous visit (from the past 48 hour(s)).  Physical Findings: AIMS:  , ,  ,  ,    CIWA:  CIWA-Ar Total: 3 COWS:      Assessment/Plan: Patient denies any significant detox symptoms and denies suicidal or homicidal ideation, plan or intent and reports wishing to be released to outpatient follow-up. She has been prescribed Seroquel XR 50 mg at at bedtime but she is unsure of the reason and does not endorse any current psychotic symptoms. This will be changed to trazodone 50 mg by mouth daily at bedtime when necessary for sleep. Patient continues to deny suicidal or homicidal ideation, plan or intent and does not have any acute issues the plan is to discharge her per her request tomorrow.  Linard Millers, MD 07/09/2016, 2:09 PM

## 2016-07-09 NOTE — Progress Notes (Signed)
Patient ID: Cynthia Church, female   DOB: January 27, 1966, 50 y.o.   MRN: [id number]  Pt currently presents with a flat affect and superficial behavior. Pt speech is slurred at baseline. Pt reports to Probation officer that their goal is to "go home tomorrow." Pt states "I am not really ready to leave." Pt reports good sleep with current medication regimen. Pt denies any current cravings, reports her day was better today.   Pt provided with medications per providers orders. Pt's labs and vitals were monitored throughout the night. Pt supported emotionally and encouraged to express concerns and questions. Pt educated on medications.  Pt's safety ensured with 15 minute and environmental checks. Pt currently denies SI/HI and A/V hallucinations. Pt verbally agrees to seek staff if SI/HI or A/VH occurs and to consult with staff before acting on any harmful thoughts. Will continue POC.

## 2016-07-09 NOTE — Tx Team (Signed)
Interdisciplinary Treatment and Diagnostic Plan Update  07/09/2016 Time of Session: 9:30AM Cynthia Church MRN: [id number]  Principal Diagnosis: MDD (major depressive disorder)  Secondary Diagnoses: Principal Problem:   MDD (major depressive disorder)   Current Medications:  Current Facility-Administered Medications  Medication Dose Route Frequency Provider Last Rate Last Dose  . acetaminophen (TYLENOL) tablet 650 mg  650 mg Oral Q6H PRN Derrill Center, NP   650 mg at 07/09/16 0550  . alum & mag hydroxide-simeth (MAALOX/MYLANTA) 200-200-20 MG/5ML suspension 30 mL  30 mL Oral Q4H PRN Derrill Center, NP      . chlordiazePOXIDE (LIBRIUM) capsule 25 mg  25 mg Oral Q6H PRN Derrill Center, NP      . chlordiazePOXIDE (LIBRIUM) capsule 25 mg  25 mg Oral BH-qamhs Derrill Center, NP       Followed by  . [START ON 07/10/2016] chlordiazePOXIDE (LIBRIUM) capsule 25 mg  25 mg Oral Daily Derrill Center, NP      . hydrOXYzine (ATARAX/VISTARIL) tablet 25 mg  25 mg Oral Q6H PRN Derrill Center, NP   25 mg at 07/09/16 0125  . ibuprofen (ADVIL,MOTRIN) tablet 600 mg  600 mg Oral Q8H PRN Derrill Center, NP   600 mg at 07/08/16 1020  . loperamide (IMODIUM) capsule 2-4 mg  2-4 mg Oral PRN Derrill Center, NP      . magnesium hydroxide (MILK OF MAGNESIA) suspension 30 mL  30 mL Oral Daily PRN Derrill Center, NP      . metoprolol tartrate (LOPRESSOR) tablet 25 mg  25 mg Oral BID Derrill Center, NP   25 mg at 07/08/16 1719  . multivitamin with minerals tablet 1 tablet  1 tablet Oral Daily Derrill Center, NP   1 tablet at 07/08/16 [id number]  . nicotine (NICODERM CQ - dosed in mg/24 hours) patch 21 mg  21 mg Transdermal Daily Jenne Campus, MD   21 mg at 07/08/16 0828  . ondansetron (ZOFRAN-ODT) disintegrating tablet 4 mg  4 mg Oral Q6H PRN Derrill Center, NP      . QUEtiapine (SEROQUEL XR) 24 hr tablet 50 mg  50 mg Oral QHS Derrill Center, NP   50 mg at 07/08/16 2117  . thiamine (B-1) injection 100 mg  100 mg  Intramuscular Once Derrill Center, NP      . thiamine (VITAMIN B-1) tablet 100 mg  100 mg Oral Daily Derrill Center, NP   100 mg at 07/08/16 [id number]   PTA Medications: Prescriptions Prior to Admission  Medication Sig Dispense Refill Last Dose  . ibuprofen (ADVIL,MOTRIN) 200 MG tablet Take 400 mg by mouth every 6 (six) hours as needed for mild pain or moderate pain.   Past Week at Unknown time    Patient Stressors: Substance abuse  Patient Strengths: Ability for insight General fund of knowledge  Treatment Modalities: Medication Management, Group therapy, Case management,  1 to 1 session with clinician, Psychoeducation, Recreational therapy.   Physician Treatment Plan for Primary Diagnosis: MDD (major depressive disorder) Long Term Goal(s): Improvement in symptoms so as ready for discharge Improvement in symptoms so as ready for discharge   Short Term Goals: Ability to identify changes in lifestyle to reduce recurrence of condition will improve Ability to verbalize feelings will improve Ability to disclose and discuss suicidal ideas Ability to identify and develop effective coping behaviors will improve Ability to maintain clinical measurements within normal limits will improve Ability to identify  triggers associated with substance abuse/mental health issues will improve  Medication Management: Evaluate patient's response, side effects, and tolerance of medication regimen.  Therapeutic Interventions: 1 to 1 sessions, Unit Group sessions and Medication administration.  Evaluation of Outcomes: Progressing  Physician Treatment Plan for Secondary Diagnosis: Principal Problem:   MDD (major depressive disorder)  Long Term Goal(s): Improvement in symptoms so as ready for discharge Improvement in symptoms so as ready for discharge   Short Term Goals: Ability to identify changes in lifestyle to reduce recurrence of condition will improve Ability to verbalize feelings will  improve Ability to disclose and discuss suicidal ideas Ability to identify and develop effective coping behaviors will improve Ability to maintain clinical measurements within normal limits will improve Ability to identify triggers associated with substance abuse/mental health issues will improve     Medication Management: Evaluate patient's response, side effects, and tolerance of medication regimen.  Therapeutic Interventions: 1 to 1 sessions, Unit Group sessions and Medication administration.  Evaluation of Outcomes: Progressing   RN Treatment Plan for Primary Diagnosis: MDD (major depressive disorder) Long Term Goal(s): Knowledge of disease and therapeutic regimen to maintain health will improve  Short Term Goals: Ability to remain free from injury will improve, Ability to disclose and discuss suicidal ideas and Ability to identify and develop effective coping behaviors will improve  Medication Management: RN will administer medications as ordered by provider, will assess and evaluate patient's response and provide education to patient for prescribed medication. RN will report any adverse and/or side effects to prescribing provider.  Therapeutic Interventions: 1 on 1 counseling sessions, Psychoeducation, Medication administration, Evaluate responses to treatment, Monitor vital signs and CBGs as ordered, Perform/monitor CIWA, COWS, AIMS and Fall Risk screenings as ordered, Perform wound care treatments as ordered.  Evaluation of Outcomes: Progressing   LCSW Treatment Plan for Primary Diagnosis: MDD (major depressive disorder) Long Term Goal(s): Safe transition to appropriate next level of care at discharge, Engage patient in therapeutic group addressing interpersonal concerns.  Short Term Goals: Engage patient in aftercare planning with referrals and resources, Facilitate patient progression through stages of change regarding substance use diagnoses and concerns and Identify triggers  associated with mental health/substance abuse issues  Therapeutic Interventions: Assess for all discharge needs, 1 to 1 time with Social worker, Explore available resources and support systems, Assess for adequacy in community support network, Educate family and significant other(s) on suicide prevention, Complete Psychosocial Assessment, Interpersonal group therapy.  Evaluation of Outcomes: Progressing   Progress in Treatment: Attending groups: Yes. Participating in groups: Yes. Taking medication as prescribed: Yes. Toleration medication: Yes. Family/Significant other contact made: No, will contact:  family member if patient consents Patient understands diagnosis: Yes. Discussing patient identified problems/goals with staff: Yes. Medical problems stabilized or resolved: Yes. Denies suicidal/homicidal ideation: Yes. Issues/concerns per patient self-inventory: No. Other: n/a   New problem(s) identified: No, Describe:  n/a   New Short Term/Long Term Goal(s): medication stabilization, detox, and development of comprehensive mental wellness/sobriety plan.   Discharge Plan or Barriers: Pt plans to return home; follow[facility]up at [facility].   Reason for Continuation of Hospitalization: Depression Medication stabilization Withdrawal symptoms  Estimated Length of Stay: 2-3 days   Attendees: Patient: 07/09/2016 8:30 AM  Physician: Dr. Sharolyn Douglas MD 07/09/2016 8:30 AM  Nursing: Mariea Clonts RN 07/09/2016 8:30 AM  RN Care Manager: Lars Pinks CM 07/09/2016 8:30 AM  Social Worker: Maxie Better, LCSW 07/09/2016 8:30 AM  Recreational Therapist:  07/09/2016 8:30 AM  Other:  Ricky Ala  NP; Catalina Pizza NP 07/09/2016 8:30 AM  Other:  07/09/2016 8:30 AM  Other: 07/09/2016 8:30 AM    Scribe for Treatment Team: [place], LCSW 07/09/2016 8:30 AM

## 2016-07-09 NOTE — Progress Notes (Signed)
D:  Patient's self inventory sheet, patient has poor sleep, sleep medication is not helpful.  Poor appetite, high, hyper energy level, poor concentration.  Depression "very mad", hopeless and anxiety #10.  Withdrawals, agitation, irritability, high.  Denied SI.  Physical problems, pain, headaches, "very very mad upset".  Denied pain.   Headache, pain medication is working.  Goal is to calm down, learn how to handle situations better with alittle more patience and without so much anger.  Plans to talk to SW.  Takes her alittle while to calm down after being upset. A:  Medications administered per MD order.  Emotional support and encouragement given patient. R:  Denied SI and HI, contracts for safety while talking to nurse this morning.  Denied A/V hallucinations.  Safety maintained with 15 minute checks.

## 2016-07-09 NOTE — Progress Notes (Signed)
Psychoeducational Group Note  Date:  07/09/2016 Time:  2246  Group Topic/Focus:  Wrap-Up Group:   The focus of this group is to help patients review their daily goal of treatment and discuss progress on daily workbooks.   Participation Level: Did Not Attend  Participation Quality:  Not Applicable  Affect:  Not Applicable  Cognitive:  Not Applicable  Insight:  Not Applicable  Engagement in Group: Not Applicable  Additional Comments: The patient did not attend group since she remained in her bed.    Archie Balboa S 07/09/2016, 10:46 PM

## 2016-07-09 NOTE — Plan of Care (Signed)
Problem: Activity: Goal: Interest or engagement in leisure activities will improve Outcome: Progressing Nurse discussed depression/coping skills with patient.

## 2016-07-09 NOTE — BHH Suicide Risk Assessment (Signed)
[facility] Discharge Suicide Risk Assessment   Principal Problem: MDD (major depressive disorder) Discharge Diagnoses:  Patient Active Problem List   Diagnosis Date Noted  . MDD (major depressive disorder) [F32.9] 07/07/2016  . COPD without exacerbation ([place]) [J44.9] 06/05/2016  . Tobacco abuse [Z72.0] 06/05/2016  . Bipolar disorder ([place]) [F31.9] 06/05/2016  . Chronic pain syndrome [G89.4] 06/05/2016  . 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 Abdominal aortic atherosclerosis ([place]) [I70.0] 06/05/2016  . Hypothyroidism [E03.9] 06/05/2016  . Alcohol dependence ([place]) [F10.20] 09/29/2011    Total Time spent with patient: 15 minutes  Musculoskeletal: Strength & Muscle Tone: within normal limits Gait & Station: normal Patient leans: N/A  Psychiatric Specialty Exam: ROS  Blood pressure 124/88, pulse 71, temperature 97.7 F (36.5 C), temperature source Oral, resp. rate 20, height 5' (1.524 m), weight 50.8 kg (112 lb).Body mass index is 21.87 kg/m.  General Appearance: Casual  Eye Contact::  Fair  Speech:  Clear and Coherent409  Volume:  Normal  Mood:  Anxious  Affect:  Congruent  Thought Process:  Coherent  Orientation:  Negative  Thought Content:  Negative  Suicidal Thoughts:  No  Homicidal Thoughts:  No  Memory:  Negative  Judgement:  Fair  Insight:  Present  Psychomotor Activity:  Normal  Concentration:  Fair  Recall:  [employer] of Knowledge:Fair  Language: Fair  Akathisia:  No  Handed:  Right  AIMS (if indicated):     Assets:  Resilience  Sleep:  Number of Hours: 5.25  Cognition: WNL  ADL's:  Intact   Mental Status Per Nursing Assessment::   On Admission:  NA  Demographic Factors:  Low socioeconomic status  Loss Factors: Loss of significant relationship  Historical Factors: NA  Risk Reduction Factors:   Living with another person, especially a relative  Continued Clinical Symptoms:  Alcohol/Substance Abuse/Dependencies  Cognitive Features  That Contribute To Risk:  None    Suicide Risk:  Mild:  Suicidal ideation of limited frequency, intensity, duration, and specificity.  There are no identifiable plans, no associated intent, mild dysphoria and related symptoms, good self-control (both objective and subjective assessment), few other risk factors, and identifiable protective factors, including available and accessible social support.  Follow-up Information    [facility] Follow up on 07/16/2016.   Why:  Appt for counseling on this date at 10:00AM. Your Medication management appt will be scheduled at your 2nd counseling appt. Thank you.  Contact information: [address]  [place] [address] [phone]           Plan Of Care/Follow-up recommendations:  Other:  Patient denies any suicidal or homicidal ideation, plan or intent at time of discharge. She should keep her scheduled follow-up appointment for further treatment.  Linard Millers, MD 07/09/2016, 3:02 PM

## 2016-07-09 NOTE — Plan of Care (Signed)
Problem: Health Behavior/Discharge Planning: Goal: Identification of resources available to assist in meeting health care needs will improve Outcome: Progressing Pt plans to go a treatment center after discharge.

## 2016-07-10 MED ORDER — NICOTINE 21 MG/24HR TD PT24: 21.0000 mg | MEDICATED_PATCH | Freq: Every day | TRANSDERMAL | 0 refills | Status: DC

## 2016-07-10 MED ORDER — HYDROXYZINE HCL 25 MG PO TABS: 25.0000 mg | ORAL_TABLET | Freq: Four times a day (QID) | ORAL | 0 refills | Status: DC | PRN

## 2016-07-10 MED ORDER — TRAZODONE HCL 50 MG PO TABS: 50.0000 mg | ORAL_TABLET | Freq: Every evening | ORAL | 0 refills | Status: DC | PRN

## 2016-07-10 MED ORDER — METOPROLOL TARTRATE 25 MG PO TABS: 25.0000 mg | ORAL_TABLET | Freq: Two times a day (BID) | ORAL | 0 refills | Status: DC

## 2016-07-10 NOTE — Tx Team (Signed)
Interdisciplinary Treatment and Diagnostic Plan Update  07/10/2016 Time of Session: 9:30AM Cynthia Church MRN: [id number]  Principal Diagnosis: MDD (major depressive disorder)  Secondary Diagnoses: Principal Problem:   MDD (major depressive disorder)   Current Medications:  Current Facility-Administered Medications  Medication Dose Route Frequency Provider Last Rate Last Dose  . acetaminophen (TYLENOL) tablet 650 mg  650 mg Oral Q6H PRN Derrill Center, NP   650 mg at 07/10/16 0741  . alum & mag hydroxide-simeth (MAALOX/MYLANTA) 200-200-20 MG/5ML suspension 30 mL  30 mL Oral Q4H PRN Derrill Center, NP      . chlordiazePOXIDE (LIBRIUM) capsule 25 mg  25 mg Oral Q6H PRN Derrill Center, NP   25 mg at 07/10/16 0254  . hydrOXYzine (ATARAX/VISTARIL) tablet 25 mg  25 mg Oral Q6H PRN Derrill Center, NP   25 mg at 07/10/16 0254  . ibuprofen (ADVIL,MOTRIN) tablet 600 mg  600 mg Oral Q8H PRN Derrill Center, NP   600 mg at 07/10/16 0254  . loperamide (IMODIUM) capsule 2-4 mg  2-4 mg Oral PRN Derrill Center, NP      . magnesium hydroxide (MILK OF MAGNESIA) suspension 30 mL  30 mL Oral Daily PRN Derrill Center, NP      . metoprolol tartrate (LOPRESSOR) tablet 25 mg  25 mg Oral BID Derrill Center, NP   25 mg at 07/10/16 0733  . multivitamin with minerals tablet 1 tablet  1 tablet Oral Daily Derrill Center, NP   1 tablet at 07/10/16 0733  . nicotine (NICODERM CQ - dosed in mg/24 hours) patch 21 mg  21 mg Transdermal Daily Jenne Campus, MD   21 mg at 07/10/16 0733  . ondansetron (ZOFRAN-ODT) disintegrating tablet 4 mg  4 mg Oral Q6H PRN Derrill Center, NP      . thiamine (B-1) injection 100 mg  100 mg Intramuscular Once Derrill Center, NP      . thiamine (VITAMIN B-1) tablet 100 mg  100 mg Oral Daily Derrill Center, NP   100 mg at 07/10/16 0734  . traZODone (DESYREL) tablet 50 mg  50 mg Oral QHS PRN Linard Millers, MD   50 mg at 07/09/16 2147   PTA Medications: Prescriptions Prior to  Admission  Medication Sig Dispense Refill Last Dose  . ibuprofen (ADVIL,MOTRIN) 200 MG tablet Take 400 mg by mouth every 6 (six) hours as needed for mild pain or moderate pain.   Past Week at Unknown time    Patient Stressors: Substance abuse  Patient Strengths: Ability for insight General fund of knowledge  Treatment Modalities: Medication Management, Group therapy, Case management,  1 to 1 session with clinician, Psychoeducation, Recreational therapy.   Physician Treatment Plan for Primary Diagnosis: MDD (major depressive disorder) Long Term Goal(s): Improvement in symptoms so as ready for discharge Improvement in symptoms so as ready for discharge   Short Term Goals: Ability to identify changes in lifestyle to reduce recurrence of condition will improve Ability to verbalize feelings will improve Ability to disclose and discuss suicidal ideas Ability to identify and develop effective coping behaviors will improve Ability to maintain clinical measurements within normal limits will improve Ability to identify triggers associated with substance abuse/mental health issues will improve  Medication Management: Evaluate patient's response, side effects, and tolerance of medication regimen.  Therapeutic Interventions: 1 to 1 sessions, Unit Group sessions and Medication administration.  Evaluation of Outcomes: Met  Physician Treatment Plan for Secondary  Diagnosis: Principal Problem:   MDD (major depressive disorder)  Long Term Goal(s): Improvement in symptoms so as ready for discharge Improvement in symptoms so as ready for discharge   Short Term Goals: Ability to identify changes in lifestyle to reduce recurrence of condition will improve Ability to verbalize feelings will improve Ability to disclose and discuss suicidal ideas Ability to identify and develop effective coping behaviors will improve Ability to maintain clinical measurements within normal limits will improve Ability  to identify triggers associated with substance abuse/mental health issues will improve     Medication Management: Evaluate patient's response, side effects, and tolerance of medication regimen.  Therapeutic Interventions: 1 to 1 sessions, Unit Group sessions and Medication administration.  Evaluation of Outcomes: Met   RN Treatment Plan for Primary Diagnosis: MDD (major depressive disorder) Long Term Goal(s): Knowledge of disease and therapeutic regimen to maintain health will improve  Short Term Goals: Ability to remain free from injury will improve, Ability to disclose and discuss suicidal ideas and Ability to identify and develop effective coping behaviors will improve  Medication Management: RN will administer medications as ordered by provider, will assess and evaluate patient's response and provide education to patient for prescribed medication. RN will report any adverse and/or side effects to prescribing provider.  Therapeutic Interventions: 1 on 1 counseling sessions, Psychoeducation, Medication administration, Evaluate responses to treatment, Monitor vital signs and CBGs as ordered, Perform/monitor CIWA, COWS, AIMS and Fall Risk screenings as ordered, Perform wound care treatments as ordered.  Evaluation of Outcomes: Met  LCSW Treatment Plan for Primary Diagnosis: MDD (major depressive disorder) Long Term Goal(s): Safe transition to appropriate next level of care at discharge, Engage patient in therapeutic group addressing interpersonal concerns.  Short Term Goals: Engage patient in aftercare planning with referrals and resources, Facilitate patient progression through stages of change regarding substance use diagnoses and concerns and Identify triggers associated with mental health/substance abuse issues  Therapeutic Interventions: Assess for all discharge needs, 1 to 1 time with Social worker, Explore available resources and support systems, Assess for adequacy in community  support network, Educate family and significant other(s) on suicide prevention, Complete Psychosocial Assessment, Interpersonal group therapy.  Evaluation of Outcomes: Met   Progress in Treatment: Attending groups: Yes. Participating in groups: Yes. Taking medication as prescribed: Yes. Toleration medication: Yes. Family/Significant other contact made: SPE completed with pt's friend/Health care power of attorney Patient understands diagnosis: Yes. Discussing patient identified problems/goals with staff: Yes. Medical problems stabilized or resolved: Yes. Denies suicidal/homicidal ideation: Yes. Issues/concerns per patient self-inventory: No. Other: n/a   New problem(s) identified: No, Describe:  n/a   New Short Term/Long Term Goal(s): medication stabilization, detox, and development of comprehensive mental wellness/sobriety plan.   Discharge Plan or Barriers: Pt plans to return home; follow[facility].   Reason for Continuation of Hospitalization: none  Estimated Length of Stay: d/c today at 11am  Attendees: Patient: 07/10/2016 9:05 AM  Physician: Dr. Sharolyn Douglas MD 07/10/2016 9:05 AM  Nursing: Mariea Clonts RN 07/10/2016 9:05 AM  RN Care Manager: Lars Pinks CM 07/10/2016 9:05 AM  Social Worker: Press photographer, LCSW 07/10/2016 9:05 AM  Recreational Therapist:  07/10/2016 9:05 AM  Other:  Ricky Ala NP; Catalina Pizza NP 07/10/2016 9:05 AM  Other:  07/10/2016 9:05 AM  Other: 07/10/2016 9:05 AM    Scribe for Treatment Team: Kimber Relic Smart, LCSW 07/10/2016 9:05 AM
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
## 2016-07-10 NOTE — BHH Group Notes (Signed)
The focus of this group is to educate the patient on the purpose and policies of crisis stabilization and provide a format to answer questions about their admission.  The group details unit policies and expectations of patients while admitted.  Patient did not attend 0900 nurse education orientation group this morning.  Patient stayed in her room. 
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
## 2016-07-10 NOTE — Progress Notes (Signed)
D:  Patient's self inventory sheet, patient sleeps poor, sleep medication is not helpful.  Good appetite, low energy level, good concentration.  Denied depression, rated anxiety 5.  Denied withdrawals, then checked agitation and irritability.  No patience.  Denied SI while talking to nurse this morning, contracts for safety.  Physical problems, slow function, headaches.  Physical pain, knees, pain medicine helpful yes/no.  Goal is to get everything right and perfect for discharge.  Does have discharge plans. A:  Medications administered per MD orders.  Emotional support and encouragement given patient. R:  Patient denied SI and Hi, contracts for safety, while talking to nurse this morning.  Denied A/V hallucinations. Safety maintained with 15 minute checks.

## 2016-07-10 NOTE — BHH Suicide Risk Assessment (Signed)
[place] INPATIENT:  Family/Significant Other Suicide Prevention Education  Suicide Prevention Education:  Education Completed; [name] (pt's friend) [phone] has been identified by the patient as the family member/significant other with whom the patient will be residing, and identified as the person(s) who will aid the patient in the event of a mental health crisis (suicidal ideations/suicide attempt).  With written consent from the patient, the family member/significant other has been provided the following suicide prevention education, prior to the and/or following the discharge of the patient.  The suicide prevention education provided includes the following:  Suicide risk factors  Suicide prevention and interventions  National Suicide Hotline telephone number  [facility] assessment telephone number  [facility] Emergency Assistance [place] and/or Residential Mobile Crisis Unit telephone number  Request made of family/significant other to:  Remove weapons (e.g., guns, rifles, knives), all items previously/currently identified as safety concern.    Remove drugs/medications (over-the-counter, prescriptions, illicit drugs), all items previously/currently identified as a safety concern.  The family member/significant other verbalizes understanding of the suicide prevention education information provided.  The family member/significant other agrees to remove the items of safety concern listed above.  Cosmo Tetreault N Smart LCSW 07/10/2016, 9:01 AM

## 2016-07-10 NOTE — Progress Notes (Signed)
  [facility] Adult Case Management Discharge Plan :  Will you be returning to the same living situation after discharge:  Yes,  home At discharge, do you have transportation home?: Yes,  friend Do you have the ability to pay for your medications: Yes,  Medicare  Release of information consent forms completed and submitted to medical records.  Patient to Follow up at: Follow-up Information    [facility] Follow up on 07/16/2016.   Why:  Appt for counseling on this date at 10:00AM. Please arrive 20 minutes early.  Your Medication management appt will be scheduled at your 2nd counseling appt. Please bring Photo ID and Medicaid card with you to this appt. Thank you.  Contact information: [address]  [place] [address] [phone]           Next level of care provider has access to [place] and Suicide Prevention discussed: Yes,  SPE completed with pt's friend/power of attorney. SPI pamphlet and Mobile Crisis information provided to pt.  Have you used any form of tobacco in the last 30 days? (Cigarettes, Smokeless Tobacco, Cigars, and/or Pipes): Patient Refused Screening  Has patient been referred to the Quitline?: Patient refused referral  Patient has been referred for addiction treatment: Yes  Kenshin Splawn N Smart LCSW 07/10/2016, 9:02 AM

## 2016-07-10 NOTE — B[facility] Group Notes (Deleted)
Cynthia Church was invited to group. Did not attend     Pt attended spiritual care group on grief and loss facilitated by chaplain Jerene Pitch   Group opened with brief discussion and psycho-social ed around grief and loss in relationships and in relation to self - identifying life patterns, circumstances, changes that cause losses. Established group norm of speaking from own life experience. Group goal of establishing open and affirming space for members to share loss and experience with grief, normalize grief experience and provide psycho social education and grief support.

## 2016-07-10 NOTE — Progress Notes (Signed)
Discharge:  Patient's family member picked her up to take her home.  Patient denied SI and HI.  Denied A/V hallucinations.  Suicide prevention information given and discussed with patient who stated she understood and had no questions.  Patient stated she received all her belongings.  Patient stated stated she appreciated all assistance received from [facility] staff.  All required discharge information given to patient at discharge.

## 2016-07-11 NOTE — Progress Notes (Signed)
Pt's friend and heath care power of attorney [name] (pt's friend) [phone] called stating that pt did not receive her prescriptions. CSW asked him to look through her folder and all paperwork as pt may have misplaced paperwork. He could not find prescriptions. CSW asked for pharmacy information ([facility] in [place])-[phone] and relayed message to Lindell Spar regarding request to call in prescriptions for patient. Aggie stated that she would give message to May Augustin NP, who wrote patient's discharge summary yesterday 11/28.  Maxie Better, MSW, LCSW Clinical Social Worker 07/11/2016 3:41 PM

## 2016-08-12 ENCOUNTER — Emergency Department (HOSPITAL_COMMUNITY)
Admission: EM | Admit: 2016-08-12 | Discharge: 2016-08-12 | Disposition: A | Discharge status: Home / Self Care | Payer: Medicare Other | Attending: Emergency Medicine | Admitting: Emergency Medicine

## 2016-08-12 ENCOUNTER — Encounter (HOSPITAL_COMMUNITY): Payer: Self-pay | Admitting: *Deleted

## 2016-08-12 ENCOUNTER — Emergency Department (HOSPITAL_COMMUNITY): Payer: Medicare Other

## 2016-08-12 DIAGNOSIS — R079 Chest pain, unspecified: Secondary | ICD-10-CM | POA: Diagnosis present

## 2016-08-12 DIAGNOSIS — M545 Low back pain, unspecified: Secondary | ICD-10-CM

## 2016-08-12 DIAGNOSIS — Z791 Long term (current) use of non-steroidal anti-inflammatories (NSAID): Secondary | ICD-10-CM | POA: Diagnosis not present

## 2016-08-12 DIAGNOSIS — Z8542 Personal history of malignant neoplasm of other parts of uterus: Secondary | ICD-10-CM | POA: Insufficient documentation

## 2016-08-12 DIAGNOSIS — E039 Hypothyroidism, unspecified: Secondary | ICD-10-CM | POA: Insufficient documentation

## 2016-08-12 DIAGNOSIS — J449 Chronic obstructive pulmonary disease, unspecified: Secondary | ICD-10-CM | POA: Diagnosis not present

## 2016-08-12 DIAGNOSIS — F17210 Nicotine dependence, cigarettes, uncomplicated: Secondary | ICD-10-CM | POA: Diagnosis not present

## 2016-08-12 DIAGNOSIS — Z79899 Other long term (current) drug therapy: Secondary | ICD-10-CM | POA: Insufficient documentation

## 2016-08-12 DIAGNOSIS — R0789 Other chest pain: Secondary | ICD-10-CM | POA: Diagnosis not present

## 2016-08-12 DIAGNOSIS — F1290 Cannabis use, unspecified, uncomplicated: Secondary | ICD-10-CM | POA: Insufficient documentation

## 2016-08-12 LAB — I-STAT TROPONIN, ED: Troponin i, poc: 0 ng/mL (ref 0.00–0.08)

## 2016-08-12 NOTE — Discharge Instructions (Signed)
Follow up with your family md for recheck.  Take tylenol or motrin for pain

## 2016-08-12 NOTE — ED Notes (Signed)
Trop 0.00,  md notified
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
## 2016-08-16 NOTE — ED Provider Notes (Signed)
[facility] DEPT Provider Note   CSN: [id number] Arrival date & time: 08/12/16  1950     History   Chief Complaint Chief Complaint  Patient presents with  . Medical Clearance    HPI Cynthia Church is a 51 y.o. female.  Pt complains of chest pain    Chest Pain   This is a new problem. The current episode started 6 to 12 hours ago. The problem occurs constantly. The problem has not changed since onset.The pain is associated with raising an arm. The pain is at a severity of 4/10. The pain is moderate. The quality of the pain is described as sharp. The pain does not radiate. Pertinent negatives include no abdominal pain, no back pain, no cough and no headaches.  Pertinent negatives for past medical history include no seizures.    Past Medical History:  Diagnosis Date  . Alcoholism ([place])   . Bipolar affective disorder, manic ([place]) 1993   [facility]x'd at [facility]  . Cancer ([place]) 1989   uterine  . Chronic pain   . COPD (chronic obstructive pulmonary disease) ([place])   . Headache(784.0) 09/25/2011  . Hyperthyroidism 1993  . Low back pain   . Mental disorder   . Personality disorders 1993    Patient Active Problem List   Diagnosis Date Noted  . MDD (major depressive disorder) 07/07/2016  . COPD without exacerbation ([place]) 06/05/2016  . Tobacco abuse 06/05/2016  . Bipolar disorder ([place]) 06/05/2016  . Chronic pain syndrome 06/05/2016  . 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 Abdominal aortic atherosclerosis ([place]) 06/05/2016  . Hypothyroidism 06/05/2016  . Alcohol dependence ([place]) 09/29/2011    Past Surgical History:  Procedure Laterality Date  . BACK SURGERY    . BREAST SURGERY  2003   to check for possible cancer  . cauterization of uterine cancer  2008  . tubal ligaation      OB History    Gravida Para Term Preterm AB Living             0   SAB TAB Ectopic Multiple Live Births                   Home Medications    Prior to Admission medications     Medication Sig Start Date End Date Taking? Authorizing Provider  ibuprofen (ADVIL,MOTRIN) 200 MG tablet Take 200 mg by mouth every 6 (six) hours as needed.   Yes Historical Provider, MD  metoprolol tartrate (LOPRESSOR) 25 MG tablet Take 1 tablet (25 mg total) by mouth 2 (two) times daily. 07/10/16   Kerrie Buffalo, NP  traZODone (DESYREL) 50 MG tablet Take 1 tablet (50 mg total) by mouth at bedtime as needed for sleep. Patient not taking: Reported on 08/12/2016 07/10/16   Kerrie Buffalo, NP    Family History Family History  Problem Relation Age of Onset  . Cirrhosis Father     Social History Social History  Substance Use Topics  . Smoking status: Current Every Day Smoker    Packs/day: 3.00    Years: 28.00    Types: Cigarettes  . Smokeless tobacco: Never Used  . Alcohol use Yes     Comment: daily     Allergies   Patient has no known allergies.   Review of Systems Review of Systems  Constitutional: Negative for appetite change and fatigue.  HENT: Negative for congestion, ear discharge and sinus pressure.   Eyes: Negative for discharge.  Respiratory: Negative for cough.  Cardiovascular: Positive for chest pain.  Gastrointestinal: Negative for abdominal pain and diarrhea.  Genitourinary: Negative for frequency and hematuria.  Musculoskeletal: Negative for back pain.  Skin: Negative for rash.  Neurological: Negative for seizures and headaches.  Psychiatric/Behavioral: Negative for hallucinations.     Physical Exam Updated Vital Signs BP 149/82 (BP Location: Left Arm)   Pulse 93   Temp 97.6 F (36.4 C) (Oral)   Resp 18   Ht 5\' 3"  (1.6 m)   Wt 120 lb (54.4 kg)   SpO2 95%   BMI 21.26 kg/m   Physical Exam  Constitutional: She is oriented to person, place, and time. She appears well-developed.  HENT:  Head: Normocephalic.  Eyes: Conjunctivae and EOM are normal. No scleral icterus.  Neck: Neck supple. No thyromegaly present.  Cardiovascular: Normal rate and  regular rhythm.  Exam reveals no gallop and no friction rub.   No murmur heard. Tender left ant chest  Pulmonary/Chest: No stridor. She has no wheezes. She has no rales. She exhibits no tenderness.  Abdominal: She exhibits no distension. There is no tenderness. There is no rebound.  Musculoskeletal: Normal range of motion. She exhibits no edema.  Lymphadenopathy:    She has no cervical adenopathy.  Neurological: She is oriented to person, place, and time. She exhibits normal muscle tone. Coordination normal.  Skin: No rash noted. No erythema.  Psychiatric: She has a normal mood and affect. Her behavior is normal.     ED Treatments / Results  Labs (all labs ordered are listed, but only abnormal results are displayed) Labs Reviewed  Randolm Idol, ED    EKG  EKG Interpretation  Date/Time:  Sunday August 12 2016 20:54:50 EST Ventricular Rate:  86 PR Interval:    QRS Duration: 83 QT Interval:  391 QTC Calculation: 468 R Axis:   52 Text Interpretation:  Sinus rhythm Baseline wander in lead(s) V2 Confirmed by Fredrick Dray  MD, Darrielle Pflieger [phone]) on 08/12/2016 9:20:29 PM       Radiology No results found.  Procedures Procedures (including critical care time)  Medications Ordered in ED Medications - No data to display   Initial Impression / Assessment and Plan / ED Course  I have reviewed the triage vital signs and the nursing notes.  Pertinent labs & imaging results that were available during my care of the patient were reviewed by me and considered in my medical decision making (see chart for details).  Clinical Course     Chest wall pain.  tx with tylenol or motrin  Final Clinical Impressions(s) / ED Diagnoses   Final diagnoses:  Back pain at L4-L5 level  Chest wall pain    New Prescriptions Discharge Medication List as of 08/12/2016  9:37 PM       Milton Ferguson, MD 08/16/16 1155

## 2016-10-22 ENCOUNTER — Emergency Department (HOSPITAL_COMMUNITY)
Admission: EM | Admit: 2016-10-22 | Discharge: 2016-10-22 | Disposition: A | Discharge status: Home / Self Care | Payer: Medicare Other | Attending: Dermatology | Admitting: Dermatology

## 2016-10-22 DIAGNOSIS — Z5321 Procedure and treatment not carried out due to patient leaving prior to being seen by health care provider: Secondary | ICD-10-CM | POA: Diagnosis not present

## 2016-10-22 DIAGNOSIS — F17210 Nicotine dependence, cigarettes, uncomplicated: Secondary | ICD-10-CM | POA: Diagnosis not present

## 2016-10-22 DIAGNOSIS — R52 Pain, unspecified: Secondary | ICD-10-CM | POA: Insufficient documentation

## 2016-10-22 NOTE — ED Notes (Signed)
Called to triage-- no answer

## 2016-12-23 ENCOUNTER — Emergency Department (HOSPITAL_COMMUNITY)
Admission: EM | Admit: 2016-12-23 | Discharge: 2016-12-23 | Discharge status: Against Medical Advice | Payer: Medicare Other

## 2016-12-23 NOTE — ED Notes (Signed)
Called no answer

## 2016-12-23 NOTE — ED Notes (Signed)
Pt called no answer 

## 2016-12-23 NOTE — ED Notes (Signed)
Pt was called no answer 

## 2017-01-02 ENCOUNTER — Emergency Department (HOSPITAL_COMMUNITY)
Admission: EM | Admit: 2017-01-02 | Discharge: 2017-01-02 | Disposition: A | Discharge status: Home / Self Care | Payer: Medicare Other | Attending: Emergency Medicine | Admitting: Emergency Medicine

## 2017-01-02 ENCOUNTER — Emergency Department (HOSPITAL_COMMUNITY): Payer: Medicare Other

## 2017-01-02 ENCOUNTER — Encounter (HOSPITAL_COMMUNITY): Payer: Self-pay | Admitting: Emergency Medicine

## 2017-01-02 DIAGNOSIS — Z79899 Other long term (current) drug therapy: Secondary | ICD-10-CM | POA: Diagnosis not present

## 2017-01-02 DIAGNOSIS — L509 Urticaria, unspecified: Secondary | ICD-10-CM | POA: Diagnosis not present

## 2017-01-02 DIAGNOSIS — Y929 Unspecified place or not applicable: Secondary | ICD-10-CM | POA: Insufficient documentation

## 2017-01-02 DIAGNOSIS — J189 Pneumonia, unspecified organism: Secondary | ICD-10-CM | POA: Diagnosis not present

## 2017-01-02 DIAGNOSIS — Y999 Unspecified external cause status: Secondary | ICD-10-CM | POA: Insufficient documentation

## 2017-01-02 DIAGNOSIS — Y939 Activity, unspecified: Secondary | ICD-10-CM | POA: Diagnosis not present

## 2017-01-02 DIAGNOSIS — F17210 Nicotine dependence, cigarettes, uncomplicated: Secondary | ICD-10-CM | POA: Insufficient documentation

## 2017-01-02 DIAGNOSIS — E039 Hypothyroidism, unspecified: Secondary | ICD-10-CM | POA: Diagnosis not present

## 2017-01-02 DIAGNOSIS — S2232XA Fracture of one rib, left side, initial encounter for closed fracture: Secondary | ICD-10-CM | POA: Insufficient documentation

## 2017-01-02 DIAGNOSIS — J449 Chronic obstructive pulmonary disease, unspecified: Secondary | ICD-10-CM | POA: Diagnosis not present

## 2017-01-02 DIAGNOSIS — S299XXA Unspecified injury of thorax, initial encounter: Secondary | ICD-10-CM | POA: Diagnosis present

## 2017-01-02 MED ORDER — DIPHENHYDRAMINE HCL 25 MG PO CAPS
50.0000 mg | ORAL_CAPSULE | Freq: Once | ORAL | Status: AC
  Administered 2017-01-02: 50 mg via ORAL
  Filled 2017-01-02: qty 2

## 2017-01-02 MED ORDER — PREDNISONE 10 MG PO TABS: 60.0000 mg | ORAL_TABLET | Freq: Every day | ORAL | 0 refills | Status: DC

## 2017-01-02 MED ORDER — FAMOTIDINE 20 MG PO TABS: 20.0000 mg | ORAL_TABLET | Freq: Two times a day (BID) | ORAL | 0 refills | Status: DC

## 2017-01-02 MED ORDER — IBUPROFEN 400 MG PO TABS
600.0000 mg | ORAL_TABLET | Freq: Once | ORAL | Status: AC
  Administered 2017-01-02: 600 mg via ORAL
  Filled 2017-01-02: qty 2

## 2017-01-02 MED ORDER — PREDNISONE 50 MG PO TABS
60.0000 mg | ORAL_TABLET | Freq: Once | ORAL | Status: AC
Start: 2017-01-02 — End: 2017-01-02
  Administered 2017-01-02: 01:00:00 60 mg via ORAL
  Filled 2017-01-02: qty 1

## 2017-01-02 MED ORDER — FAMOTIDINE 20 MG PO TABS
20.0000 mg | ORAL_TABLET | Freq: Once | ORAL | Status: AC
  Administered 2017-01-02: 20 mg via ORAL
  Filled 2017-01-02: qty 1

## 2017-01-02 MED ORDER — DIPHENHYDRAMINE HCL 25 MG PO TABS: 25.0000 mg | ORAL_TABLET | Freq: Four times a day (QID) | ORAL | 0 refills | Status: DC

## 2017-01-02 MED ORDER — LEVOFLOXACIN 500 MG PO TABS
500.0000 mg | ORAL_TABLET | Freq: Once | ORAL | Status: AC
  Administered 2017-01-02: 500 mg via ORAL
  Filled 2017-01-02: qty 1

## 2017-01-02 MED ORDER — LEVOFLOXACIN 500 MG PO TABS: 500.0000 mg | ORAL_TABLET | Freq: Every day | ORAL | 0 refills | Status: DC

## 2017-01-02 MED ORDER — OXYCODONE-ACETAMINOPHEN 5-325 MG PO TABS
1.0000 | ORAL_TABLET | Freq: Once | ORAL | Status: AC
  Administered 2017-01-02: 1 via ORAL
  Filled 2017-01-02: qty 1
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
## 2017-01-02 NOTE — ED Notes (Signed)
Pt states understanding of care given and follow up instructions.  Pt a/o, ambulated from ED with steady gait  
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
## 2017-01-23 ENCOUNTER — Emergency Department (HOSPITAL_COMMUNITY)
Admission: EM | Admit: 2017-01-23 | Discharge: 2017-01-23 | Disposition: A | Discharge status: Home / Self Care | Payer: Medicare Other | Attending: Emergency Medicine | Admitting: Emergency Medicine

## 2017-01-23 ENCOUNTER — Emergency Department (HOSPITAL_COMMUNITY): Payer: Medicare Other

## 2017-01-23 ENCOUNTER — Encounter (HOSPITAL_COMMUNITY): Payer: Self-pay | Admitting: *Deleted

## 2017-01-23 DIAGNOSIS — F319 Bipolar disorder, unspecified: Secondary | ICD-10-CM | POA: Insufficient documentation

## 2017-01-23 DIAGNOSIS — Y929 Unspecified place or not applicable: Secondary | ICD-10-CM | POA: Diagnosis not present

## 2017-01-23 DIAGNOSIS — E0590 Thyrotoxicosis, unspecified without thyrotoxic crisis or storm: Secondary | ICD-10-CM | POA: Insufficient documentation

## 2017-01-23 DIAGNOSIS — Y939 Activity, unspecified: Secondary | ICD-10-CM | POA: Diagnosis not present

## 2017-01-23 DIAGNOSIS — Z79899 Other long term (current) drug therapy: Secondary | ICD-10-CM | POA: Insufficient documentation

## 2017-01-23 DIAGNOSIS — Y999 Unspecified external cause status: Secondary | ICD-10-CM | POA: Diagnosis not present

## 2017-01-23 DIAGNOSIS — Z8541 Personal history of malignant neoplasm of cervix uteri: Secondary | ICD-10-CM | POA: Insufficient documentation

## 2017-01-23 DIAGNOSIS — S2232XA Fracture of one rib, left side, initial encounter for closed fracture: Secondary | ICD-10-CM | POA: Insufficient documentation

## 2017-01-23 DIAGNOSIS — S20302A Unspecified superficial injuries of left front wall of thorax, initial encounter: Secondary | ICD-10-CM | POA: Diagnosis present

## 2017-01-23 DIAGNOSIS — E039 Hypothyroidism, unspecified: Secondary | ICD-10-CM | POA: Diagnosis not present

## 2017-01-23 DIAGNOSIS — F17210 Nicotine dependence, cigarettes, uncomplicated: Secondary | ICD-10-CM | POA: Insufficient documentation

## 2017-01-23 DIAGNOSIS — J449 Chronic obstructive pulmonary disease, unspecified: Secondary | ICD-10-CM | POA: Diagnosis not present

## 2017-01-23 LAB — CBC WITH DIFFERENTIAL/PLATELET
BASOS ABS: 0 10*3/uL (ref 0.0–0.1)
BASOS PCT: 0 %
Eosinophils Absolute: 0.1 10*3/uL (ref 0.0–0.7)
Eosinophils Relative: 1 %
HEMATOCRIT: 43.8 % (ref 36.0–46.0)
HEMOGLOBIN: 15.4 g/dL — AB (ref 12.0–15.0)
Lymphocytes Relative: 43 %
Lymphs Abs: 2.9 10*3/uL (ref 0.7–4.0)
MCH: 35.4 pg — ABNORMAL HIGH (ref 26.0–34.0)
MCHC: 35.2 g/dL (ref 30.0–36.0)
MCV: 100.7 fL — ABNORMAL HIGH (ref 78.0–100.0)
MONOS PCT: 10 %
Monocytes Absolute: 0.7 10*3/uL (ref 0.1–1.0)
NEUTROS ABS: 3.2 10*3/uL (ref 1.7–7.7)
NEUTROS PCT: 46 %
Platelets: 275 10*3/uL (ref 150–400)
RBC: 4.35 MIL/uL (ref 3.87–5.11)
RDW: 12.7 % (ref 11.5–15.5)
WBC: 6.8 10*3/uL (ref 4.0–10.5)

## 2017-01-23 LAB — COMPREHENSIVE METABOLIC PANEL
ALBUMIN: 4.3 g/dL (ref 3.5–5.0)
ALT: 20 U/L (ref 14–54)
ANION GAP: 14 (ref 5–15)
AST: 34 U/L (ref 15–41)
Alkaline Phosphatase: 53 U/L (ref 38–126)
BILIRUBIN TOTAL: 0.6 mg/dL (ref 0.3–1.2)
BUN: <5 mg/dL — ABNORMAL LOW (ref 6–20)
CO2: 19 mmol/L — ABNORMAL LOW (ref 22–32)
Calcium: 8.6 mg/dL — ABNORMAL LOW (ref 8.9–10.3)
Chloride: 96 mmol/L — ABNORMAL LOW (ref 101–111)
Creatinine, Ser: 0.55 mg/dL (ref 0.44–1.00)
GFR calc non Af Amer: >60 mL/min (ref >60)
GLUCOSE: 98 mg/dL (ref 65–99)
POTASSIUM: 3.4 mmol/L — AB (ref 3.5–5.1)
SODIUM: 129 mmol/L — AB (ref 135–145)
TOTAL PROTEIN: 8.1 g/dL (ref 6.5–8.1)

## 2017-01-23 LAB — ETHANOL: ALCOHOL ETHYL (B): 251 mg/dL — AB (ref <5)

## 2017-01-23 MED ORDER — IBUPROFEN 800 MG PO TABS: 800.0000 mg | ORAL_TABLET | Freq: Three times a day (TID) | ORAL | 0 refills | Status: DC | PRN

## 2017-01-23 MED ORDER — IBUPROFEN 800 MG PO TABS
800.0000 mg | ORAL_TABLET | Freq: Once | ORAL | Status: AC
  Administered 2017-01-23: 800 mg via ORAL
  Filled 2017-01-23: qty 1
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
## 2017-01-23 NOTE — Discharge Instructions (Signed)
Follow up with your md if needed °
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
## 2017-10-08 ENCOUNTER — Encounter (HOSPITAL_COMMUNITY): Payer: Self-pay | Admitting: Emergency Medicine

## 2017-10-08 ENCOUNTER — Emergency Department (HOSPITAL_COMMUNITY): Payer: Medicare Other

## 2017-10-08 ENCOUNTER — Emergency Department (HOSPITAL_COMMUNITY)
Admission: EM | Admit: 2017-10-08 | Discharge: 2017-10-08 | Discharge status: Against Medical Advice | Payer: Medicare Other | Source: Home / Self Care | Attending: Emergency Medicine | Admitting: Emergency Medicine

## 2017-10-08 ENCOUNTER — Other Ambulatory Visit: Payer: Self-pay

## 2017-10-08 ENCOUNTER — Observation Stay (HOSPITAL_COMMUNITY)
Admission: EM | Admit: 2017-10-08 | Discharge: 2017-10-09 | Discharge status: Against Medical Advice | Payer: Medicare Other | Attending: Internal Medicine | Admitting: Internal Medicine

## 2017-10-08 DIAGNOSIS — R9389 Abnormal findings on diagnostic imaging of other specified body structures: Secondary | ICD-10-CM | POA: Insufficient documentation

## 2017-10-08 DIAGNOSIS — H9201 Otalgia, right ear: Secondary | ICD-10-CM | POA: Insufficient documentation

## 2017-10-08 DIAGNOSIS — E876 Hypokalemia: Principal | ICD-10-CM | POA: Diagnosis present

## 2017-10-08 DIAGNOSIS — Z8542 Personal history of malignant neoplasm of other parts of uterus: Secondary | ICD-10-CM | POA: Insufficient documentation

## 2017-10-08 DIAGNOSIS — F319 Bipolar disorder, unspecified: Secondary | ICD-10-CM | POA: Insufficient documentation

## 2017-10-08 DIAGNOSIS — F329 Major depressive disorder, single episode, unspecified: Secondary | ICD-10-CM | POA: Diagnosis present

## 2017-10-08 DIAGNOSIS — Z79899 Other long term (current) drug therapy: Secondary | ICD-10-CM | POA: Insufficient documentation

## 2017-10-08 DIAGNOSIS — D751 Secondary polycythemia: Secondary | ICD-10-CM | POA: Insufficient documentation

## 2017-10-08 DIAGNOSIS — F17210 Nicotine dependence, cigarettes, uncomplicated: Secondary | ICD-10-CM | POA: Insufficient documentation

## 2017-10-08 DIAGNOSIS — C770 Secondary and unspecified malignant neoplasm of lymph nodes of head, face and neck: Secondary | ICD-10-CM | POA: Insufficient documentation

## 2017-10-08 DIAGNOSIS — J029 Acute pharyngitis, unspecified: Secondary | ICD-10-CM

## 2017-10-08 DIAGNOSIS — F1020 Alcohol dependence, uncomplicated: Secondary | ICD-10-CM | POA: Diagnosis not present

## 2017-10-08 DIAGNOSIS — R1310 Dysphagia, unspecified: Secondary | ICD-10-CM | POA: Diagnosis not present

## 2017-10-08 DIAGNOSIS — Z5321 Procedure and treatment not carried out due to patient leaving prior to being seen by health care provider: Secondary | ICD-10-CM | POA: Insufficient documentation

## 2017-10-08 DIAGNOSIS — G894 Chronic pain syndrome: Secondary | ICD-10-CM | POA: Insufficient documentation

## 2017-10-08 DIAGNOSIS — J449 Chronic obstructive pulmonary disease, unspecified: Secondary | ICD-10-CM | POA: Insufficient documentation

## 2017-10-08 DIAGNOSIS — E039 Hypothyroidism, unspecified: Secondary | ICD-10-CM | POA: Diagnosis not present

## 2017-10-08 DIAGNOSIS — L048 Acute lymphadenitis of other sites: Secondary | ICD-10-CM | POA: Diagnosis not present

## 2017-10-08 DIAGNOSIS — I517 Cardiomegaly: Secondary | ICD-10-CM | POA: Insufficient documentation

## 2017-10-08 DIAGNOSIS — Z720 Tobacco use: Secondary | ICD-10-CM | POA: Diagnosis present

## 2017-10-08 DIAGNOSIS — C760 Malignant neoplasm of head, face and neck: Secondary | ICD-10-CM

## 2017-10-08 LAB — CBC WITH DIFFERENTIAL/PLATELET
BASOS ABS: 0 10*3/uL (ref 0.0–0.1)
Basophils Relative: 0 %
Eosinophils Absolute: 0 10*3/uL (ref 0.0–0.7)
Eosinophils Relative: 1 %
HCT: 50 % — ABNORMAL HIGH (ref 36.0–46.0)
HEMOGLOBIN: 16.6 g/dL — AB (ref 12.0–15.0)
LYMPHS ABS: 1.5 10*3/uL (ref 0.7–4.0)
LYMPHS PCT: 19 %
MCH: 31.7 pg (ref 26.0–34.0)
MCHC: 33.2 g/dL (ref 30.0–36.0)
MCV: 95.6 fL (ref 78.0–100.0)
Monocytes Absolute: 0.5 10*3/uL (ref 0.1–1.0)
Monocytes Relative: 6 %
NEUTROS PCT: 74 %
Neutro Abs: 5.8 10*3/uL (ref 1.7–7.7)
PLATELETS: 376 10*3/uL (ref 150–400)
RBC: 5.23 MIL/uL — AB (ref 3.87–5.11)
RDW: 13.3 % (ref 11.5–15.5)
WBC: 7.8 10*3/uL (ref 4.0–10.5)

## 2017-10-08 LAB — BASIC METABOLIC PANEL
ANION GAP: 16 — AB (ref 5–15)
BUN: 17 mg/dL (ref 6–20)
CHLORIDE: 95 mmol/L — AB (ref 101–111)
CO2: 28 mmol/L (ref 22–32)
Calcium: 9.4 mg/dL (ref 8.9–10.3)
Creatinine, Ser: 0.62 mg/dL (ref 0.44–1.00)
GFR calc Af Amer: >60 mL/min (ref >60)
GFR calc non Af Amer: >60 mL/min (ref >60)
Glucose, Bld: 94 mg/dL (ref 65–99)
POTASSIUM: 2.6 mmol/L — AB (ref 3.5–5.1)
SODIUM: 139 mmol/L (ref 135–145)

## 2017-10-08 LAB — RAPID STREP SCREEN (MED CTR MEBANE ONLY): STREPTOCOCCUS, GROUP A SCREEN (DIRECT): NEGATIVE

## 2017-10-08 LAB — TSH: TSH: 2.671 u[IU]/mL (ref 0.350–4.500)

## 2017-10-08 MED ORDER — POTASSIUM CHLORIDE 10 MEQ/100ML IV SOLN
10.0000 meq | INTRAVENOUS | Status: AC
  Administered 2017-10-08 – 2017-10-09 (×4): 10 meq via INTRAVENOUS
  Filled 2017-10-08 (×4): qty 100

## 2017-10-08 MED ORDER — POTASSIUM CHLORIDE IN NACL 20-0.9 MEQ/L-% IV SOLN
INTRAVENOUS | Status: DC
  Administered 2017-10-09: 01:00:00 via INTRAVENOUS
  Filled 2017-10-08: qty 1000

## 2017-10-08 MED ORDER — ACETAMINOPHEN 650 MG RE SUPP: 650.0000 mg | Freq: Four times a day (QID) | RECTAL | Status: DC | PRN

## 2017-10-08 MED ORDER — POTASSIUM CHLORIDE 10 MEQ/100ML IV SOLN
10.0000 meq | INTRAVENOUS | Status: DC
  Filled 2017-10-08: qty 100

## 2017-10-08 MED ORDER — ONDANSETRON HCL 4 MG/2ML IJ SOLN: 4.0000 mg | Freq: Four times a day (QID) | INTRAMUSCULAR | Status: DC | PRN

## 2017-10-08 MED ORDER — ONDANSETRON HCL 4 MG PO TABS: 4.0000 mg | ORAL_TABLET | Freq: Four times a day (QID) | ORAL | Status: DC | PRN

## 2017-10-08 MED ORDER — ACETAMINOPHEN 325 MG PO TABS: 650.0000 mg | ORAL_TABLET | Freq: Four times a day (QID) | ORAL | Status: DC | PRN

## 2017-10-08 MED ORDER — KETOROLAC TROMETHAMINE 30 MG/ML IJ SOLN
30.0000 mg | Freq: Once | INTRAMUSCULAR | Status: AC
  Administered 2017-10-08: 30 mg via INTRAVENOUS
  Filled 2017-10-08: qty 1

## 2017-10-08 MED ORDER — SODIUM CHLORIDE 0.9 % IV BOLUS (SEPSIS)
1000.0000 mL | Freq: Once | INTRAVENOUS | Status: AC
  Administered 2017-10-08: 1000 mL via INTRAVENOUS

## 2017-10-08 MED ORDER — ENOXAPARIN SODIUM 40 MG/0.4ML ~~LOC~~ SOLN
40.0000 mg | SUBCUTANEOUS | Status: DC
  Administered 2017-10-09: 40 mg via SUBCUTANEOUS
  Filled 2017-10-08: qty 0.4

## 2017-10-08 MED ORDER — DEXAMETHASONE SODIUM PHOSPHATE 10 MG/ML IJ SOLN
10.0000 mg | Freq: Four times a day (QID) | INTRAMUSCULAR | Status: DC
  Administered 2017-10-08: 10 mg via INTRAVENOUS
  Filled 2017-10-08: qty 1

## 2017-10-08 MED ORDER — POTASSIUM CHLORIDE CRYS ER 20 MEQ PO TBCR
40.0000 meq | EXTENDED_RELEASE_TABLET | Freq: Once | ORAL | Status: DC
  Filled 2017-10-08: qty 2

## 2017-10-08 MED ORDER — IOPAMIDOL (ISOVUE-300) INJECTION 61%
75.0000 mL | Freq: Once | INTRAVENOUS | Status: AC | PRN
  Administered 2017-10-08: 75 mL via INTRAVENOUS

## 2017-10-08 NOTE — ED Provider Notes (Signed)
Emergency Department Provider Note   I have reviewed the triage vital signs and the nursing notes.   HISTORY  Chief Complaint Abnormal Lab   HPI Cynthia Church is a 52 y.o. female with PMH of Alcoholism, Bipolar Disorder, Hyperthyroidism, COPD, and chronic pain returns to the ED for evaluation of sore throat and electrolyte abnormality.  Returns to the ED for evaluation of chronic sore throat, difficulty swallowing, and ear pain. Symptoms have been progressively worsening over the last 4-6 months. She was a prior heavy EtOH drinker but has stopped since pain started. She does endorse smoking crack. Denies any CP or SOB. She has not been able to eat/drink much over the last several weeks. Denies any heart palpitations. No abdominal pain, vomiting, or diarrhea. She notes unintentional weight loss over this time period.    Past Medical History:  Diagnosis Date  . Alcoholism ([place])   . Bipolar affective disorder, manic ([place]) 1993   [facility]'d [facility]  . Cancer ([place]) 1989   uterine  . Chronic pain   . COPD (chronic obstructive pulmonary disease) ([place])   . Headache(784.0) 09/25/2011  . Hyperthyroidism 1993  . Low back pain   . Mental disorder   . Personality disorders 1993    Patient Active Problem List   Diagnosis Date Noted  . Abnormal CT scan, neck 10/09/2017  . Hypokalemia 10/08/2017  . Polycythemia 10/08/2017  . MDD (major depressive disorder) 07/07/2016  . COPD without exacerbation ([place]) 06/05/2016  . Tobacco abuse 06/05/2016  . Bipolar disorder ([place]) 06/05/2016  . Chronic pain syndrome 06/05/2016  . 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 Abdominal aortic atherosclerosis ([place]) 06/05/2016  . Hypothyroidism 06/05/2016  . Alcohol dependence ([place]) 09/29/2011    Past Surgical History:  Procedure Laterality Date  . BACK SURGERY    . BREAST SURGERY  2003   to check for possible cancer  . cauterization of uterine cancer  2008  . tubal ligaation       Current Outpatient Rx  . Order #: [id number] Class: Print  . Order #: [id number] Class: Print  . Order #: [id number] Class: Print  . Order #: [id number] Class: Print  . Order #: [id number] Class: Print  . Order #: [id number] Class: Print  . Order #: [id number] Class: Print    Allergies Patient has no known allergies.  Family History  Problem Relation Age of Onset  . Cirrhosis Father     Social History Social History   Tobacco Use  . Smoking status: Current Every Day Smoker    Packs/day: 3.00    Years: 28.00    Pack years: 84.00    Types: Cigarettes  . Smokeless tobacco: Never Used  Substance Use Topics  . Alcohol use: Yes    Comment: daily  . Drug use: Yes    Types: Marijuana, Cocaine    Comment: yesterday    Review of Systems  Constitutional: No fever/chills. Positive fatigue and unintentional weight loss.  Eyes: No visual changes. ENT: Positive sore throat and difficulty swallowing. Positive right ear pain.  Cardiovascular: Denies chest pain. Respiratory: Denies shortness of breath. Gastrointestinal: No abdominal pain.  No nausea, no vomiting.  No diarrhea.  No constipation. Genitourinary: Negative for dysuria. Musculoskeletal: Negative for back pain. Skin: Negative for rash. Neurological: Negative for headaches, focal weakness or numbness.  10-point ROS otherwise negative.  ____________________________________________   PHYSICAL EXAM:  VITAL SIGNS: ED Triage Vitals [10/08/17 2053]  Enc Vitals Group     BP (!) 119/93  Pulse Rate 90     Resp 19     Temp 98.1 F (36.7 C)     Temp Source Oral     SpO2 100 %     Pain Score 10   Constitutional: Alert and oriented. Very thin and chronically ill-appearing. No acute distress. Breathing comfortably.  Eyes: Conjunctivae are normal.  Head: Atraumatic. Nose: No congestion/rhinnorhea. Mouth/Throat: Mucous membranes are very dry. Erythematous oropharynx. No PTA or tonsillar exudate. Managing secretions. Speaking  in a hoarse voice.  Neck: No stridor.  No meningeal signs.  Cardiovascular: Normal rate, regular rhythm. Good peripheral circulation. Grossly normal heart sounds.   Respiratory: Normal respiratory effort.  No retractions. Lungs CTAB. Gastrointestinal: Soft and nontender. No distention.  Musculoskeletal: No lower extremity tenderness nor edema. No gross deformities of extremities. Neurologic:  Normal speech and language. No gross focal neurologic deficits are appreciated.  Skin:  Skin is warm, dry and intact. No rash noted.  ____________________________________________   LABS (all labs ordered are listed, but only abnormal results are displayed)  Labs Reviewed  BASIC METABOLIC PANEL - Abnormal; Notable for the following components:      Result Value   Potassium 3.0 (*)    Chloride 96 (*)    Glucose, Bld 110 (*)    BUN 23 (*)    Anion gap 17 (*)    All other components within normal limits  BASIC METABOLIC PANEL - Abnormal; Notable for the following components:   CO2 20 (*)    Calcium 8.1 (*)    All other components within normal limits  RAPID STREP SCREEN (NOT AT [facility])  CULTURE, GROUP A STREP ([facility])  TSH  MAGNESIUM  PHOSPHORUS  CBC WITH DIFFERENTIAL/PLATELET  HIV ANTIBODY (ROUTINE TESTING)   ____________________________________________  EKG   EKG Interpretation  Date/Time:  Tuesday October 08 2017 22:35:51 EST Ventricular Rate:  79 PR Interval:    QRS Duration: 108 QT Interval:  432 QTC Calculation: 496 R Axis:   68 Text Interpretation:  Sinus rhythm Biatrial enlargement Borderline prolonged QT interval No STEMI.  Confirmed by Nanda Quinton [phone]) on 10/08/2017 11:07:33 PM       ____________________________________________  RADIOLOGY  Dg Neck Soft Tissue  Result Date: 10/08/2017 CLINICAL DATA:  52 y/o  F; 6 months of choking sensation. EXAM: NECK SOFT TISSUES - 1+ VIEW COMPARISON:  03/05/2016 CT cervical spine FINDINGS: Airway effacement in vallecula and  hypopharynx may represent debris, mucosal lesion, or mucosal thickening. Moderate cervical spondylosis with large anterior marginal osteophytes at the C5-6 levels. No radiopaque foreign body identified. IMPRESSION: Airway effacement in vallecula and hypopharynx may represent debris, mucosal lesion, or mucosal thickening. Direct visualization recommended. Electronically Signed   By: Kristine Garbe M.D.   On: 10/08/2017 23:26   Ct Soft Tissue Neck W Contrast  Result Date: 10/09/2017 CLINICAL DATA:  52 y/o F; 6 months of sore throat. Abnormal radiographs. EXAM: CT NECK WITH CONTRAST TECHNIQUE: Multidetector CT imaging of the neck was performed using the standard protocol following the bolus administration of intravenous contrast. CONTRAST:  19mL ISOVUE-300 IOPAMIDOL (ISOVUE-300) INJECTION 61% COMPARISON:  03/05/2016 CT cervical spine. 10/08/2017 cervical spine radiographs. FINDINGS: Pharynx and larynx: Diffuse irregular an infiltrative masslike enhancement involving nasopharynx, oropharynx, base of tongue, epiglottis, hypopharynx, supraglottic space, and glottis. In the nasopharynx there is mucosal enhancement along the right lateral and posterior walls of the nasopharynx including fossa of Rosenmuller and there is a 10 mm necrotic right node of Rouviere and right retropharyngeal lymph node at  C1 level (series 2, image 22, 28). In the oropharynx mucosal enhancement involves the posterior pharyngeal wall crossing the midline, right lateral pharyngeal wall, retromolar trigone connecting right soft palate to base of tongue via the pterygomandibular raphe, bilateral base of tongue with up to 17 mm invasion on the right (series 2, image 42), and to lesser degree the left lateral oropharyngeal wall. Ill-defined enhancement effacing right prevertebral space, parapharyngeal space, carotid space likely representing local soft tissue invasion from approximately C2-C5 levels. In the hypopharynx there is irregular  mucosal thickening of the epiglottis, and bulky masslike enhancement of the mucosal surfaces circumferentially with ulcerations resulting in severe airway effacement. Laryngeal involvement involves true and false cords, anterior commissure, and paraglottic fat. No definite thyroid cartilage invasion or extra laryngeal extension. Salivary glands: No definite invasion. Thyroid: Normal. Lymph nodes: Necrotic 10 mm right level 3 and 14 x 13 mm necrotic right level 2 B lymphadenopathy (series 2, image 41 and 52). Vascular: Negative. Limited intracranial: Negative. Visualized orbits: Negative. Mastoids and visualized paranasal sinuses: Clear. Skeleton: No acute or aggressive process. Upper chest: Several 2-3 mm nodules within the upper lobes bilaterally. Other: None. 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 Severe airway effacement at level of hypopharynx. Probable infiltrative invasion of right parapharyngeal, prevertebral, and carotid spaces at approximately C2-C5 levels. Necrotic right retropharyngeal, level 2B, and level 3 metastatic lymphadenopathy. 2-3 mm pulmonary nodules in upper lobes bilaterally. These results were called by telephone at the time of interpretation on 10/09/2017 at 12:37 am to Dr. Nanda Quinton , who verbally acknowledged these results. Electronically Signed   By: Kristine Garbe M.D.   On: 10/09/2017 00:43    ____________________________________________   PROCEDURES  Procedure(s) performed:   .Critical Care Performed by: Margette Fast, MD Authorized by: Margette Fast, MD   Critical care provider statement:    Critical care time (minutes):  35   Critical care was necessary to treat or prevent imminent or life-threatening deterioration of the following conditions:  Metabolic  crisis and respiratory failure   Critical care was time spent personally by me on the following activities:  Blood draw for specimens, development of treatment plan with patient or surrogate, discussions with consultants, evaluation of patient's response to treatment, examination of patient, obtaining history from patient or surrogate, ordering and performing treatments and interventions, ordering and review of laboratory studies, ordering and review of radiographic studies, pulse oximetry, re-evaluation of patient's condition and review of old charts   I assumed direction of critical care for this patient from another provider in my specialty: no    ____________________________________________   INITIAL IMPRESSION / [place] / ED COURSE  Pertinent labs & imaging results that were available during my care of the patient were reviewed by me and considered in my medical decision making (see chart for details).  Patient presents to the ED for evaluation of sore throat, difficulty swallowing, ear ache, and decreased PO intake. Patient potassium from earlier ED visit today with 2.6. Patient refusing PO meds in the ED 2/2 pain. Strep negative. Adding TSH with known hyperthyroidism and sending plain film of the neck. No respiratory distress. Patient is managing her airway. EKG ordered and reviewed which shows prolonged QTc. Plan for IV potassium and reassess after plain film.   Imaging reviewed. Will add CT neck for better visualization. No indication clinically for emergent direct  visualization of the airway with 6 months of symptoms. TSH and strep reviewed and normal. Will admit for hypokalemia.   Discussed patient's case with Hospitalist, Dr. Olevia Bowens to request admission. Patient and family (if present) updated with plan. Care transferred to [facility] service.  I reviewed all nursing notes, vitals, pertinent old records, EKGs, labs, imaging (as available).  Called by radiology with very  concerning CT read noted above. I made Dr. Olevia Bowens aware who will discuss with patient and arrange for transfer for ENT evaluation.  ____________________________________________  FINAL CLINICAL IMPRESSION(S) / ED DIAGNOSES  Final diagnoses:  Hypokalemia  Sore throat  Cancer of neck ([place])    MEDICATIONS GIVEN DURING THIS VISIT:  Medications  sodium chloride 0.9 % bolus 1,000 mL (0 mLs Intravenous Stopped 10/08/17 2351)  potassium chloride 10 mEq in 100 mL IVPB (0 mEq Intravenous Stopped 10/09/17 0630)  iopamidol (ISOVUE-300) 61 % injection 75 mL (75 mLs Intravenous Contrast Given 10/08/17 2359)    Note:  This document was prepared using Dragon voice recognition software and may include unintentional dictation errors.  Nanda Quinton, MD Emergency Medicine    Shawnette Augello, [facility], MD 10/09/17 [phone]

## 2017-10-08 NOTE — ED Notes (Signed)
In room to hang IV potassium.  Pt at door with cigarettes in hand.  Pt states she is going to leave.  Advised pt that her potassium was critically low and I needed to start IV potassium and put her on the monitor.  Pt hollaring that she is not staying.  Pt attempting to walk out.  Removed IV.  PT hollaring at visitor saying I'M leaving.  Security called due to pt hollaring.

## 2017-10-08 NOTE — ED Triage Notes (Addendum)
Pt c/o right ear pain and states she can't swallow. Pt is spitting in cup. Pt states she is out of thyroid meds. Pt states she has been smoking a lot of crack.

## 2017-10-08 NOTE — ED Notes (Signed)
Date and time results received: 10/08/17 0847 (use smartphrase ".now" to insert current time)  Test: Potassium Critical Value: 2.6  Name of Provider Notified: Dr. Lita Mains  Orders Received? Or Actions Taken?: See orders

## 2017-10-08 NOTE — ED Triage Notes (Signed)
Pt states she was here earlier today but walked out.  States she was told her k+ was dangerously low and she had strep throat

## 2017-10-08 NOTE — ED Notes (Signed)
Explained to pt that her potasium was critically low and that her heart could stop beating .  Pt states she doesn't care and she will come back later on.  Pt left ambulatory .

## 2017-10-08 NOTE — ED Provider Notes (Signed)
[facility] EMERGENCY DEPARTMENT Provider Note   CSN: [id number] Arrival date & time: 10/08/17  9622     History   Chief Complaint Chief Complaint  Patient presents with  . Otalgia    HPI Cynthia Church is a 52 y.o. female.  HPI Patient is a poor historian.  Complains of several months of right ear pain.  Denies any known trauma.  She also states for the past 6 months she has had swelling in her throat and difficulty swallowing.  Thinks that her thyroid gland is swollen.  She has had subjective fevers and chills for unknown period of time.  Admits to smoking crack frequently. Past Medical History:  Diagnosis Date  . Alcoholism ([place])   . Bipolar affective disorder, manic ([place]) 1993   [facility]'d at [facility]  . Cancer ([place]) 1989   uterine  . Chronic pain   . COPD (chronic obstructive pulmonary disease) ([place])   . Headache(784.0) 09/25/2011  . Hyperthyroidism 1993  . Low back pain   . Mental disorder   . Personality disorders 1993    Patient Active Problem List   Diagnosis Date Noted  . MDD (major depressive disorder) 07/07/2016  . COPD without exacerbation ([place]) 06/05/2016  . Tobacco abuse 06/05/2016  . Bipolar disorder ([place]) 06/05/2016  . Chronic pain syndrome 06/05/2016  . 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 Abdominal aortic atherosclerosis ([place]) 06/05/2016  . Hypothyroidism 06/05/2016  . Alcohol dependence ([place]) 09/29/2011    Past Surgical History:  Procedure Laterality Date  . BACK SURGERY    . BREAST SURGERY  2003   to check for possible cancer  . cauterization of uterine cancer  2008  . tubal ligaation      OB History    Gravida Para Term Preterm AB Living             0   SAB TAB Ectopic Multiple Live Births                   Home Medications    Prior to Admission medications   Medication Sig Start Date End Date Taking? Authorizing Provider  diphenhydrAMINE (BENADRYL) 25 MG tablet Take 1 tablet (25 mg total) by mouth every 6 (six)  hours. 01/02/17   Jola Schmidt, MD  famotidine (PEPCID) 20 MG tablet Take 1 tablet (20 mg total) by mouth 2 (two) times daily. 01/02/17   Jola Schmidt, MD  ibuprofen (ADVIL,MOTRIN) 800 MG tablet Take 1 tablet (800 mg total) by mouth every 8 (eight) hours as needed for moderate pain. 01/23/17   Milton Ferguson, MD  levofloxacin (LEVAQUIN) 500 MG tablet Take 1 tablet (500 mg total) by mouth daily. 01/02/17   Jola Schmidt, MD  metoprolol tartrate (LOPRESSOR) 25 MG tablet Take 1 tablet (25 mg total) by mouth 2 (two) times daily. 07/10/16   Kerrie Buffalo, NP  predniSONE (DELTASONE) 10 MG tablet Take 6 tablets (60 mg total) by mouth daily. 01/02/17   Jola Schmidt, MD  traZODone (DESYREL) 50 MG tablet Take 1 tablet (50 mg total) by mouth at bedtime as needed for sleep. Patient not taking: Reported on 08/12/2016 07/10/16   Kerrie Buffalo, NP    Family History Family History  Problem Relation Age of Onset  . Cirrhosis Father     Social History Social History   Tobacco Use  . Smoking status: Current Every Day Smoker    Packs/day: 3.00    Years: 28.00    Pack years: 84.00  Types: Cigarettes  . Smokeless tobacco: Never Used  Substance Use Topics  . Alcohol use: Yes    Comment: daily  . Drug use: Yes    Types: Marijuana, Cocaine    Comment: yesterday     Allergies   Patient has no known allergies.   Review of Systems Review of Systems  Constitutional: Positive for chills and fever.  HENT: Positive for ear pain, sore throat and trouble swallowing. Negative for congestion, hearing loss and voice change.   Eyes: Negative for visual disturbance.  Respiratory: Negative for cough and shortness of breath.   Cardiovascular: Negative for chest pain, palpitations and leg swelling.  Gastrointestinal: Negative for abdominal pain, diarrhea, nausea and vomiting.  Genitourinary: Negative for dysuria, flank pain and frequency.  Musculoskeletal: Positive for neck pain. Negative for back pain,  myalgias and neck stiffness.  Skin: Negative for rash and wound.  Neurological: Negative for dizziness, weakness, light-headedness, numbness and headaches.  All other systems reviewed and are negative.    Physical Exam Updated Vital Signs BP (!) 172/114 (BP Location: Right Arm)   Pulse 95   Temp 97.7 F (36.5 C) (Oral)   Resp 20   Wt 56.7 kg (125 lb)   SpO2 100%   BMI 21.46 kg/m   Physical Exam  Constitutional: She is oriented to person, place, and time. She appears well-developed and well-nourished. No distress.  HENT:  Head: Normocephalic and atraumatic.  Mouth/Throat: Oropharynx is clear and moist.  Bilateral TMs grossly normal.  Patient does have some mild tenderness with manipulation of the right external ear.  There is no obvious erythema or swelling.  No discharge.  No mastoid tenderness.  Patient has tonsillar exudate especially in the right tonsil.  Uvula appears to be midline.  Patient is spitting up her secretions.   Eyes: EOM are normal. Pupils are equal, round, and reactive to light.  Neck: Normal range of motion. Neck supple.  No meningismus.  Patient does have right greater than left anterior cervical lymphadenopathy.  No stridor.  Cardiovascular: Normal rate and regular rhythm.  Pulmonary/Chest: Effort normal and breath sounds normal. No stridor. No respiratory distress. She has no wheezes. She has no rales. She exhibits no tenderness.  Abdominal: Soft. Bowel sounds are normal. There is no tenderness. There is no rebound and no guarding.  Musculoskeletal: Normal range of motion. She exhibits no edema or tenderness.  Lymphadenopathy:    She has cervical adenopathy.  Neurological: She is alert and oriented to person, place, and time.  Moving all extremities without deficit.  Sensation intact.  Ambulating without difficulty.  Skin: Skin is warm and dry. No rash noted. No erythema.  Psychiatric: She has a normal mood and affect. Her behavior is normal.  Nursing note  and vitals reviewed.    ED Treatments / Results  Labs (all labs ordered are listed, but only abnormal results are displayed) Labs Reviewed  CBC WITH DIFFERENTIAL/PLATELET - Abnormal; Notable for the following components:      Result Value   RBC 5.23 (*)    Hemoglobin 16.6 (*)    HCT 50.0 (*)    All other components within normal limits  BASIC METABOLIC PANEL - Abnormal; Notable for the following components:   Potassium 2.6 (*)    Chloride 95 (*)    Anion gap 16 (*)    All other components within normal limits    EKG  EKG Interpretation None       Radiology No results found.  Procedures  Procedures (including critical care time)  Medications Ordered in ED Medications  ketorolac (TORADOL) 30 MG/ML injection 30 mg (30 mg Intravenous Given 10/08/17 0752)     Initial Impression / Assessment and Plan / ED Course  I have reviewed the triage vital signs and the nursing notes.  Pertinent labs & imaging results that were available during my care of the patient were reviewed by me and considered in my medical decision making (see chart for details).     Patient left prior to complete evaluation and treatment.  Final Clinical Impressions(s) / ED Diagnoses   Final diagnoses:  Right ear pain  Hypokalemia  Pharyngitis, unspecified etiology    ED Discharge Orders    None       Julianne Rice, MD 10/08/17 1540

## 2017-10-08 NOTE — H&P (Signed)
History and Physical    Cynthia Church:[id number] DOB: Dec 29, 1965 DOA: 10/08/2017  PCP: Patient, No Pcp Per   Patient coming from: Home.   I have personally briefly reviewed patient's old medical records in [place]  Chief Complaint: Low potassium.  HPI: Cynthia Church is a 52 y.o. female with medical history significant of alcoholism, bipolar affective disorder, uterine cancer, chronic pain syndrome, COPD, headache, hypothyroidism, lower back pain who was seen earlier today due to sore throat and otalgia, found to have hypokalemia.  She left before she could be fully evaluated then returned in the evening to be reevaluated and for potassium replacement.  She mentions that she has been having sore throat for several days to the point that she is unable to swallow.  She has been having dysphagia for about 6 months now and has been eating mostly mashed potatoes and pudding.  However, she mentions that in the last few days, swallowing has become more difficult and painful.  She has also decreased appetite, weight loss of a significant amount of pounds, but not sure how much, worsening fatigue and has recently stopped drinking alcohol due to dysphagia/odynophagia.  Denies fever, night sweats, wheezing, hemoptysis, productive cough, chest pain, palpitations, dizziness, diaphoresis, abdominal pain, nausea, vomiting, diarrhea, dysuria, frequency or hematuria.  No heat or cold intolerance.  Denies pruritus or skin rashes.  ED Course: Initial vital signs temperature 36.30F (98.1 F, pulse 90, respirations 19, blood pressure 119/93 and O2 sat 100%.  Earlier her white count was 7.8, hemoglobin 16.6 g/dL and platelets 376.  Potassium was 2.6 and chloride 95 millimol/L.  All other BMP values are within normal limits.  Repeat BMP after potassium replacement shows sodium 137, potassium 3.0, chloride 96, CO2 24 mmol/L.  Glucose 110, BUN 23, creatinine 0.78, calcium 9.3, phosphorus 3.9 and magnesium 2.0  mg/dL.  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 Severe airway effacement at level of hypopharynx. Probable infiltrative invasion of right parapharyngeal, prevertebral, and carotid spaces at approximatelyC2-C5 levels.  Necrotic right retropharyngeal, level 2B, and level 3 metastatic lymphadenopathy.  2-3 mm pulmonary nodules in upper lobes bilaterally.  Review of Systems: As per HPI otherwise 10 point review of systems negative.    Past Medical History:  Diagnosis Date  . Alcoholism ([place])   . Bipolar affective disorder, manic ([place]) 1993   [facility]'d [facility]  . Cancer ([place]) 1989   uterine  . Chronic pain   . COPD (chronic obstructive pulmonary disease) ([place])   . Headache(784.0) 09/25/2011  . Hyperthyroidism 1993  . Low back pain   . Mental disorder   . Personality disorders 1993    Past Surgical History:  Procedure Laterality Date  . BACK SURGERY    . BREAST SURGERY  2003   to check for possible cancer  . cauterization of uterine cancer  2008  . tubal ligaation       reports that she has been smoking cigarettes.  She has a 84.00 pack-year smoking history. she has never used smokeless tobacco. She reports that she drinks alcohol. She reports that she uses drugs. Drugs: Marijuana and Cocaine.  No Known Allergies  Family History  Problem Relation Age of Onset  . Cirrhosis Father     Prior to Admission medications   Medication Sig Start Date End Date Taking?  Authorizing Provider  diphenhydrAMINE (BENADRYL) 25 MG tablet Take 1 tablet (25 mg total) by mouth every 6 (six) hours. 01/02/17   Jola Schmidt, MD  famotidine (PEPCID) 20 MG tablet Take 1 tablet (20 mg total) by mouth 2 (two) times daily. 01/02/17   Jola Schmidt, MD  ibuprofen  (ADVIL,MOTRIN) 800 MG tablet Take 1 tablet (800 mg total) by mouth every 8 (eight) hours as needed for moderate pain. 01/23/17   Milton Ferguson, MD  levofloxacin (LEVAQUIN) 500 MG tablet Take 1 tablet (500 mg total) by mouth daily. 01/02/17   Jola Schmidt, MD  metoprolol tartrate (LOPRESSOR) 25 MG tablet Take 1 tablet (25 mg total) by mouth 2 (two) times daily. 07/10/16   Kerrie Buffalo, NP  predniSONE (DELTASONE) 10 MG tablet Take 6 tablets (60 mg total) by mouth daily. 01/02/17   Jola Schmidt, MD  traZODone (DESYREL) 50 MG tablet Take 1 tablet (50 mg total) by mouth at bedtime as needed for sleep. Patient not taking: Reported on 08/12/2016 07/10/16   Kerrie Buffalo, NP    Physical Exam: Vitals:   10/08/17 2200 10/08/17 2215 10/08/17 2230 10/08/17 2300  BP: (!) 127/92  (!) 130/93 (!) 143/91  Pulse:  80 77 77  Resp: 11 15 15 11   Temp:      TempSrc:      SpO2:  99% 99% 100%    Constitutional: NAD, looks significantly older than chronological age. Eyes: PERRL, lids and conjunctivae normal ENMT: Mucous membranes are dry.  Posterior pharynx erythema. Neck: normal, supple, no masses, no thyromegaly Respiratory: clear to auscultation bilaterally, no wheezing, no crackles. Normal respiratory effort. No accessory muscle use.  Cardiovascular: Regular rate and rhythm, no murmurs / rubs / gallops. No extremity edema. 2+ pedal pulses. No carotid bruits.  Abdomen: Soft, no tenderness, no masses palpated. No hepatosplenomegaly. Bowel sounds positive.  Musculoskeletal: no clubbing / cyanosis.  Good ROM, no contractures. Normal muscle tone.  Skin: Skin is warm, dry and intact.  No clinically significant rashes or lesions seen on very limited dermatological exam. Neurologic: CN 2-12 grossly intact. Sensation intact, DTR normal. Strength 5/5 in all 4.  Psychiatric: Normal judgment and insight. Alert and oriented x 3.    Labs on Admission: I have personally reviewed following labs and imaging  studies  CBC: Recent Labs  Lab 10/08/17 0806  WBC 7.8  NEUTROABS 5.8  HGB 16.6*  HCT 50.0*  MCV 95.6  PLT 242   Basic Metabolic Panel: Recent Labs  Lab 10/08/17 0806  NA 139  K 2.6*  CL 95*  CO2 28  GLUCOSE 94  BUN 17  CREATININE 0.62  CALCIUM 9.4   GFR: CrCl cannot be calculated (Unknown ideal weight.). Liver Function Tests: No results for input(s): AST, ALT, ALKPHOS, BILITOT, PROT, ALBUMIN in the last 168 hours. No results for input(s): LIPASE, AMYLASE in the last 168 hours. No results for input(s): AMMONIA in the last 168 hours. Coagulation Profile: No results for input(s): INR, PROTIME in the last 168 hours. Cardiac Enzymes: No results for input(s): CKTOTAL, CKMB, CKMBINDEX, TROPONINI in the last 168 hours. BNP (last 3 results) No results for input(s): PROBNP in the last 8760 hours. HbA1C: No results for input(s): HGBA1C in the last 72 hours. CBG: No results for input(s): GLUCAP in the last 168 hours. Lipid Profile: No results for input(s): CHOL, HDL, LDLCALC, TRIG, CHOLHDL, LDLDIRECT in the last 72 hours. Thyroid Function Tests: Recent Labs    10/08/17 2225  TSH 2.671   Anemia  Panel: No results for input(s): VITAMINB12, FOLATE, FERRITIN, TIBC, IRON, RETICCTPCT in the last 72 hours. Urine analysis:    Component Value Date/Time   COLORURINE YELLOW 12/03/2015 1423   APPEARANCEUR CLEAR 12/03/2015 1423   LABSPEC <1.005 (L) 12/03/2015 1423   PHURINE 5.5 12/03/2015 1423   GLUCOSEU NEGATIVE 12/03/2015 1423   HGBUR NEGATIVE 12/03/2015 1423   BILIRUBINUR NEGATIVE 12/03/2015 1423   KETONESUR NEGATIVE 12/03/2015 1423   PROTEINUR NEGATIVE 12/03/2015 1423   UROBILINOGEN 0.2 06/01/2015 1329   NITRITE NEGATIVE 12/03/2015 1423   LEUKOCYTESUR NEGATIVE 12/03/2015 1423    Radiological Exams on Admission: Dg Neck Soft Tissue  Result Date: 10/08/2017 CLINICAL DATA:  52 y/o  F; 6 months of choking sensation. EXAM: NECK SOFT TISSUES - 1+ VIEW COMPARISON:   03/05/2016 CT cervical spine FINDINGS: Airway effacement in vallecula and hypopharynx may represent debris, mucosal lesion, or mucosal thickening. Moderate cervical spondylosis with large anterior marginal osteophytes at the C5-6 levels. No radiopaque foreign body identified. IMPRESSION: Airway effacement in vallecula and hypopharynx may represent debris, mucosal lesion, or mucosal thickening. Direct visualization recommended. Electronically Signed   By: Kristine Garbe M.D.   On: 10/08/2017 23:26    EKG: Independently reviewed. Vent. rate 79 BPM PR interval * ms QRS duration 108 ms QT/QTc 432/496 ms P-R-T axes 70 68 -3 Sinus rhythm Biatrial enlargement Borderline prolonged QT interval  Assessment/Plan Principal Problem:   Hypokalemia Replacing. Continue telemetry monitoring. Follow-up potassium level.  Active Problems:   Abnormal CT scan, neck The patient and declined transfer to [facility] for ENT evaluation and mass biopsy. First she wanted to go by herself to [facility]  I explained to her that I would like to set up a formal transfer for her.  She is stated that she would like to get transfer, but be transported by her POA.  I stated that I prefer for her to go in one of our [place], since her airway can get further compromised.  She declined this and wanted me to set up an appointment at the ENTs office, which I am unable to do at this time of the night.  She is upset with this and stated to the staff that she will sign AMA after her potassium supplementation is done.    Alcohol dependence ([place]) Stated that she has not had a drink and a week or 2 due to dysphagia.    COPD without exacerbation ([place]) No signs of exacerbation at this time. Supplemental oxygen and bronchodilators as needed.    Tobacco abuse Declined nicotine replacement therapy.    Hypothyroidism TSH is normal. She stated that she is currently not taking medications.    Bipolar disorder ([place])   MDD  (major depressive disorder) Denies suicidal or homicidal thoughts. Stated that she is currently not taking her trazodone or any other medications.    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   DVT prophylaxis: SCDs. Code Status: Full code. Family Communication:  Disposition Plan: Continue potassium supplementation.  Arrange for transfer for ENT evaluation if the patient changes her mind later. Consults called:  Admission status: Observation/telemetry.   Reubin Milan MD [facility] Pager [phone].  If 7PM-7AM, please contact night-coverage [email or web address] Password [facility]  10/08/2017, 11:53 PM

## 2017-10-08 NOTE — ED Notes (Signed)
Pt & pt's family member given coffee per pt request, approval by EDP

## 2017-10-09 ENCOUNTER — Encounter (HOSPITAL_COMMUNITY): Payer: Self-pay | Admitting: Internal Medicine

## 2017-10-09 DIAGNOSIS — E876 Hypokalemia: Secondary | ICD-10-CM | POA: Diagnosis not present

## 2017-10-09 DIAGNOSIS — R9389 Abnormal findings on diagnostic imaging of other specified body structures: Secondary | ICD-10-CM

## 2017-10-09 LAB — BASIC METABOLIC PANEL
ANION GAP: 11 (ref 5–15)
Anion gap: 17 — ABNORMAL HIGH (ref 5–15)
BUN: 23 mg/dL — AB (ref 6–20)
BUN: 20 mg/dL (ref 6–20)
CHLORIDE: 96 mmol/L — AB (ref 101–111)
CO2: 24 mmol/L (ref 22–32)
CO2: 20 mmol/L — AB (ref 22–32)
CREATININE: 0.78 mg/dL (ref 0.44–1.00)
Calcium: 9.3 mg/dL (ref 8.9–10.3)
Calcium: 8.1 mg/dL — ABNORMAL LOW (ref 8.9–10.3)
Chloride: 106 mmol/L (ref 101–111)
Creatinine, Ser: 0.56 mg/dL (ref 0.44–1.00)
GFR calc Af Amer: >60 mL/min (ref >60)
GFR calc Af Amer: >60 mL/min (ref >60)
GFR calc non Af Amer: >60 mL/min (ref >60)
GLUCOSE: 93 mg/dL (ref 65–99)
Glucose, Bld: 110 mg/dL — ABNORMAL HIGH (ref 65–99)
POTASSIUM: 3 mmol/L — AB (ref 3.5–5.1)
Potassium: 3.7 mmol/L (ref 3.5–5.1)
Sodium: 137 mmol/L (ref 135–145)
Sodium: 137 mmol/L (ref 135–145)

## 2017-10-09 LAB — MAGNESIUM: MAGNESIUM: 2 mg/dL (ref 1.7–2.4)

## 2017-10-09 LAB — CBC WITH DIFFERENTIAL/PLATELET
Basophils Absolute: 0 10*3/uL (ref 0.0–0.1)
Basophils Relative: 0 %
Eosinophils Absolute: 0 10*3/uL (ref 0.0–0.7)
Eosinophils Relative: 0 %
HEMATOCRIT: 43.8 % (ref 36.0–46.0)
Hemoglobin: 14.2 g/dL (ref 12.0–15.0)
Lymphocytes Relative: 19 %
Lymphs Abs: 1.7 10*3/uL (ref 0.7–4.0)
MCH: 31.1 pg (ref 26.0–34.0)
MCHC: 32.4 g/dL (ref 30.0–36.0)
MCV: 95.8 fL (ref 78.0–100.0)
MONO ABS: 0.6 10*3/uL (ref 0.1–1.0)
MONOS PCT: 7 %
NEUTROS ABS: 6.6 10*3/uL (ref 1.7–7.7)
Neutrophils Relative %: 74 %
Platelets: 376 10*3/uL (ref 150–400)
RBC: 4.57 MIL/uL (ref 3.87–5.11)
RDW: 13.4 % (ref 11.5–15.5)
WBC: 8.9 10*3/uL (ref 4.0–10.5)

## 2017-10-09 LAB — PHOSPHORUS: Phosphorus: 3.9 mg/dL (ref 2.5–4.6)

## 2017-10-09 MED ORDER — LORAZEPAM 2 MG/ML IJ SOLN: 0.5000 mg | Freq: Once | INTRAMUSCULAR | Status: DC

## 2017-10-09 MED ORDER — METOPROLOL TARTRATE 5 MG/5ML IV SOLN: 5.0000 mg | Freq: Once | INTRAVENOUS | Status: DC

## 2017-10-09 NOTE — ED Notes (Signed)
Pt called out and stated she wanted to leave. Dr. Olevia Bowens notified and stated that pt could sign out AMA which she did. Prior explanation of AMA explained to pt by Dr Olevia Bowens earlier in evening.

## 2017-10-09 NOTE — Discharge Summary (Signed)
Physician Discharge Summary  Cynthia Church WER:[id number] DOB: [birth year] DOA: 10/08/2017  PCP: Cynthia Church  Admit date: 10/08/2017 Discharge date: 10/09/2017  Time spent: 35 minutes  Recommendations for Outpatient Follow-up:  1. Signed AMA.   Discharge Diagnoses:  Principal Problem:   Hypokalemia Active Problems:   Abnormal CT scan, neck   Alcohol dependence ([place])   COPD without exacerbation ([facility])   Tobacco abuse   Bipolar disorder ([facility])   Hypothyroidism   MDD (major depressive disorder)   Polycythemia   Discharge Condition: AMA.  Diet recommendation: None. Dysphagia/odynophagia signing AMA.  There were no vitals filed for this visit.  History of present illness:   HPI: Cynthia Church is a 52 y.o. female with medical history significant of alcoholism, bipolar affective disorder, uterine cancer, chronic pain syndrome, COPD, headache, hypothyroidism, lower back pain who was seen earlier today due to sore throat and otalgia, found to have hypokalemia.  She left before she could be fully evaluated then returned in the evening to be reevaluated and for potassium replacement.  She mentions that she has been having sore throat for several days to the point that she is unable to swallow.  She has been having dysphagia for about 6 months now and has been eating mostly mashed potatoes and pudding.  However, she mentions that in the last few days, swallowing has become more difficult and painful.  She has also decreased appetite, weight loss of a significant amount of pounds, but not sure how much, worsening fatigue and has recently stopped drinking alcohol due to dysphagia/odynophagia.  Denies fever, night sweats, wheezing, hemoptysis, productive cough, chest pain, palpitations, dizziness, diaphoresis, abdominal pain, nausea, vomiting, diarrhea, dysuria, frequency or hematuria.  No heat or cold intolerance.  Denies pruritus or skin rashes.  ED Course: Initial vital signs temperature  36.18F (98.1 F, pulse 90, respirations 19, blood pressure 119/93 and O2 sat 100%.  Earlier her white count was 7.8, hemoglobin 16.6 g/dL and platelets 376.  Potassium was 2.6 and chloride 95 millimol/L.  All other BMP values are within normal limits.  Repeat BMP after potassium replacement shows sodium 137, potassium 3.0, chloride 96, CO2 24 mmol/L.  Glucose 110, BUN 23, creatinine 0.78, calcium 9.3, phosphorus 3.9 and magnesium 2.0 mg/dL.  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 Severe airway effacement at level of hypopharynx. Probable infiltrative invasion of right parapharyngeal, prevertebral, and carotid spaces at approximatelyC2-C5 levels.  Necrotic right retropharyngeal, level 2B, and level 3 metastatic lymphadenopathy.  2-3 mm pulmonary nodules in upper lobes bilaterally.  Hospital Course:  NA  Procedures:  None.  Consultations:  Declined ENT consult and transfer to [facility].  The patient signed AMA for the second time in the last 24 hours.  Discharge Exam: Vitals:   10/09/17 0215 10/09/17 0230  BP:  (!) 142/98  Pulse:    Resp: (!) 23 18  Temp:    SpO2:      [place] refer to the H&P for physical exam. The patient signed AMA before she was reevaluated.  Discharge Instructions  Unable to provide.  Allergies as of 10/09/2017   No Known Allergies     The results of significant diagnostics from this hospitalization (including imaging, microbiology, ancillary and laboratory) are listed below for reference.    Significant Diagnostic Studies: Dg Neck Soft Tissue  Result  Date: 10/08/2017 CLINICAL DATA:  52 y/o  F; 6 months of choking sensation. EXAM: NECK SOFT TISSUES - 1+ VIEW COMPARISON:  03/05/2016 CT cervical spine FINDINGS: Airway  effacement in vallecula and hypopharynx may represent debris, mucosal lesion, or mucosal thickening. Moderate cervical spondylosis with large anterior marginal osteophytes at the C5-6 levels. No radiopaque foreign body identified. IMPRESSION: Airway effacement in vallecula and hypopharynx may represent debris, mucosal lesion, or mucosal thickening. Direct visualization recommended. Electronically Signed   By: Kristine Garbe M.D.   On: 10/08/2017 23:26   Ct Soft Tissue Neck W Contrast  Result Date: 10/09/2017 CLINICAL DATA:  52 y/o F; 6 months of sore throat. Abnormal radiographs. EXAM: CT NECK WITH CONTRAST TECHNIQUE: Multidetector CT imaging of the neck was performed using the standard protocol following the bolus administration of intravenous contrast. CONTRAST:  104mL ISOVUE-300 IOPAMIDOL (ISOVUE-300) INJECTION 61% COMPARISON:  03/05/2016 CT cervical spine. 10/08/2017 cervical spine radiographs. FINDINGS: Pharynx and larynx: Diffuse irregular an infiltrative masslike enhancement involving nasopharynx, oropharynx, base of tongue, epiglottis, hypopharynx, supraglottic space, and glottis. In the nasopharynx there is mucosal enhancement along the right lateral and posterior walls of the nasopharynx including fossa of Rosenmuller and there is a 10 mm necrotic right node of Rouviere and right retropharyngeal lymph node at C1 level (series 2, image 22, 28). In the oropharynx mucosal enhancement involves the posterior pharyngeal wall crossing the midline, right lateral pharyngeal wall, retromolar trigone connecting right soft palate to base of tongue via the pterygomandibular raphe, bilateral base of tongue with up to 17 mm invasion on the right (series 2, image 42), and to lesser degree the left lateral oropharyngeal wall. Ill-defined enhancement effacing right prevertebral space, parapharyngeal space, carotid space likely representing local soft tissue invasion from approximately C2-C5 levels. In the  hypopharynx there is irregular mucosal thickening of the epiglottis, and bulky masslike enhancement of the mucosal surfaces circumferentially with ulcerations resulting in severe airway effacement. Laryngeal involvement involves true and false cords, anterior commissure, and paraglottic fat. No definite thyroid cartilage invasion or extra laryngeal extension. Salivary glands: No definite invasion. Thyroid: Normal. Lymph nodes: Necrotic 10 mm right level 3 and 14 x 13 mm necrotic right level 2 B lymphadenopathy (series 2, image 41 and 52). Vascular: Negative. Limited intracranial: Negative. Visualized orbits: Negative. Mastoids and visualized paranasal sinuses: Clear. Skeleton: No acute or aggressive process. Upper chest: Several 2-3 mm nodules within the upper lobes bilaterally. Other: None. 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 Severe airway effacement at level of hypopharynx. Probable infiltrative invasion of right parapharyngeal, prevertebral, and carotid spaces at approximately C2-C5 levels. Necrotic right retropharyngeal, level 2B, and level 3 metastatic lymphadenopathy. 2-3 mm pulmonary nodules in upper lobes bilaterally. These results were called by telephone at the time of interpretation on 10/09/2017 at 12:37 am to Dr. Nanda Quinton , who verbally acknowledged these results. Electronically Signed   By: Kristine Garbe M.D.   On: 10/09/2017 00:43    Microbiology: Recent Results (from the past 240 hour(s))  Rapid strep screen     Status: None   Collection Time: 10/08/17 10:25 PM  Result Value Ref Range Status   Streptococcus, Group A Screen (Direct) NEGATIVE NEGATIVE Final    Comment: (NOTE) A Rapid Antigen test may result negative if the antigen level in the sample is below the detection level  of this test. The FDA has not  cleared this test as a stand-alone test therefore the rapid antigen negative result has reflexed to a Group A Strep culture. Performed at [facility], [address], [place],  [address]      Labs: Basic Metabolic Panel: Recent Labs  Lab 10/08/17 0806 10/08/17 2225  NA 139 137  K 2.6* 3.0*  CL 95* 96*  CO2 28 24  GLUCOSE 94 110*  BUN 17 23*  CREATININE 0.62 0.78  CALCIUM 9.4 9.3  MG  --  2.0  PHOS  --  3.9   Liver Function Tests: No results for input(s): AST, ALT, ALKPHOS, BILITOT, PROT, ALBUMIN in the last 168 hours. No results for input(s): LIPASE, AMYLASE in the last 168 hours. No results for input(s): AMMONIA in the last 168 hours. CBC: Recent Labs  Lab 10/08/17 0806  WBC 7.8  NEUTROABS 5.8  HGB 16.6*  HCT 50.0*  MCV 95.6  PLT 376   Cardiac Enzymes: No results for input(s): CKTOTAL, CKMB, CKMBINDEX, TROPONINI in the last 168 hours. BNP: BNP (last 3 results) No results for input(s): BNP in the last 8760 hours.  ProBNP (last 3 results) No results for input(s): PROBNP in the last 8760 hours.  CBG: No results for input(s): GLUCAP in the last 168 hours.   Signed:  Reubin Milan MD.  [facility] 10/09/2017, 3:35 AM

## 2017-10-10 LAB — HIV ANTIBODY (ROUTINE TESTING W REFLEX): HIV Screen 4th Generation wRfx: NONREACTIVE

## 2017-10-11 LAB — CULTURE, GROUP A STREP (THRC)

## 2017-10-25 ENCOUNTER — Other Ambulatory Visit: Payer: Self-pay

## 2017-10-25 ENCOUNTER — Inpatient Hospital Stay (HOSPITAL_COMMUNITY)
Admission: EM | Admit: 2017-10-25 | Discharge: 2017-11-01 | DRG: 981 | Discharge status: Against Medical Advice | Payer: Medicare Other | Attending: Internal Medicine | Admitting: Internal Medicine

## 2017-10-25 ENCOUNTER — Encounter (HOSPITAL_COMMUNITY): Payer: Self-pay

## 2017-10-25 ENCOUNTER — Inpatient Hospital Stay (HOSPITAL_COMMUNITY): Payer: Medicare Other

## 2017-10-25 DIAGNOSIS — J392 Other diseases of pharynx: Secondary | ICD-10-CM

## 2017-10-25 DIAGNOSIS — R627 Adult failure to thrive: Secondary | ICD-10-CM | POA: Diagnosis present

## 2017-10-25 DIAGNOSIS — G8929 Other chronic pain: Secondary | ICD-10-CM | POA: Diagnosis present

## 2017-10-25 DIAGNOSIS — R221 Localized swelling, mass and lump, neck: Secondary | ICD-10-CM | POA: Diagnosis present

## 2017-10-25 DIAGNOSIS — C140 Malignant neoplasm of pharynx, unspecified: Secondary | ICD-10-CM | POA: Diagnosis present

## 2017-10-25 DIAGNOSIS — E46 Unspecified protein-calorie malnutrition: Secondary | ICD-10-CM | POA: Diagnosis not present

## 2017-10-25 DIAGNOSIS — F1020 Alcohol dependence, uncomplicated: Secondary | ICD-10-CM | POA: Diagnosis present

## 2017-10-25 DIAGNOSIS — Z9119 Patient's noncompliance with other medical treatment and regimen: Secondary | ICD-10-CM

## 2017-10-25 DIAGNOSIS — Z681 Body mass index (BMI) 19 or less, adult: Secondary | ICD-10-CM | POA: Diagnosis not present

## 2017-10-25 DIAGNOSIS — G894 Chronic pain syndrome: Secondary | ICD-10-CM | POA: Diagnosis present

## 2017-10-25 DIAGNOSIS — F609 Personality disorder, unspecified: Secondary | ICD-10-CM | POA: Diagnosis present

## 2017-10-25 DIAGNOSIS — F1910 Other psychoactive substance abuse, uncomplicated: Secondary | ICD-10-CM | POA: Diagnosis present

## 2017-10-25 DIAGNOSIS — R079 Chest pain, unspecified: Secondary | ICD-10-CM

## 2017-10-25 DIAGNOSIS — R9389 Abnormal findings on diagnostic imaging of other specified body structures: Secondary | ICD-10-CM | POA: Diagnosis not present

## 2017-10-25 DIAGNOSIS — I10 Essential (primary) hypertension: Secondary | ICD-10-CM | POA: Diagnosis present

## 2017-10-25 DIAGNOSIS — Z7289 Other problems related to lifestyle: Secondary | ICD-10-CM | POA: Diagnosis not present

## 2017-10-25 DIAGNOSIS — F1494 Cocaine use, unspecified with cocaine-induced mood disorder: Secondary | ICD-10-CM | POA: Diagnosis present

## 2017-10-25 DIAGNOSIS — N390 Urinary tract infection, site not specified: Secondary | ICD-10-CM | POA: Diagnosis present

## 2017-10-25 DIAGNOSIS — F17210 Nicotine dependence, cigarettes, uncomplicated: Secondary | ICD-10-CM | POA: Diagnosis present

## 2017-10-25 DIAGNOSIS — K117 Disturbances of salivary secretion: Secondary | ICD-10-CM

## 2017-10-25 DIAGNOSIS — Z515 Encounter for palliative care: Secondary | ICD-10-CM | POA: Diagnosis not present

## 2017-10-25 DIAGNOSIS — R1319 Other dysphagia: Secondary | ICD-10-CM | POA: Diagnosis not present

## 2017-10-25 DIAGNOSIS — J449 Chronic obstructive pulmonary disease, unspecified: Secondary | ICD-10-CM | POA: Diagnosis present

## 2017-10-25 DIAGNOSIS — E43 Unspecified severe protein-calorie malnutrition: Secondary | ICD-10-CM | POA: Diagnosis present

## 2017-10-25 DIAGNOSIS — E441 Mild protein-calorie malnutrition: Secondary | ICD-10-CM

## 2017-10-25 DIAGNOSIS — Z9114 Patient's other noncompliance with medication regimen: Secondary | ICD-10-CM

## 2017-10-25 DIAGNOSIS — Z720 Tobacco use: Secondary | ICD-10-CM | POA: Diagnosis present

## 2017-10-25 DIAGNOSIS — H9201 Otalgia, right ear: Secondary | ICD-10-CM | POA: Diagnosis present

## 2017-10-25 DIAGNOSIS — F17218 Nicotine dependence, cigarettes, with other nicotine-induced disorders: Secondary | ICD-10-CM | POA: Diagnosis not present

## 2017-10-25 DIAGNOSIS — F319 Bipolar disorder, unspecified: Secondary | ICD-10-CM | POA: Diagnosis present

## 2017-10-25 DIAGNOSIS — E039 Hypothyroidism, unspecified: Secondary | ICD-10-CM | POA: Diagnosis present

## 2017-10-25 DIAGNOSIS — R1310 Dysphagia, unspecified: Secondary | ICD-10-CM | POA: Diagnosis present

## 2017-10-25 DIAGNOSIS — E860 Dehydration: Secondary | ICD-10-CM | POA: Diagnosis present

## 2017-10-25 DIAGNOSIS — E876 Hypokalemia: Principal | ICD-10-CM | POA: Diagnosis present

## 2017-10-25 DIAGNOSIS — F10220 Alcohol dependence with intoxication, uncomplicated: Secondary | ICD-10-CM | POA: Diagnosis not present

## 2017-10-25 DIAGNOSIS — F1410 Cocaine abuse, uncomplicated: Secondary | ICD-10-CM | POA: Diagnosis not present

## 2017-10-25 LAB — CBC WITH DIFFERENTIAL/PLATELET
Basophils Absolute: 0 10*3/uL (ref 0.0–0.1)
Basophils Relative: 0 %
EOS PCT: 0 %
Eosinophils Absolute: 0 10*3/uL (ref 0.0–0.7)
HCT: 46 % (ref 36.0–46.0)
Hemoglobin: 16.7 g/dL — ABNORMAL HIGH (ref 12.0–15.0)
LYMPHS ABS: 1.4 10*3/uL (ref 0.7–4.0)
LYMPHS PCT: 16 %
MCH: 33.1 pg (ref 26.0–34.0)
MCHC: 36.3 g/dL — ABNORMAL HIGH (ref 30.0–36.0)
MCV: 91.3 fL (ref 78.0–100.0)
MONOS PCT: 8 %
Monocytes Absolute: 0.7 10*3/uL (ref 0.1–1.0)
Neutro Abs: 6.6 10*3/uL (ref 1.7–7.7)
Neutrophils Relative %: 76 %
PLATELETS: 234 10*3/uL (ref 150–400)
RBC: 5.04 MIL/uL (ref 3.87–5.11)
RDW: 13.8 % (ref 11.5–15.5)
WBC: 8.8 10*3/uL (ref 4.0–10.5)

## 2017-10-25 LAB — COMPREHENSIVE METABOLIC PANEL
ALK PHOS: 61 U/L (ref 38–126)
ALT: 13 U/L — AB (ref 14–54)
AST: 24 U/L (ref 15–41)
Albumin: 3.6 g/dL (ref 3.5–5.0)
Anion gap: 17 — ABNORMAL HIGH (ref 5–15)
BUN: 16 mg/dL (ref 6–20)
CALCIUM: 9.4 mg/dL (ref 8.9–10.3)
CO2: 26 mmol/L (ref 22–32)
CREATININE: 0.98 mg/dL (ref 0.44–1.00)
Chloride: 89 mmol/L — ABNORMAL LOW (ref 101–111)
GFR calc Af Amer: >60 mL/min (ref >60)
Glucose, Bld: 100 mg/dL — ABNORMAL HIGH (ref 65–99)
Potassium: 2.3 mmol/L — CL (ref 3.5–5.1)
Sodium: 132 mmol/L — ABNORMAL LOW (ref 135–145)
Total Bilirubin: 1.3 mg/dL — ABNORMAL HIGH (ref 0.3–1.2)
Total Protein: 7.8 g/dL (ref 6.5–8.1)

## 2017-10-25 LAB — PHOSPHORUS: Phosphorus: 3 mg/dL (ref 2.5–4.6)

## 2017-10-25 LAB — MAGNESIUM: MAGNESIUM: 1.8 mg/dL (ref 1.7–2.4)

## 2017-10-25 MED ORDER — DEXTROSE-NACL 5-0.45 % IV SOLN
INTRAVENOUS | Status: AC
  Administered 2017-10-26: via INTRAVENOUS

## 2017-10-25 MED ORDER — ONDANSETRON HCL 4 MG PO TABS: 4.0000 mg | ORAL_TABLET | Freq: Four times a day (QID) | ORAL | Status: DC | PRN

## 2017-10-25 MED ORDER — POTASSIUM CHLORIDE 10 MEQ/100ML IV SOLN
10.0000 meq | INTRAVENOUS | Status: AC
  Administered 2017-10-25 (×3): 10 meq via INTRAVENOUS
  Filled 2017-10-25 (×3): qty 100

## 2017-10-25 MED ORDER — ACETAMINOPHEN 650 MG RE SUPP: 650.0000 mg | Freq: Four times a day (QID) | RECTAL | Status: DC | PRN

## 2017-10-25 MED ORDER — IOPAMIDOL (ISOVUE-300) INJECTION 61%
100.0000 mL | Freq: Once | INTRAVENOUS | Status: AC | PRN
  Administered 2017-10-25: 100 mL via INTRAVENOUS

## 2017-10-25 MED ORDER — IOPAMIDOL (ISOVUE-300) INJECTION 61%
INTRAVENOUS | Status: AC
  Filled 2017-10-25: qty 100

## 2017-10-25 MED ORDER — ONDANSETRON HCL 4 MG/2ML IJ SOLN
4.0000 mg | Freq: Four times a day (QID) | INTRAMUSCULAR | Status: DC | PRN
  Administered 2017-10-26: 4 mg via INTRAVENOUS
  Filled 2017-10-25: qty 2

## 2017-10-25 MED ORDER — MAGNESIUM SULFATE 2 GM/50ML IV SOLN
2.0000 g | Freq: Once | INTRAVENOUS | Status: AC
  Administered 2017-10-25: 2 g via INTRAVENOUS
  Filled 2017-10-25: qty 50

## 2017-10-25 MED ORDER — POTASSIUM CHLORIDE 10 MEQ/100ML IV SOLN: 10.0000 meq | INTRAVENOUS | Status: DC

## 2017-10-25 MED ORDER — MORPHINE SULFATE (PF) 4 MG/ML IV SOLN
4.0000 mg | Freq: Once | INTRAVENOUS | Status: AC
  Administered 2017-10-25: 4 mg via INTRAVENOUS
  Filled 2017-10-25: qty 1

## 2017-10-25 MED ORDER — THIAMINE HCL 100 MG/ML IJ SOLN
100.0000 mg | INTRAMUSCULAR | Status: AC
  Administered 2017-10-25: 100 mg via INTRAVENOUS
  Filled 2017-10-25: qty 2

## 2017-10-25 MED ORDER — MORPHINE SULFATE (PF) 4 MG/ML IV SOLN
2.0000 mg | INTRAVENOUS | Status: DC | PRN
  Administered 2017-10-25 – 2017-10-26 (×2): 4 mg via INTRAVENOUS
  Administered 2017-10-26: 2 mg via INTRAVENOUS
  Administered 2017-10-26 – 2017-10-30 (×19): 4 mg via INTRAVENOUS
  Filled 2017-10-25 (×22): qty 1

## 2017-10-25 MED ORDER — POTASSIUM CHLORIDE CRYS ER 20 MEQ PO TBCR: 40.0000 meq | EXTENDED_RELEASE_TABLET | Freq: Once | ORAL | Status: DC

## 2017-10-25 MED ORDER — ONDANSETRON HCL 4 MG/2ML IJ SOLN
4.0000 mg | Freq: Once | INTRAMUSCULAR | Status: AC
  Administered 2017-10-25: 4 mg via INTRAVENOUS
  Filled 2017-10-25: qty 2

## 2017-10-25 MED ORDER — FOLIC ACID 5 MG/ML IJ SOLN
1.0000 mg | Freq: Every day | INTRAMUSCULAR | Status: DC
  Administered 2017-10-27 – 2017-10-31 (×5): 1 mg via INTRAVENOUS
  Filled 2017-10-25 (×8): qty 0.2

## 2017-10-25 MED ORDER — ACETAMINOPHEN 325 MG PO TABS
650.0000 mg | ORAL_TABLET | Freq: Four times a day (QID) | ORAL | Status: DC | PRN
  Filled 2017-10-25: qty 2

## 2017-10-25 MED ORDER — THIAMINE HCL 100 MG/ML IJ SOLN
100.0000 mg | Freq: Every day | INTRAMUSCULAR | Status: DC
  Administered 2017-10-26 – 2017-10-31 (×6): 100 mg via INTRAVENOUS
  Filled 2017-10-25 (×7): qty 2

## 2017-10-25 MED ORDER — POTASSIUM CHLORIDE 10 MEQ/100ML IV SOLN
10.0000 meq | INTRAVENOUS | Status: AC
  Administered 2017-10-25: 10 meq via INTRAVENOUS
  Filled 2017-10-25: qty 100

## 2017-10-25 MED ORDER — SODIUM CHLORIDE 0.9 % IV BOLUS (SEPSIS)
1000.0000 mL | Freq: Once | INTRAVENOUS | Status: AC
  Administered 2017-10-25: 1000 mL via INTRAVENOUS

## 2017-10-25 NOTE — ED Notes (Addendum)
10/25/2017 15:49,   Called pt. And spoke with her husband.  Pt. Was not there and he will give her a message to call [phone].  Pt. Left before she was seen by an MD and she has abnormal labs.  Asked her husband to have pt. Call as soon as she gets in. Pt.s husband verbalized understanding and he repeated the phone number that  Pt. Needs to call.

## 2017-10-25 NOTE — ED Provider Notes (Signed)
[facility] EMERGENCY DEPARTMENT Provider Note   CSN: [id number] Arrival date & time: 10/25/17  1240     History   Chief Complaint No chief complaint on file.   HPI Cynthia Church is a 52 y.o. female.  HPI Cynthia Church is a 52 y.o. female urgency department with complaint of generalized weakness, difficulty swallowing due to a newly found neck mass, weight loss, generalized malaise.  Patient states that she has had difficulty swallowing and felt a mass in her neck for about 6 months.  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  She was admitted but left AGAINST MEDICAL ADVICE.  She followed up with Dr. Constance Holster in the office 3 days ago, he wanted to admit her, however she refused at that time.  Patient reports that since diagnosis of the mass she has been doing crack pretty much every day.  She also admits to heavy smoking and alcohol use.  She states that she is still able to swallow small sips of liquid but denies any being able to swallow solids.  She states she has to spit her saliva throughout the day.  She states she has lost about 80 pounds in the last 3 months.  She reports severe pain in her right side of the throat or masses and reports pain in the right hip.  She denies any difficulty breathing.  Past Medical History:  Diagnosis Date  . Alcoholism ([place])   . Bipolar affective disorder, manic ([place]) 1993   Dx'd at [facility]  . Cancer ([place]) 1989   uterine  . Chronic pain   . COPD (chronic obstructive pulmonary disease) ([place])   . Headache(784.0) 09/25/2011  . Hyperthyroidism 1993  . Low back pain   . Mental disorder   . Personality disorders 1993    Patient Active Problem List   Diagnosis Date Noted  . Abnormal CT scan, neck 10/09/2017  . Hypokalemia 10/08/2017  . Polycythemia 10/08/2017  . MDD (major depressive disorder) 07/07/2016  . COPD  without exacerbation ([place]) 06/05/2016  . Tobacco abuse 06/05/2016  . Bipolar disorder ([place]) 06/05/2016  . Chronic pain syndrome 06/05/2016  . 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 Abdominal aortic atherosclerosis ([place]) 06/05/2016  . Hypothyroidism 06/05/2016  . Alcohol dependence ([place]) 09/29/2011    Past Surgical History:  Procedure Laterality Date  . BACK SURGERY    . BREAST SURGERY  2003   to check for possible cancer  . cauterization of uterine cancer  2008  . tubal ligaation      OB History    Gravida Para Term Preterm AB Living             0   SAB TAB Ectopic Multiple Live Births                   Home Medications    Prior to Admission medications   Medication Sig Start Date End Date Taking? Authorizing Provider  diphenhydrAMINE (BENADRYL) 25 MG tablet Take 1 tablet (25 mg total) by mouth every 6 (six) hours. 01/02/17   Jola Schmidt, MD  famotidine (PEPCID) 20 MG tablet Take 1 tablet (20 mg total) by mouth 2 (two) times daily. 01/02/17   Jola Schmidt, MD  ibuprofen (ADVIL,MOTRIN) 800 MG tablet Take 1 tablet (800 mg total) by mouth every 8 (eight) hours as needed for moderate  pain. 01/23/17   Milton Ferguson, MD  levofloxacin (LEVAQUIN) 500 MG tablet Take 1 tablet (500 mg total) by mouth daily. 01/02/17   Jola Schmidt, MD  metoprolol tartrate (LOPRESSOR) 25 MG tablet Take 1 tablet (25 mg total) by mouth 2 (two) times daily. 07/10/16   Kerrie Buffalo, NP  predniSONE (DELTASONE) 10 MG tablet Take 6 tablets (60 mg total) by mouth daily. 01/02/17   Jola Schmidt, MD  traZODone (DESYREL) 50 MG tablet Take 1 tablet (50 mg total) by mouth at bedtime as needed for sleep. Patient not taking: Reported on 08/12/2016 07/10/16   Kerrie Buffalo, NP    Family History Family History  Problem Relation Age of Onset  . Cirrhosis Father     Social History Social History   Tobacco Use  . Smoking status: Current Every Day Smoker    Packs/day: 3.00    Years: 28.00     Pack years: 84.00    Types: Cigarettes  . Smokeless tobacco: Never Used  Substance Use Topics  . Alcohol use: Yes    Comment: daily  . Drug use: Yes    Types: Marijuana, Cocaine    Comment: yesterday     Allergies   Patient has no known allergies.   Review of Systems Review of Systems  Constitutional: Positive for appetite change and fatigue. Negative for chills and fever.  HENT: Positive for sore throat and trouble swallowing.   Respiratory: Negative for cough, chest tightness and shortness of breath.   Cardiovascular: Negative for chest pain, palpitations and leg swelling.  Gastrointestinal: Negative for abdominal pain, diarrhea, nausea and vomiting.  Genitourinary: Negative for dysuria, flank pain and pelvic pain.  Musculoskeletal: Positive for arthralgias. Negative for myalgias, neck pain and neck stiffness.  Skin: Negative for rash.  Neurological: Positive for weakness. Negative for dizziness and headaches.  All other systems reviewed and are negative.    Physical Exam Updated Vital Signs BP 139/86   Pulse (!) 105   Temp 98.4 F (36.9 C) (Oral)   Resp 17   SpO2 100%   Physical Exam  Constitutional: She appears well-developed.  Cachectic  HENT:  Head: Normocephalic.  Palpable mass in the right submandibular area and right proximal neck.  There is erythema and thick mucus buildup in the right tonsillar area of the throat.  Uvula is midline.  Eyes: Conjunctivae are normal.  Neck: Neck supple.  Cardiovascular: Normal rate, regular rhythm and normal heart sounds.  Pulmonary/Chest: Effort normal and breath sounds normal. No respiratory distress. She has no wheezes. She has no rales.  Abdominal: Soft. Bowel sounds are normal. She exhibits no distension. There is no tenderness. There is no rebound.  Musculoskeletal: She exhibits no edema.  Neurological: She is alert.  Skin: Skin is warm and dry.  Psychiatric: She has a normal mood and affect. Her behavior is normal.   Nursing note and vitals reviewed.    ED Treatments / Results  Labs (all labs ordered are listed, but only abnormal results are displayed) Labs Reviewed  CBC WITH DIFFERENTIAL/PLATELET - Abnormal; Notable for the following components:      Result Value   Hemoglobin 16.7 (*)    MCHC 36.3 (*)    All other components within normal limits  COMPREHENSIVE METABOLIC PANEL - Abnormal; Notable for the following components:   Sodium 132 (*)    Potassium 2.3 (*)    Chloride 89 (*)    Glucose, Bld 100 (*)    ALT 13 (*)  Total Bilirubin 1.3 (*)    Anion gap 17 (*)    All other components within normal limits  URINALYSIS, ROUTINE W REFLEX MICROSCOPIC  MAGNESIUM    EKG  EKG Interpretation  Date/Time:  Friday October 25 2017 19:56:14 EDT Ventricular Rate:  77 PR Interval:    QRS Duration: 80 QT Interval:  413 QTC Calculation: 468 R Axis:   82 Text Interpretation:  Sinus rhythm Right atrial enlargement Nonspecific repol abnormality, diffuse leads Baseline wander in lead(s) V4 No significant change since last tracing Confirmed by Orlie Dakin [phone]) on 10/25/2017 8:47:11 PM       Radiology Ct Chest W Contrast  Result Date: 10/25/2017 CLINICAL DATA:  Acute onset of generalized chest and epigastric pain. Recent weight loss. Current history of head and neck malignancy. EXAM: CT CHEST, ABDOMEN, AND PELVIS WITH CONTRAST TECHNIQUE: Multidetector CT imaging of the chest, abdomen and pelvis was performed following the standard protocol during bolus administration of intravenous contrast. CONTRAST:  151mL ISOVUE-300 IOPAMIDOL (ISOVUE-300) INJECTION 61% COMPARISON:  Chest radiograph performed 01/23/2017, and CT of the abdomen and pelvis performed 03/05/2016 FINDINGS: CT CHEST FINDINGS Cardiovascular: The heart is normal in size. The thoracic aorta is unremarkable. The great vessels are within normal limits. Mediastinum/Nodes: The mediastinum is unremarkable in appearance. No mediastinal  lymphadenopathy is seen. The thyroid gland is grossly unremarkable in appearance. No axillary lymphadenopathy is seen. Lungs/Pleura: A tiny peripheral opacity at the right middle lobe is thought to reflect atelectasis. A calcified granuloma is noted at the left lung base. No focal consolidation, pleural effusion or pneumothorax is seen. No dominant mass is identified. Musculoskeletal: No acute osseous abnormalities are identified. The visualized musculature is unremarkable in appearance. Enhancement anterior to the trachea at the level of the neck is thought to reflect prior procedure in this region. CT ABDOMEN PELVIS FINDINGS Hepatobiliary: The liver is unremarkable in appearance. The gallbladder is unremarkable in appearance. The common bile duct remains normal in caliber. Pancreas: The pancreas is within normal limits. Spleen: The spleen is unremarkable in appearance. Adrenals/Urinary Tract: The adrenal glands are unremarkable in appearance. The kidneys are within normal limits. There is no evidence of hydronephrosis. No renal or ureteral stones are identified. No perinephric stranding is seen. Stomach/Bowel: The stomach is unremarkable in appearance. The small bowel is within normal limits. The appendix is not visualized; there is no evidence for appendicitis. The colon is unremarkable in appearance. Mild soft tissue inflammation is noted along the left paracolic gutter, of uncertain significance. This could reflect an omental infarct. Vascular/Lymphatic: Scattered calcification is seen along the abdominal aorta and its branches. The abdominal aorta is otherwise grossly unremarkable. The inferior vena cava is grossly unremarkable. No retroperitoneal lymphadenopathy is seen. No pelvic sidewall lymphadenopathy is identified. Reproductive: The bladder is mildly distended and grossly unremarkable. The uterus is grossly unremarkable. No suspicious adnexal masses are seen. Other: No additional soft tissue  abnormalities are seen. Musculoskeletal: No acute osseous abnormalities are identified. The visualized musculature is unremarkable in appearance. IMPRESSION: 1. Mild soft tissue inflammation along the left paracolic gutter, of uncertain significance. This could reflect an omental infarct. 2. No evidence of metastatic disease to the chest, abdomen or pelvis. Aortic Atherosclerosis (ICD10-I70.0). Electronically Signed   By: Garald Balding M.D.   On: 10/25/2017 22:47   Ct Abdomen Pelvis W Contrast  Result Date: 10/25/2017 CLINICAL DATA:  Acute onset of generalized chest and epigastric pain. Recent weight loss. Current history of head and neck malignancy. EXAM: CT CHEST, ABDOMEN,  AND PELVIS WITH CONTRAST TECHNIQUE: Multidetector CT imaging of the chest, abdomen and pelvis was performed following the standard protocol during bolus administration of intravenous contrast. CONTRAST:  111mL ISOVUE-300 IOPAMIDOL (ISOVUE-300) INJECTION 61% COMPARISON:  Chest radiograph performed 01/23/2017, and CT of the abdomen and pelvis performed 03/05/2016 FINDINGS: CT CHEST FINDINGS Cardiovascular: The heart is normal in size. The thoracic aorta is unremarkable. The great vessels are within normal limits. Mediastinum/Nodes: The mediastinum is unremarkable in appearance. No mediastinal lymphadenopathy is seen. The thyroid gland is grossly unremarkable in appearance. No axillary lymphadenopathy is seen. Lungs/Pleura: A tiny peripheral opacity at the right middle lobe is thought to reflect atelectasis. A calcified granuloma is noted at the left lung base. No focal consolidation, pleural effusion or pneumothorax is seen. No dominant mass is identified. Musculoskeletal: No acute osseous abnormalities are identified. The visualized musculature is unremarkable in appearance. Enhancement anterior to the trachea at the level of the neck is thought to reflect prior procedure in this region. CT ABDOMEN PELVIS FINDINGS Hepatobiliary: The liver is  unremarkable in appearance. The gallbladder is unremarkable in appearance. The common bile duct remains normal in caliber. Pancreas: The pancreas is within normal limits. Spleen: The spleen is unremarkable in appearance. Adrenals/Urinary Tract: The adrenal glands are unremarkable in appearance. The kidneys are within normal limits. There is no evidence of hydronephrosis. No renal or ureteral stones are identified. No perinephric stranding is seen. Stomach/Bowel: The stomach is unremarkable in appearance. The small bowel is within normal limits. The appendix is not visualized; there is no evidence for appendicitis. The colon is unremarkable in appearance. Mild soft tissue inflammation is noted along the left paracolic gutter, of uncertain significance. This could reflect an omental infarct. Vascular/Lymphatic: Scattered calcification is seen along the abdominal aorta and its branches. The abdominal aorta is otherwise grossly unremarkable. The inferior vena cava is grossly unremarkable. No retroperitoneal lymphadenopathy is seen. No pelvic sidewall lymphadenopathy is identified. Reproductive: The bladder is mildly distended and grossly unremarkable. The uterus is grossly unremarkable. No suspicious adnexal masses are seen. Other: No additional soft tissue abnormalities are seen. Musculoskeletal: No acute osseous abnormalities are identified. The visualized musculature is unremarkable in appearance. IMPRESSION: 1. Mild soft tissue inflammation along the left paracolic gutter, of uncertain significance. This could reflect an omental infarct. 2. No evidence of metastatic disease to the chest, abdomen or pelvis. Aortic Atherosclerosis (ICD10-I70.0). Electronically Signed   By: Garald Balding M.D.   On: 10/25/2017 22:47    Procedures Procedures (including critical care time)  Medications Ordered in ED Medications  potassium chloride SA (K-DUR,KLOR-CON) CR tablet 40 mEq (not administered)  potassium chloride 10 mEq  in 100 mL IVPB (not administered)  magnesium sulfate IVPB 2 g 50 mL (not administered)  sodium chloride 0.9 % bolus 1,000 mL (not administered)     Initial Impression / Assessment and Plan / ED Course  I have reviewed the triage vital signs and the nursing notes.  Pertinent labs & imaging results that were available during my care of the patient were reviewed by me and considered in my medical decision making (see chart for details).     Patient with polysubstance abuse, here for further evaluation of the neck mass.  She is sitting in bed and spitting into a bag.  She states she is able to swallow but is uncomfortable and painful.  She appears to be malnourished.  She is dry.  Sent here by Dr. Constance Holster for admission, he will see her in the hospital.  I did not speak directly with Dr. Constance Holster, Margarita Mail PA-C spoke with him.  I will start her with IV fluids, her potassium is 2.3, will replenish potassium and obtain magnesium levels.   Discussed pt with [facility] hospital[facility], they will admit her.   Vitals:   10/25/17 1821 10/25/17 2000 10/25/17 2100 10/25/17 2305  BP: 105/73 106/62 93/60 (!) 90/57  Pulse: 80 71 68 72  Resp: 17 12 12 15   Temp:      TempSrc:      SpO2: 100% 99% 97% 99%  Weight:      Height:         Final Clinical Impressions(s) / ED Diagnoses   Final diagnoses:  Neck mass  Malnutrition [facility])    ED Discharge Orders    None       Jeannett Senior, PA-C 10/26/17 [place], MD 10/26/17 [phone]

## 2017-10-25 NOTE — H&P (Signed)
Cynthia Church GXQ:[id number] DOB: August 09, 1966 DOA: 10/25/2017     PCP: Patient, No Pcp Per   Outpatient Specialists: No primary care provider on file.    Patient coming from:   home Lives  With family    Chief Complaint: was sent in by ENT HPI: [name] is a 52 y.o. female with medical history significant of alcoholism, bipolar affective disorder, uterine cancer, chronic pain syndrome, COPD, headache, hypothyroidism   Presented with progressive inability to swallow at this point she is no longer able to eat and has to spit out her saliva. Patient reportedly has been seen by Dr. Constance Holster on Tuesday.  He wanted to have her admitted back then but patient refused she was finally admitted to emergency department today and first was unsure if she wants to stay and refusing to answer any questions at this point she is willing to stay and be further evaluated.  Reports had to be seen in any apparent last week for rehydration needed to be given potassium.  As per   review of records patient had been evaluated in the emergency department early in February but left AMA she was attempted to be admitted in the end of February but left AMA again.   She reports 6 months of dysphasia with progressive swallowing problems and throat pain at first she was able to eat mashed potatoes and pudding but no longer.  She reports significant weight loss progressive fatigue.  Patient has history of alcohol abuse but have to stop drinking as she is unable to swallow very well.  No associated fevers or chills no chest pain or shortness of breath nausea vomiting or diarrhea.  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     While in ER:  Significant initial  Findings:  WBC 8.8 Hemoglobin & Hematocrit     Component Value Date/Time   HGB 16.7 (H) 10/25/2017 1404   HCT 46.0 10/25/2017 1404   Magnesium 1.8  Lab Results  Component Value Date   CREATININE 0.98 10/25/2017   CREATININE 0.56 10/09/2017   CREATININE 0.78 10/08/2017   Na 132 K 2.3 Alb 3.6      IN ER:  Temp (24hrs), Avg:98.4 F (36.9 C), Min:98.4 F (36.9 C), Max:98.4 F (36.9 C)      on arrival  ED Triage Vitals  Enc Vitals Group     BP 10/25/17 1351 (!) 113/92     Pulse Rate 10/25/17 1351 (!) 102     Resp 10/25/17 1351 18     Temp 10/25/17 1351 98.4 F (36.9 C)     Temp Source 10/25/17 1351 Oral     SpO2 10/25/17 1351 97 %     Weight 10/25/17 1748 80 lb 9.6 oz (36.6 kg)     Height 10/25/17 1748 5\' 4"  (1.626 m)     Head Circumference --      Peak Flow --      Pain Score --      Pain Loc --      Pain Edu? --      Excl. in [place]? --      Latest   Blood pressure 105/73, pulse 80, temperature 98.4  F (36.9 C), temperature source Oral, resp. rate 17, height 5\' 4"  (1.626 m), weight 36.6 kg (80 lb 9.6 oz), SpO2 100 %.   Following Medications were ordered in ER: Medications  potassium chloride SA (K-DUR,KLOR-CON) CR tablet 40 mEq (40 mEq Oral Refused 10/25/17 1807)  potassium chloride 10 mEq in 100 mL IVPB (10 mEq Intravenous New Bag/Given 10/25/17 1801)  magnesium sulfate IVPB 2 g 50 mL (2 g Intravenous New Bag/Given 10/25/17 1800)  sodium chloride 0.9 % bolus 1,000 mL (1,000 mLs Intravenous New Bag/Given 10/25/17 1801)  morphine 4 MG/ML injection 4 mg (4 mg Intravenous Given 10/25/17 1819)  ondansetron (ZOFRAN) injection 4 mg (4 mg Intravenous Given 10/25/17 1819)     Hospitalist was called for admission for malnutrition hypokalemia   Review of Systems:    Pertinent positives include: Dysphasia  Constitutional:  No weight loss, night sweats, Fevers, chills, fatigue,  weight loss  HEENT:  No headaches, Difficulty swallowing,Tooth/dental problems,Sore throat,  No sneezing, itching, ear ache, nasal congestion, post nasal drip,  Cardio-vascular:  No chest pain, Orthopnea, PND, anasarca, dizziness, palpitations.no Bilateral lower extremity swelling  GI:  No heartburn, indigestion, abdominal pain, nausea, vomiting, diarrhea, change in bowel habits, loss of appetite, melena, blood in stool, hematemesis Resp:  no shortness of breath at rest. No dyspnea on exertion, No excess mucus, no productive cough, No non-productive cough, No coughing up of blood.No change in color of mucus.No wheezing. Skin:  no rash or lesions. No jaundice GU:  no dysuria, change in color of urine, no urgency or frequency. No straining to urinate.  No flank pain.  Musculoskeletal:  No joint pain or no joint swelling. No decreased range of motion. No back pain.  Psych:  No change in mood or affect. No depression or anxiety. No memory loss.  Neuro: no localizing neurological complaints, no tingling, no weakness, no double vision, no gait abnormality, no slurred speech, no confusion  As per HPI otherwise 10 point review of systems negative.   Past Medical History: Past Medical History:  Diagnosis Date  . Alcoholism ([place])   . Bipolar affective disorder, manic ([place]) 1993   Dx'd at [facility]  . Cancer ([place]) 1989   uterine  . Chronic pain   . COPD (chronic obstructive pulmonary disease) ([place])   . Headache(784.0) 09/25/2011  . Hyperthyroidism 1993  . Low back pain   . Mental disorder   . Personality disorders 1993   Past Surgical History:  Procedure Laterality Date  . BACK SURGERY    . BREAST SURGERY  2003   to check for possible cancer  . cauterization of uterine cancer  2008  . tubal ligaation       Social History:  Ambulatory  independently      reports that she has been smoking cigarettes.  She has a 84.00 pack-year smoking history. she has never used smokeless  tobacco. She reports that she drinks alcohol. She reports that she uses drugs. Drugs: Marijuana and Cocaine.  Allergies:  No Known Allergies   Family History:   Family History  Problem Relation Age of Onset  . Cirrhosis Father     Medications: Prior to Admission medications   Medication Sig Start Date End Date Taking? Authorizing Provider  diphenhydrAMINE (BENADRYL) 25 MG tablet Take 1 tablet (25 mg total) by mouth every 6 (six) hours. Patient not taking: Reported on 10/25/2017 01/02/17   Jola Schmidt, MD  famotidine (PEPCID) 20 MG tablet Take 1 tablet (20 mg total) by  mouth 2 (two) times daily. Patient not taking: Reported on 10/25/2017 01/02/17   Jola Schmidt, MD  ibuprofen (ADVIL,MOTRIN) 800 MG tablet Take 1 tablet (800 mg total) by mouth every 8 (eight) hours as needed for moderate pain. Patient not taking: Reported on 10/25/2017 01/23/17   Milton Ferguson, MD  levofloxacin (LEVAQUIN) 500 MG tablet Take 1 tablet (500 mg total) by mouth daily. Patient not taking: Reported on 10/25/2017 01/02/17   Jola Schmidt, MD  metoprolol tartrate (LOPRESSOR) 25 MG tablet Take 1 tablet (25 mg total) by mouth 2 (two) times daily. Patient not taking: Reported on 10/25/2017 07/10/16   Kerrie Buffalo, NP  predniSONE (DELTASONE) 10 MG tablet Take 6 tablets (60 mg total) by mouth daily. Patient not taking: Reported on 10/25/2017 01/02/17   Jola Schmidt, MD  traZODone (DESYREL) 50 MG tablet Take 1 tablet (50 mg total) by mouth at bedtime as needed for sleep. Patient not taking: Reported on 08/12/2016 07/10/16   Kerrie Buffalo, NP    Physical Exam: Patient Vitals for the past 24 hrs:  BP Temp Temp src Pulse Resp SpO2 Height Weight  10/25/17 1821 105/73 - - 80 17 100 % - -  10/25/17 1748 - - - - - - 5\' 4"  (1.626 m) 36.6 kg (80 lb 9.6 oz)  10/25/17 1701 139/86 - - (!) 105 17 100 % - -  10/25/17 1351 (!) 113/92 98.4 F (36.9 C) Oral (!) 102 18 97 % - -    1. General:  in No Acute distress  Chronically  ill cachectic  -appearing 2. Psychological: Alert and  Oriented 3. Head/ENT:    Dry Mucous Membranes                          Head Non traumatic, neck supple  firm extensive lymphadenopathy submandibular                           Poor Dentition 4. SKIN:  decreased Skin turgor,  Skin clean Dry and intact no rash 5. Heart: Regular rate and rhythm no  Murmur, no Rub or gallop 6. Lungs:   no wheezes or crackles   7. Abdomen: Soft, epigastric tenderness, Non distended thin 8. Lower extremities: no clubbing, cyanosis, or edema 9. Neurologically Grossly intact, moving all 4 extremities equally  10. MSK: Normal range of motion Chest wall tenderness in the right fifth palpation  body mass index is 13.83 kg/m.  Labs on Admission:   Labs on Admission: I have personally reviewed following labs and imaging studies  CBC: Recent Labs  Lab 10/25/17 1404  WBC 8.8  NEUTROABS 6.6  HGB 16.7*  HCT 46.0  MCV 91.3  PLT 676   Basic Metabolic Panel: Recent Labs  Lab 10/25/17 1404 10/25/17 1750  NA 132*  --   K 2.3*  --   CL 89*  --   CO2 26  --   GLUCOSE 100*  --   BUN 16  --   CREATININE 0.98  --   CALCIUM 9.4  --   MG  --  1.8   GFR: Estimated Creatinine Clearance: 39.7 mL/min (by C-G formula based on SCr of 0.98 mg/dL). Liver Function Tests: Recent Labs  Lab 10/25/17 1404  AST 24  ALT 13*  ALKPHOS 61  BILITOT 1.3*  PROT 7.8  ALBUMIN 3.6   No results for input(s): LIPASE, AMYLASE in the last 168 hours. No results  for input(s): AMMONIA in the last 168 hours. Coagulation Profile: No results for input(s): INR, PROTIME in the last 168 hours. Cardiac Enzymes: No results for input(s): CKTOTAL, CKMB, CKMBINDEX, TROPONINI in the last 168 hours. BNP (last 3 results) No results for input(s): PROBNP in the last 8760 hours. HbA1C: No results for input(s): HGBA1C in the last 72 hours. CBG: No results for input(s): GLUCAP in the last 168 hours. Lipid Profile: No results for  input(s): CHOL, HDL, LDLCALC, TRIG, CHOLHDL, LDLDIRECT in the last 72 hours. Thyroid Function Tests: No results for input(s): TSH, T4TOTAL, FREET4, T3FREE, THYROIDAB in the last 72 hours. Anemia Panel: No results for input(s): VITAMINB12, FOLATE, FERRITIN, TIBC, IRON, RETICCTPCT in the last 72 hours. Urine analysis:    Component Value Date/Time   COLORURINE YELLOW 12/03/2015 1423   APPEARANCEUR CLEAR 12/03/2015 1423   LABSPEC <1.005 (L) 12/03/2015 1423   PHURINE 5.5 12/03/2015 1423   GLUCOSEU NEGATIVE 12/03/2015 1423   HGBUR NEGATIVE 12/03/2015 [place] 12/03/2015 1423   KETONESUR NEGATIVE 12/03/2015 1423   PROTEINUR NEGATIVE 12/03/2015 1423   UROBILINOGEN 0.2 06/01/2015 1329   NITRITE NEGATIVE 12/03/2015 1423   LEUKOCYTESUR NEGATIVE 12/03/2015 1423   Sepsis Labs: @LABRCNTIP (procalcitonin:4,lacticidven:4) )No results found for this or any previous visit (from the past 240 hour(s)).     UA  not ordered  No results found for: HGBA1C  Estimated Creatinine Clearance: 39.7 mL/min (by C-G formula based on SCr of 0.98 mg/dL).  BNP (last 3 results) No results for input(s): PROBNP in the last 8760 hours.   ECG REPORT  Independently reviewed Rate:77  Rhythm: nSR ST&T Change: No acute ischemic changes   QTC 468  Filed Weights   10/25/17 1748  Weight: 36.6 kg (80 lb 9.6 oz)     Cultures:    Component Value Date/Time   SDES  10/08/2017 2225    THROAT Performed at [facility], [address] [place], [place] [address]    [facility]  10/08/2017 2225    NONE Reflexed from [id number] Performed at [facility], [address], [place], [place] [address]    CULT FEW STREPTOCOCCUS,BETA HEMOLYTIC NOT GROUP A 10/08/2017 2225   REPTSTATUS 10/11/2017 FINAL 10/08/2017 2225     Radiological Exams on Admission: No results found.  Chart has been reviewed    Assessment/Plan   52 y.o. female with medical history significant of alcoholism, bipolar affective  disorder, uterine cancer, chronic pain syndrome, COPD, headache, hypothyroidism   Admitted for hypokalemia in the setting of malnutrition secondary to inability to eat secondary to head and neck cancer  Present on Admission: . Hypokalemia - - will replace and repeat in AM,  check magnesium level and replace as needed  . Tobacco abuse -  - Spoke about importance of quitting,   - order nicotine patch   - nursing tobacco cessation protocol  . Chronic pain syndrome - stable . Bipolar disorder ([place]) - stable, currently unable to tolerate PO  . Abnormal CT scan, neck -mild slightly head and neck malignancy, please consult ENT in a.m. Dr. Constance Holster have seen patient in the past.  Plan to admit for IV fluid resuscitation IR consult for feeding tube placement hoping for being able to obtain biopsy in the future . Chest pain reproducible by palpation will evaluate for any potential rib fractures pathological otherwise . Right ear pain suspect secondary to underlying malignancy supportive management for now Substance abuse -would benefit from social work consult at this point interested in quitting  Other plan as per orders.  DVT prophylaxis:  SCD  Code Status:  FULL CODE  as per patient   Family Communication:   Family not  at  Bedside    Disposition Plan:    likely will need placement for rehabilitation                                                  Would benefit from PT/OT eval prior to DC   ordered                       Social Work  Nutrition   consulted                          Consults called: none  Admission status:     inpatient     Level of care   tele         I have spent a total of 56 min on this admission   Nickolette Espinola 10/25/2017, 9:55 PM    [facility]  Pager [phone]   after 2 AM please page floor coverage PA If 7AM-7PM, please contact the day team taking care of the patient  [email or web address]  Password [email or web address]

## 2017-10-25 NOTE — ED Notes (Signed)
Pt's significant other contact info:  Lauris Chroman, [phone]  or call [phone]

## 2017-10-25 NOTE — ED Triage Notes (Signed)
Attempting to triage patient who refuses to answer question when asking why she is here. PT gathered all of her belongings and walked out to lobby. PT refused to stop and talk with myself and abigail PA

## 2017-10-25 NOTE — ED Notes (Signed)
Pt states she has been diagnosed last month with throat cancer, has not received treatment. Pt reports losing large amount of weight, current weight is 80 lbs, states she has not had a full meal in 4 months, pt spitting up secretions instead of swallowing complaining of throat and ear pain.

## 2017-10-25 NOTE — ED Notes (Signed)
CT notified that pt cannot swallow oral contrast, IV team consulted to place IV appropriate for CT with contrast. Provider notified of delay for CT

## 2017-10-25 NOTE — ED Provider Notes (Signed)
Patient placed in Quick Look pathway, seen and evaluated   Chief Complaint: Sent by doctor,   HPI:    HX throat cancer, sent by Dr Constance Holster.  Heavy smoker, etoh and crack addict.  23mos ago stopped eating and drinking. Seen by Dr. Constance Holster on Tuesday. He is states that he wanted to admit her at that time however she did not want to stay.  She presents the emergency department today for admission.  Dr. Constance Holster states that she has a visible throat cancer both by CT and possibly looking in the back of her throat.  She has had an 80 pound weight loss in the past 3 months.  She was seen at [facility] last week and had to be rehydrated and given potassium because her intake is so poor.  He feels she needs an admission for failure to thrive and G-tube placement to increase her nutrition so that he is she is stable enough to go to the OR for initial biopsy.     ROS:  Unable to review systems- patient will not answer questions  Physical Exam:   Gen: No distress  Neuro: Awake and Alert  Skin: Warm    Focused Exam: Cachectic female appears older than stated age.  We will not allow me to do physical examination at this time.   Initiation of care has begun. The patient has been counseled on the process, plan, and necessity for staying for the completion/evaluation, and the remainder of the medical screening examination    Margarita Mail, PA-C 10/25/17 1408    Tanna Furry, MD 10/27/17 [phone]

## 2017-10-25 NOTE — ED Triage Notes (Signed)
Pt POA states "my doctor told me to come here and tell y'all to call him and he will tell you what to do". PT and POA is unsure why she is here.

## 2017-10-25 NOTE — ED Notes (Signed)
Patient transported to CT 

## 2017-10-25 NOTE — ED Notes (Signed)
Informed dr Leonette Monarch of critical K and pt has been contacted to return to ED.

## 2017-10-25 NOTE — ED Provider Notes (Signed)
Complains of generalized weakness and weight loss for the past 6 months.  On exam she is chronically ill appearing however appears in no acute distress, speaks in paragraphs.  No respiratory distress.  Handling secretions well.   Orlie Dakin, MD 10/25/17 [phone]

## 2017-10-26 LAB — MAGNESIUM: Magnesium: 2.1 mg/dL (ref 1.7–2.4)

## 2017-10-26 LAB — BASIC METABOLIC PANEL
Anion gap: 14 (ref 5–15)
Anion gap: 11 (ref 5–15)
Anion gap: 11 (ref 5–15)
Anion gap: 12 (ref 5–15)
Anion gap: 8 (ref 5–15)
BUN: 10 mg/dL (ref 6–20)
BUN: 13 mg/dL (ref 6–20)
BUN: 13 mg/dL (ref 6–20)
BUN: 8 mg/dL (ref 6–20)
BUN: 9 mg/dL (ref 6–20)
CALCIUM: 7.8 mg/dL — AB (ref 8.9–10.3)
CALCIUM: 7.9 mg/dL — AB (ref 8.9–10.3)
CALCIUM: 8 mg/dL — AB (ref 8.9–10.3)
CO2: 23 mmol/L (ref 22–32)
CO2: 22 mmol/L (ref 22–32)
CO2: 24 mmol/L (ref 22–32)
CO2: 23 mmol/L (ref 22–32)
CO2: 24 mmol/L (ref 22–32)
CREATININE: 0.66 mg/dL (ref 0.44–1.00)
CREATININE: 0.68 mg/dL (ref 0.44–1.00)
CREATININE: 0.76 mg/dL (ref 0.44–1.00)
CREATININE: 0.82 mg/dL (ref 0.44–1.00)
Calcium: 8.1 mg/dL — ABNORMAL LOW (ref 8.9–10.3)
Calcium: 7.9 mg/dL — ABNORMAL LOW (ref 8.9–10.3)
Chloride: 95 mmol/L — ABNORMAL LOW (ref 101–111)
Chloride: 99 mmol/L — ABNORMAL LOW (ref 101–111)
Chloride: 98 mmol/L — ABNORMAL LOW (ref 101–111)
Chloride: 95 mmol/L — ABNORMAL LOW (ref 101–111)
Chloride: 98 mmol/L — ABNORMAL LOW (ref 101–111)
Creatinine, Ser: 0.69 mg/dL (ref 0.44–1.00)
GFR calc Af Amer: >60 mL/min (ref >60)
GFR calc Af Amer: >60 mL/min (ref >60)
GFR calc Af Amer: >60 mL/min (ref >60)
GFR calc Af Amer: >60 mL/min (ref >60)
GFR calc Af Amer: >60 mL/min (ref >60)
GLUCOSE: 117 mg/dL — AB (ref 65–99)
GLUCOSE: 133 mg/dL — AB (ref 65–99)
GLUCOSE: 179 mg/dL — AB (ref 65–99)
GLUCOSE: 75 mg/dL (ref 65–99)
GLUCOSE: 94 mg/dL (ref 65–99)
POTASSIUM: 2.5 mmol/L — AB (ref 3.5–5.1)
POTASSIUM: 2.8 mmol/L — AB (ref 3.5–5.1)
POTASSIUM: 2.9 mmol/L — AB (ref 3.5–5.1)
Potassium: 2.4 mmol/L — CL (ref 3.5–5.1)
Potassium: 2.9 mmol/L — ABNORMAL LOW (ref 3.5–5.1)
SODIUM: 133 mmol/L — AB (ref 135–145)
Sodium: 132 mmol/L — ABNORMAL LOW (ref 135–145)
Sodium: 132 mmol/L — ABNORMAL LOW (ref 135–145)
Sodium: 130 mmol/L — ABNORMAL LOW (ref 135–145)
Sodium: 130 mmol/L — ABNORMAL LOW (ref 135–145)

## 2017-10-26 LAB — COMPREHENSIVE METABOLIC PANEL
ALK PHOS: 46 U/L (ref 38–126)
ALT: 9 U/L — AB (ref 14–54)
AST: 18 U/L (ref 15–41)
Albumin: 2.7 g/dL — ABNORMAL LOW (ref 3.5–5.0)
Anion gap: 12 (ref 5–15)
BUN: 13 mg/dL (ref 6–20)
CALCIUM: 8 mg/dL — AB (ref 8.9–10.3)
CHLORIDE: 99 mmol/L — AB (ref 101–111)
CO2: 23 mmol/L (ref 22–32)
CREATININE: 0.75 mg/dL (ref 0.44–1.00)
Glucose, Bld: 120 mg/dL — ABNORMAL HIGH (ref 65–99)
Potassium: 2.8 mmol/L — ABNORMAL LOW (ref 3.5–5.1)
Sodium: 134 mmol/L — ABNORMAL LOW (ref 135–145)
Total Bilirubin: 0.9 mg/dL (ref 0.3–1.2)
Total Protein: 5.8 g/dL — ABNORMAL LOW (ref 6.5–8.1)

## 2017-10-26 LAB — URINALYSIS, ROUTINE W REFLEX MICROSCOPIC
Bilirubin Urine: NEGATIVE
Glucose, UA: NEGATIVE mg/dL
Ketones, ur: 5 mg/dL — AB
Nitrite: NEGATIVE
Protein, ur: NEGATIVE mg/dL
pH: 7 (ref 5.0–8.0)

## 2017-10-26 LAB — RAPID URINE DRUG SCREEN, HOSP PERFORMED
AMPHETAMINES: NOT DETECTED
Barbiturates: NOT DETECTED
Benzodiazepines: NOT DETECTED
COCAINE: POSITIVE — AB
OPIATES: POSITIVE — AB
Tetrahydrocannabinol: POSITIVE — AB

## 2017-10-26 LAB — TROPONIN I
Troponin I: <0.03 ng/mL (ref <0.03)
Troponin I: <0.03 ng/mL (ref <0.03)
Troponin I: <0.03 ng/mL (ref <0.03)

## 2017-10-26 LAB — CBC
HCT: 40.4 % (ref 36.0–46.0)
Hemoglobin: 13.7 g/dL (ref 12.0–15.0)
MCH: 31.3 pg (ref 26.0–34.0)
MCHC: 33.9 g/dL (ref 30.0–36.0)
MCV: 92.2 fL (ref 78.0–100.0)
PLATELETS: 195 10*3/uL (ref 150–400)
RBC: 4.38 MIL/uL (ref 3.87–5.11)
RDW: 14.1 % (ref 11.5–15.5)
WBC: 6.6 10*3/uL (ref 4.0–10.5)

## 2017-10-26 LAB — PROTIME-INR
INR: 0.98
PROTHROMBIN TIME: 12.9 s (ref 11.4–15.2)

## 2017-10-26 LAB — PHOSPHORUS: Phosphorus: 2.8 mg/dL (ref 2.5–4.6)

## 2017-10-26 LAB — TSH: TSH: 20.49 u[IU]/mL — AB (ref 0.350–4.500)

## 2017-10-26 MED ORDER — SODIUM CHLORIDE 0.9 % IV SOLN
1.0000 g | INTRAVENOUS | Status: DC
  Administered 2017-10-27 – 2017-10-29 (×3): 1 g via INTRAVENOUS
  Filled 2017-10-26 (×4): qty 10

## 2017-10-26 MED ORDER — POTASSIUM CHLORIDE 10 MEQ/50ML IV SOLN
10.0000 meq | INTRAVENOUS | Status: AC
  Administered 2017-10-26: 10 meq via INTRAVENOUS
  Filled 2017-10-26 (×2): qty 50

## 2017-10-26 MED ORDER — NICOTINE 21 MG/24HR TD PT24
21.0000 mg | MEDICATED_PATCH | Freq: Every day | TRANSDERMAL | Status: DC
  Administered 2017-10-26 – 2017-10-31 (×6): 21 mg via TRANSDERMAL
  Filled 2017-10-26 (×7): qty 1

## 2017-10-26 MED ORDER — KCL IN DEXTROSE-NACL 40-5-0.9 MEQ/L-%-% IV SOLN
INTRAVENOUS | Status: DC
  Administered 2017-10-26 – 2017-10-30 (×4): via INTRAVENOUS
  Filled 2017-10-26 (×6): qty 1000

## 2017-10-26 NOTE — ED Notes (Signed)
Pt has drank water, pt cursing and stated, "fuck this mess I'm drinking"

## 2017-10-26 NOTE — ED Notes (Signed)
Spoke with dr osie-bonsu-- regarding pt's request to eat-- orders received

## 2017-10-26 NOTE — Progress Notes (Signed)
PROGRESS NOTE    Cynthia Church  KXF:[id number] DOB: Mar 30, 1966 DOA: 10/25/2017 PCP: Patient, No Pcp Per  [facility] Dr. Constance Holster, ENT    Brief Narrative:   Cynthia Church is a 52 year old lady with medical history significant for but not limited to COPD and abdominal neck CT concerning for head and neck cancer, alcoholism and noncompliance, presenting with progressive dysphagia and odynophagia to the point of not being able to eat.    Patient had been seen by ear nose and throat specialist Dr. Constance Holster few days ago at which time admission was recommended but she refused.  She was noted to be severely hypokalemic and admitted for further management after initial ER resuscitation.      Assessment & Plan:   Active Problems:   Tobacco abuse   Bipolar disorder (HCC)   Chronic pain syndrome   Hypokalemia   Abnormal CT scan, neck   Chest pain   Right ear pain  #1 severe hypokalemia: Poor oral intake IV  Repletion Check magnesium level Follow renal function with electrolytes #2  #2 dysphasia/dysphagia: Due to head and neck cancer IR consulted for feeding tube Patient started drinking in the ED despite being n.p.o. She denies any problems of aspiration, and notes that the only problem is that she cannot keep food down and sometimes it hurts to eat insisted on eating-n.p.o. discontinued until Sunday night in preparation for feeding tube placement on Monday  #3 head and neck cancer: ENT consult to see me on Monday PT/OT eval prior to discharge  #4 history of chronic pain/substance abuse/tobacco abuse: Supportive care Social work consult  #5 UTI Antibiotic Follow-up urine culture  #6 protein calorie malnutrition: Due to poor oral intake with head and neck cancer Feeding tube on Monday Nutrition consult follow-up  DVT prophylaxis: SCD's Code Status: (Full) Family Communication: Disposition Plan: To be determined may need placement-  Consultants:     Procedures: Antimicrobials: Rocephin  Subjective: Patient wants to eat, and had actually been drinking even though she had been n.p.o. early.  Denies any fever or chills. Objective: Vitals:   10/25/17 2305 10/26/17 0114 10/26/17 0200 10/26/17 0622  BP: (!) 90/57 (!) 140/96 104/83 (!) 127/95  Pulse: 72 70 69 71  Resp: 15 (!) 9 14 14   Temp:      TempSrc:      SpO2: 99% 99% 99% 96%  Weight:      Height:        Intake/Output Summary (Last 24 hours) at 10/26/2017 0959 Last data filed at 10/26/2017 0130 Gross per 24 hour  Intake 100 ml  Output -  Net 100 ml   Filed Weights   10/25/17 1748  Weight: 36.6 kg (80 lb 9.6 oz)    Examination:  General exam: No acute distress, chronically ill looking  Respiratory system: Mild diminished breath sounds otherwise unremarkable Cardiovascular system: S1 & S2 heard, RRR. No JVD, murmurs, rubs, gallops or clicks. No pedal edema. Gastrointestinal system: Abdomen is nondistended, soft and nontender. No organomegaly or masses felt. Normal bowel sounds heard. Central nervous system: Alert and oriented. No focal neurological deficits. Extremities: Symmetric 5 x 5 power. Skin: No rashes, lesions or ulcers Psychiatry: Judgement and insight appear normal. Mood & affect appropriate.     Data Reviewed: I have personally reviewed following labs and imaging studies  CBC: Recent Labs  Lab 10/25/17 1404 10/26/17 0450  WBC 8.8 6.6  NEUTROABS 6.6  --   HGB 16.7* 13.7  HCT 46.0 40.4  MCV 91.3 92.2  PLT 234 350   Basic Metabolic Panel: Recent Labs  Lab 10/25/17 1404 10/25/17 1750 10/25/17 2356 10/26/17 0256 10/26/17 0450  NA 132*  --  132* 132* 134*  K 2.3*  --  2.4* 2.9* 2.8*  CL 89*  --  95* 99* 99*  CO2 26  --  23 22 23   GLUCOSE 100*  --  75 94 120*  BUN 16  --  13 13 13   CREATININE 0.98  --  0.82 0.76 0.75  CALCIUM 9.4  --  7.9* 7.8* 8.0*  MG  --  1.8  --   --  2.1  PHOS  --  3.0  --   --  2.8   GFR: Estimated Creatinine  Clearance: 48.6 mL/min (by C-G formula based on SCr of 0.75 mg/dL). Liver Function Tests: Recent Labs  Lab 10/25/17 1404 10/26/17 0450  AST 24 18  ALT 13* 9*  ALKPHOS 61 46  BILITOT 1.3* 0.9  PROT 7.8 5.8*  ALBUMIN 3.6 2.7*   No results for input(s): LIPASE, AMYLASE in the last 168 hours. No results for input(s): AMMONIA in the last 168 hours. Coagulation Profile: No results for input(s): INR, PROTIME in the last 168 hours. Cardiac Enzymes: Recent Labs  Lab 10/25/17 2356 10/26/17 0450  TROPONINI <0.03 <0.03   BNP (last 3 results) No results for input(s): PROBNP in the last 8760 hours. HbA1C: No results for input(s): HGBA1C in the last 72 hours. CBG: No results for input(s): GLUCAP in the last 168 hours. Lipid Profile: No results for input(s): CHOL, HDL, LDLCALC, TRIG, CHOLHDL, LDLDIRECT in the last 72 hours. Thyroid Function Tests: Recent Labs    10/26/17 0450  TSH 20.490*   Anemia Panel: No results for input(s): VITAMINB12, FOLATE, FERRITIN, TIBC, IRON, RETICCTPCT in the last 72 hours. Urine analysis:    Component Value Date/Time   COLORURINE YELLOW 10/26/2017 0151   APPEARANCEUR HAZY (A) 10/26/2017 0151   LABSPEC >1.046 (H) 10/26/2017 0151   PHURINE 7.0 10/26/2017 0151   GLUCOSEU NEGATIVE 10/26/2017 0151   HGBUR SMALL (A) 10/26/2017 0151   BILIRUBINUR NEGATIVE 10/26/2017 0151   KETONESUR 5 (A) 10/26/2017 0151   PROTEINUR NEGATIVE 10/26/2017 0151   UROBILINOGEN 0.2 06/01/2015 1329   NITRITE NEGATIVE 10/26/2017 0151   LEUKOCYTESUR MODERATE (A) 10/26/2017 0151   Sepsis Labs: @LABRCNTIP (procalcitonin:4,lacticidven:4)  )No results found for this or any previous visit (from the past 240 hour(s)).       Radiology Studies: Ct Chest W Contrast  Result Date: 10/25/2017 CLINICAL DATA:  Acute onset of generalized chest and epigastric pain. Recent weight loss. Current history of head and neck malignancy. EXAM: CT CHEST, ABDOMEN, AND PELVIS WITH CONTRAST  TECHNIQUE: Multidetector CT imaging of the chest, abdomen and pelvis was performed following the standard protocol during bolus administration of intravenous contrast. CONTRAST:  160mL ISOVUE-300 IOPAMIDOL (ISOVUE-300) INJECTION 61% COMPARISON:  Chest radiograph performed 01/23/2017, and CT of the abdomen and pelvis performed 03/05/2016 FINDINGS: CT CHEST FINDINGS Cardiovascular: The heart is normal in size. The thoracic aorta is unremarkable. The great vessels are within normal limits. Mediastinum/Nodes: The mediastinum is unremarkable in appearance. No mediastinal lymphadenopathy is seen. The thyroid gland is grossly unremarkable in appearance. No axillary lymphadenopathy is seen. Lungs/Pleura: A tiny peripheral opacity at the right middle lobe is thought to reflect atelectasis. A calcified granuloma is noted at the left lung base. No focal consolidation, pleural effusion or pneumothorax is seen. No dominant mass is identified. Musculoskeletal: No acute  osseous abnormalities are identified. The visualized musculature is unremarkable in appearance. Enhancement anterior to the trachea at the level of the neck is thought to reflect prior procedure in this region. CT ABDOMEN PELVIS FINDINGS Hepatobiliary: The liver is unremarkable in appearance. The gallbladder is unremarkable in appearance. The common bile duct remains normal in caliber. Pancreas: The pancreas is within normal limits. Spleen: The spleen is unremarkable in appearance. Adrenals/Urinary Tract: The adrenal glands are unremarkable in appearance. The kidneys are within normal limits. There is no evidence of hydronephrosis. No renal or ureteral stones are identified. No perinephric stranding is seen. Stomach/Bowel: The stomach is unremarkable in appearance. The small bowel is within normal limits. The appendix is not visualized; there is no evidence for appendicitis. The colon is unremarkable in appearance. Mild soft tissue inflammation is noted along the  left paracolic gutter, of uncertain significance. This could reflect an omental infarct. Vascular/Lymphatic: Scattered calcification is seen along the abdominal aorta and its branches. The abdominal aorta is otherwise grossly unremarkable. The inferior vena cava is grossly unremarkable. No retroperitoneal lymphadenopathy is seen. No pelvic sidewall lymphadenopathy is identified. Reproductive: The bladder is mildly distended and grossly unremarkable. The uterus is grossly unremarkable. No suspicious adnexal masses are seen. Other: No additional soft tissue abnormalities are seen. Musculoskeletal: No acute osseous abnormalities are identified. The visualized musculature is unremarkable in appearance. IMPRESSION: 1. Mild soft tissue inflammation along the left paracolic gutter, of uncertain significance. This could reflect an omental infarct. 2. No evidence of metastatic disease to the chest, abdomen or pelvis. Aortic Atherosclerosis (ICD10-I70.0). Electronically Signed   By: Garald Balding M.D.   On: 10/25/2017 22:47   Ct Abdomen Pelvis W Contrast  Result Date: 10/25/2017 CLINICAL DATA:  Acute onset of generalized chest and epigastric pain. Recent weight loss. Current history of head and neck malignancy. EXAM: CT CHEST, ABDOMEN, AND PELVIS WITH CONTRAST TECHNIQUE: Multidetector CT imaging of the chest, abdomen and pelvis was performed following the standard protocol during bolus administration of intravenous contrast. CONTRAST:  118mL ISOVUE-300 IOPAMIDOL (ISOVUE-300) INJECTION 61% COMPARISON:  Chest radiograph performed 01/23/2017, and CT of the abdomen and pelvis performed 03/05/2016 FINDINGS: CT CHEST FINDINGS Cardiovascular: The heart is normal in size. The thoracic aorta is unremarkable. The great vessels are within normal limits. Mediastinum/Nodes: The mediastinum is unremarkable in appearance. No mediastinal lymphadenopathy is seen. The thyroid gland is grossly unremarkable in appearance. No axillary  lymphadenopathy is seen. Lungs/Pleura: A tiny peripheral opacity at the right middle lobe is thought to reflect atelectasis. A calcified granuloma is noted at the left lung base. No focal consolidation, pleural effusion or pneumothorax is seen. No dominant mass is identified. Musculoskeletal: No acute osseous abnormalities are identified. The visualized musculature is unremarkable in appearance. Enhancement anterior to the trachea at the level of the neck is thought to reflect prior procedure in this region. CT ABDOMEN PELVIS FINDINGS Hepatobiliary: The liver is unremarkable in appearance. The gallbladder is unremarkable in appearance. The common bile duct remains normal in caliber. Pancreas: The pancreas is within normal limits. Spleen: The spleen is unremarkable in appearance. Adrenals/Urinary Tract: The adrenal glands are unremarkable in appearance. The kidneys are within normal limits. There is no evidence of hydronephrosis. No renal or ureteral stones are identified. No perinephric stranding is seen. Stomach/Bowel: The stomach is unremarkable in appearance. The small bowel is within normal limits. The appendix is not visualized; there is no evidence for appendicitis. The colon is unremarkable in appearance. Mild soft tissue inflammation is noted along  the left paracolic gutter, of uncertain significance. This could reflect an omental infarct. Vascular/Lymphatic: Scattered calcification is seen along the abdominal aorta and its branches. The abdominal aorta is otherwise grossly unremarkable. The inferior vena cava is grossly unremarkable. No retroperitoneal lymphadenopathy is seen. No pelvic sidewall lymphadenopathy is identified. Reproductive: The bladder is mildly distended and grossly unremarkable. The uterus is grossly unremarkable. No suspicious adnexal masses are seen. Other: No additional soft tissue abnormalities are seen. Musculoskeletal: No acute osseous abnormalities are identified. The visualized  musculature is unremarkable in appearance. IMPRESSION: 1. Mild soft tissue inflammation along the left paracolic gutter, of uncertain significance. This could reflect an omental infarct. 2. No evidence of metastatic disease to the chest, abdomen or pelvis. Aortic Atherosclerosis (ICD10-I70.0). Electronically Signed   By: Garald Balding M.D.   On: 10/25/2017 22:47        Scheduled Meds: . folic acid  1 mg Intravenous Daily  . potassium chloride  40 mEq Oral Once  . thiamine  100 mg Intravenous Daily   Continuous Infusions:   LOS: 1 day    Time spent: 31 minutes    Cynthia Church,Cynthia Sagen, MD [facility] Pager [phone]  If 7PM-7AM, please contact night-coverage [email or web address] Password [email or web address] 10/26/2017, 9:59 AM

## 2017-10-26 NOTE — ED Notes (Signed)
Pt. Resting after given Morphine 4 mg.

## 2017-10-26 NOTE — Progress Notes (Signed)
Pharmacy Antibiotic Note  Cynthia Church is a 52 y.o. female admitted on 10/25/2017 with UTI.  Pharmacy has been consulted for Ceftriaxone dosing. WBC is within normal limits. Afebrile. No antibiotic current doses have been given. Urine culture has been sent.  Plan: Ceftriaxone 1g IV every 24 hours.  No renal adjustment needed- pharmacy will sign off.   Height: 5\' 4"  (162.6 cm) Weight: 80 lb 9.6 oz (36.6 kg) IBW/kg (Calculated) : 54.7  Temp (24hrs), Avg:98.4 F (36.9 C), Min:98.4 F (36.9 C), Max:98.4 F (36.9 C)  Recent Labs  Lab 10/25/17 1404 10/25/17 2356 10/26/17 0256 10/26/17 0450  WBC 8.8  --   --  6.6  CREATININE 0.98 0.82 0.76 0.75    Estimated Creatinine Clearance: 48.6 mL/min (by C-G formula based on SCr of 0.75 mg/dL).    No Known Allergies  Antimicrobials this admission: Ceftriaxone 3/16 >>  Dose adjustments this admission:   Microbiology results: 3/16 UCx:    Thank you for allowing pharmacy to be a part of this patient's care.  Sloan Leiter, PharmD, BCPS, BCCCP Clinical Pharmacist Clinical phone 10/26/2017 until 3:[phone] After hours, please call #[phone] 10/26/2017 10:36 AM

## 2017-10-26 NOTE — Clinical Social Work Note (Signed)
Clinical Social Work Assessment  Patient Details  Name: Cynthia Church MRN: [id number] Date of Birth: March 20, 1966  Date of referral:  10/26/17               Reason for consult:  Substance Use/ETOH Abuse                Permission sought to share information with:  Family Supports Permission granted to share information::  Yes, Verbal Permission Granted  Name::     [name]  Agency::  POA  Relationship::  POA  Contact Information:  [name] [phone]  Housing/Transportation Living arrangements for the past 2 months:  Single Family Home(with Paul ) Source of Information:  Patient Patient Interpreter Needed:  None Criminal Activity/Legal Involvement Pertinent to Current Situation/Hospitalization:  No - Comment as needed Significant Relationships:  Friend(pt's POA per pt report. ) Lives with:  Friends Do you feel safe going back to the place where you live?  Yes Need for family participation in patient care:  Yes (Comment)  Care giving concerns: CSW spoke with pt at bedside. During this time CSW was informd that pt is concerned about getting a feed tube and being able to eat. Per pt, pt has a tumor and is needing to get this removed and is afraid of this as well.    Social Worker assessment / plan:  CSW spoke with pt at bedside for this asssesmsnet. During this assessment pt appeared to be falling asleep while attenptign to speak with CSW. Pt reported that pt has a POA Eddie Dibbles) and that pt has been living with Eddie Dibbles for over 20 years. Pt reported to CSW that pt's drug of choice is crack as well as ETOH. Pt expressed that pt does not feel the need for resources for substance use at this time.  Employment status:  Other (Comment)(unknown. ) Insurance information:  Medicare PT Recommendations:  Not assessed at this time Information / Referral to community resources:  Outpatient Substance Abuse Treatment Options, Residential Substance Abuse Treatment Options(CSW spoke with pt about  resoruces adn pt expressed that pt doesnt wish to have any resources at this time. )  Patient/Family's Response to care:  Pt appeared to be understating of plan of care at this time. Pt did not seek any further details from [place] regarding plan of care.   Patient/Family's Understanding of and Emotional Response to Diagnosis, Current Treatment, and Prognosis:  At this time no further questions or concerns have been presented to CSW. PT's emotional response to care was accepting and agreeable to plan at this time.   Emotional Assessment Appearance:  Appears older than stated age Attitude/Demeanor/Rapport:  Avoidant Affect (typically observed):  Appropriate Orientation:  Oriented to Self, Oriented to Place, Oriented to  Time, Oriented to Situation Alcohol / Substance use:  Illicit Drugs, Alcohol Use(crack ) Psych involvement (Current and /or in the community):  No (Comment)(not at this time. )  Discharge Needs  Concerns to be addressed:  Substance Abuse Concerns, Lack of Support Readmission within the last 30 days:  No Current discharge risk:  Substance Abuse Barriers to Discharge:  Continued Medical Work up   [employer], [place] 10/26/2017, 9:01 AM

## 2017-10-26 NOTE — Evaluation (Signed)
Physical Therapy Evaluation Patient Details Name: Cynthia Church MRN: [id number] DOB: 12-11-1965 Today's Date: 10/26/2017   History of Present Illness  Patient presents to the hospital with difficulty swallowing. PMH: bipolar; cancer, alcoholism, Low back pai, COPD   Clinical Impression  Patient appears to be at baseline mobility. She was able to walk to the bathroom and back with no loss of balance. She reported no significant increase in fatigue. She can likely ambulate household distances safely. She has no need for further skilled therapy.      Follow Up Recommendations No PT follow up    Equipment Recommendations       Recommendations for Other Services       Precautions / Restrictions Precautions Precautions: None Restrictions Weight Bearing Restrictions: No      Mobility  Bed Mobility Overal bed mobility: Independent             General bed mobility comments: Needed no assitance getting out of bed   Transfers Overall transfer level: Needs assistance   Transfers: Sit to/from Stand Sit to Stand: Supervision         General transfer comment: for inital balance. Once up no assit needed   Ambulation/Gait Ambulation/Gait assistance: Independent Ambulation Distance (Feet): 50 Feet Assistive device: None Gait Pattern/deviations: WFL(Within Functional Limits)     General Gait Details: Patient ambualted to the bathroom and back without syncope. She needed assistance for wire control   Stairs            Wheelchair Mobility    Modified Rankin (Stroke Patients Only)       Balance                                             Pertinent Vitals/Pain Pain Assessment: Faces Pain Score: 2  Faces Pain Scale: Hurts a little bit Pain Location: " her joints" Pain Descriptors / Indicators: Aching Pain Intervention(s): Monitored during session;Premedicated before session    Home Living Family/patient expects to be discharged to::  Private residence Living Arrangements: Other (Comment) Available Help at Discharge: Friend(s) Type of Home: House Home Access: Level entry              Prior Function Level of Independence: Independent               Hand Dominance   Dominant Hand: Right    Extremity/Trunk Assessment   Upper Extremity Assessment Upper Extremity Assessment: Overall WFL for tasks assessed    Lower Extremity Assessment Lower Extremity Assessment: Overall WFL for tasks assessed       Communication   Communication: No difficulties  Cognition Arousal/Alertness: Awake/alert Behavior During Therapy: WFL for tasks assessed/performed Overall Cognitive Status: Within Functional Limits for tasks assessed                                        General Comments      Exercises     Assessment/Plan    PT Assessment Patent does not need any further PT services  PT Problem List         PT Treatment Interventions      PT Goals (Current goals can be found in the Care Plan section)  Acute Rehab PT Goals Patient Stated Goal: to go home  PT Goal  Formulation: With patient Time For Goal Achievement: 11/02/17 Potential to Achieve Goals: Good    Frequency     Barriers to discharge        Co-evaluation               AM-PAC PT "6 Clicks" Daily Activity  Outcome Measure Difficulty turning over in bed (including adjusting bedclothes, sheets and blankets)?: None Difficulty moving from lying on back to sitting on the side of the bed? : None Difficulty sitting down on and standing up from a chair with arms (e.g., wheelchair, bedside commode, etc,.)?: None Help needed moving to and from a bed to chair (including a wheelchair)?: None Help needed walking in hospital room?: None Help needed climbing 3-5 steps with a railing? : None 6 Click Score: 24    End of Session Equipment Utilized During Treatment: Gait belt Activity Tolerance: Patient tolerated treatment  well Patient left: in bed;with call bell/phone within reach Nurse Communication: Mobility status PT Visit Diagnosis: Unsteadiness on feet (R26.81)    Time: 6789-3810 PT Time Calculation (min) (ACUTE ONLY): 16 min   Charges:   PT Evaluation $PT Eval Moderate Complexity: 1 Mod     PT G Codes:        Carney Living PT DPT  10/26/2017, 10:57 AM

## 2017-10-26 NOTE — ED Notes (Signed)
Critical Potassium of 2.5 result called from lab

## 2017-10-26 NOTE — Progress Notes (Signed)
Pt arrived to floor via Hospital bed.  Alert and Oriented X4.  VS are stable.

## 2017-10-26 NOTE — ED Notes (Signed)
Pt. Requesting to be unhooked. Pt came to Ice machine to get ice , had to have her to get to her room.

## 2017-10-26 NOTE — ED Notes (Signed)
Patient requested that all side rails be "put up" so that she doesn't "fall out of the bed" after the morphine.

## 2017-10-26 NOTE — Consult Note (Signed)
Chief Complaint: Patient was seen in consultation today for percutaneous gastric tube placement at the request of Dr Vista Lawman  Supervising Physician: Daryll Brod  Patient Status: [facility] - In-pt  History of Present Illness: Cynthia Church is a 52 y.o. 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 Severe airway effacement at level of hypopharynx. Probable infiltrative invasion of right parapharyngeal, prevertebral, and carotid spaces at approximately C2-C5 levels. Necrotic right retropharyngeal, level 2B, and level 3 metastatic lymphadenopathy. 2-3 mm pulmonary nodules in upper lobes bilaterally.  Progressive dysphagia ETOH use; Bipolar  Chronic pain syndrome Malnutrition Request for percutaneous gastric tube placement Dr Annamaria Boots has reviewed imaging and approves procedure May need balloon retention G tube  Past Medical History:  Diagnosis Date  . Alcoholism ([place])   . Bipolar affective disorder, manic ([place]) 1993   Dx'd at [facility]  . Cancer ([place]) 1989   uterine  . Chronic pain   . COPD (chronic obstructive pulmonary disease) ([place])   . Headache(784.0) 09/25/2011  . Hyperthyroidism 1993  . Low back pain   . Mental disorder   . Personality disorders 1993    Past Surgical History:  Procedure Laterality Date  . BACK SURGERY    . BREAST SURGERY  2003   to check for possible cancer  . cauterization of uterine cancer  2008  . tubal ligaation      Allergies: Patient has no known allergies.  Medications: Prior to Admission medications   Medication Sig Start Date End Date Taking? Authorizing Provider  diphenhydrAMINE (BENADRYL) 25 MG tablet Take 1 tablet (25 mg total) by mouth every 6 (six) hours. Patient not taking: Reported on  10/25/2017 01/02/17   Jola Schmidt, MD  famotidine (PEPCID) 20 MG tablet Take 1 tablet (20 mg total) by mouth 2 (two) times daily. Patient not taking: Reported on 10/25/2017 01/02/17   Jola Schmidt, MD  ibuprofen (ADVIL,MOTRIN) 800 MG tablet Take 1 tablet (800 mg total) by mouth every 8 (eight) hours as needed for moderate pain. Patient not taking: Reported on 10/25/2017 01/23/17   Milton Ferguson, MD  levofloxacin (LEVAQUIN) 500 MG tablet Take 1 tablet (500 mg total) by mouth daily. Patient not taking: Reported on 10/25/2017 01/02/17   Jola Schmidt, MD  metoprolol tartrate (LOPRESSOR) 25 MG tablet Take 1 tablet (25 mg total) by mouth 2 (two) times daily. Patient not taking: Reported on 10/25/2017 07/10/16   Kerrie Buffalo, NP  predniSONE (DELTASONE) 10 MG tablet Take 6 tablets (60 mg total) by mouth daily. Patient not taking: Reported on 10/25/2017 01/02/17   Jola Schmidt, MD  traZODone (DESYREL) 50 MG tablet Take 1 tablet (50 mg total) by mouth at bedtime as needed for sleep. Patient not taking: Reported on 08/12/2016 07/10/16   Kerrie Buffalo, NP     Family History  Problem Relation Age of Onset  . Cirrhosis Father     Social History   Socioeconomic History  . Marital status: Divorced    Spouse name: None  . Number of children: None  . Years of education: None  . Highest education level: None  Social Needs  . Financial resource strain: None  . Food insecurity - worry: None  . Food insecurity - inability: None  . Transportation needs - medical: None  . Transportation needs - non-medical: None  Occupational History  . None  Tobacco Use  . Smoking status: Current Every Day Smoker    Packs/day: 3.00    Years: 28.00    Pack years: 84.00    Types: Cigarettes  . Smokeless tobacco: Never Used  Substance and Sexual Activity  . Alcohol use: Yes    Comment: daily  . Drug use: Yes    Types: Marijuana, Cocaine    Comment: yesterday  . Sexual activity: No  Other Topics Concern  .  None  Social History Narrative  . None    Review of Systems: A 12 point ROS discussed and pertinent positives are indicated in the HPI above.  All other systems are negative.  Review of Systems  Constitutional: Positive for activity change, appetite change, fatigue and unexpected weight change. Negative for fever.  Respiratory: Positive for choking. Negative for cough and shortness of breath.   Neurological: Positive for weakness.  Psychiatric/Behavioral: Positive for agitation and behavioral problems. Negative for confusion.    Vital Signs: BP (!) 127/95   Pulse 71   Temp 98.4 F (36.9 C) (Oral)   Resp 14   Ht 5\' 4"  (1.626 m)   Wt 80 lb 9.6 oz (36.6 kg)   SpO2 96%   BMI 13.83 kg/m   Physical Exam  Constitutional: She is oriented to person, place, and time.  Cardiovascular: Normal rate and regular rhythm.  Pulmonary/Chest: Effort normal and breath sounds normal.  Abdominal: Soft.  Musculoskeletal: Normal range of motion.  Neurological: She is alert and oriented to person, place, and time.  Skin: Skin is warm and dry.  Psychiatric: She has a normal mood and affect. Her behavior is normal. Judgment and thought content normal.  Nursing note and vitals reviewed.   Imaging: Dg Neck Soft Tissue  Result Date: 10/08/2017 CLINICAL DATA:  52 y/o  F; 6 months of choking sensation. EXAM: NECK SOFT TISSUES - 1+ VIEW COMPARISON:  03/05/2016 CT cervical spine FINDINGS: Airway effacement in vallecula and hypopharynx may represent debris, mucosal lesion, or mucosal thickening. Moderate cervical spondylosis with large anterior marginal osteophytes at the C5-6 levels. No radiopaque foreign body identified. IMPRESSION: Airway effacement in vallecula and hypopharynx may represent debris, mucosal lesion, or mucosal thickening. Direct visualization recommended. Electronically Signed   By: Kristine Garbe M.D.   On: 10/08/2017 23:26   Ct Soft Tissue Neck W Contrast  Result Date:  10/09/2017 CLINICAL DATA:  52 y/o F; 6 months of sore throat. Abnormal radiographs. EXAM: CT NECK WITH CONTRAST TECHNIQUE: Multidetector CT imaging of the neck was performed using the standard protocol following the bolus administration of intravenous contrast. CONTRAST:  79mL ISOVUE-300 IOPAMIDOL (ISOVUE-300) INJECTION 61% COMPARISON:  03/05/2016 CT cervical spine. 10/08/2017 cervical spine radiographs. FINDINGS: Pharynx and larynx: Diffuse irregular an infiltrative masslike enhancement involving nasopharynx, oropharynx, base of tongue, epiglottis, hypopharynx, supraglottic space, and glottis. In the nasopharynx there is mucosal enhancement along the right lateral and posterior walls of the nasopharynx including fossa of Rosenmuller and there is a 10 mm necrotic right node of Rouviere and right retropharyngeal lymph node at C1 level (series 2, image 22, 28). In the oropharynx mucosal enhancement involves the posterior pharyngeal wall crossing the midline, right lateral pharyngeal wall, retromolar trigone connecting right soft palate to base of tongue via the pterygomandibular raphe, bilateral base of tongue with up to 17 mm invasion on the right (series 2, image 42), and to lesser degree the left lateral oropharyngeal wall. Ill-defined enhancement effacing right prevertebral space, parapharyngeal space, carotid space likely representing local soft  tissue invasion from approximately C2-C5 levels. In the hypopharynx there is irregular mucosal thickening of the epiglottis, and bulky masslike enhancement of the mucosal surfaces circumferentially with ulcerations resulting in severe airway effacement. Laryngeal involvement involves true and false cords, anterior commissure, and paraglottic fat. No definite thyroid cartilage invasion or extra laryngeal extension. Salivary glands: No definite invasion. Thyroid: Normal. Lymph nodes: Necrotic 10 mm right level 3 and 14 x 13 mm necrotic right level 2 B lymphadenopathy  (series 2, image 41 and 52). Vascular: Negative. Limited intracranial: Negative. Visualized orbits: Negative. Mastoids and visualized paranasal sinuses: Clear. Skeleton: No acute or aggressive process. Upper chest: Several 2-3 mm nodules within the upper lobes bilaterally. Other: None. 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 Severe airway effacement at level of hypopharynx. Probable infiltrative invasion of right parapharyngeal, prevertebral, and carotid spaces at approximately C2-C5 levels. Necrotic right retropharyngeal, level 2B, and level 3 metastatic lymphadenopathy. 2-3 mm pulmonary nodules in upper lobes bilaterally. These results were called by telephone at the time of interpretation on 10/09/2017 at 12:37 am to Dr. Nanda Quinton , who verbally acknowledged these results. Electronically Signed   By: Kristine Garbe M.D.   On: 10/09/2017 00:43   Ct Chest W Contrast  Result Date: 10/25/2017 CLINICAL DATA:  Acute onset of generalized chest and epigastric pain. Recent weight loss. Current history of head and neck malignancy. EXAM: CT CHEST, ABDOMEN, AND PELVIS WITH CONTRAST TECHNIQUE: Multidetector CT imaging of the chest, abdomen and pelvis was performed following the standard protocol during bolus administration of intravenous contrast. CONTRAST:  176mL ISOVUE-300 IOPAMIDOL (ISOVUE-300) INJECTION 61% COMPARISON:  Chest radiograph performed 01/23/2017, and CT of the abdomen and pelvis performed 03/05/2016 FINDINGS: CT CHEST FINDINGS Cardiovascular: The heart is normal in size. The thoracic aorta is unremarkable. The great vessels are within normal limits. Mediastinum/Nodes: The mediastinum is unremarkable in appearance. No mediastinal lymphadenopathy is seen. The thyroid gland is grossly unremarkable in  appearance. No axillary lymphadenopathy is seen. Lungs/Pleura: A tiny peripheral opacity at the right middle lobe is thought to reflect atelectasis. A calcified granuloma is noted at the left lung base. No focal consolidation, pleural effusion or pneumothorax is seen. No dominant mass is identified. Musculoskeletal: No acute osseous abnormalities are identified. The visualized musculature is unremarkable in appearance. Enhancement anterior to the trachea at the level of the neck is thought to reflect prior procedure in this region. CT ABDOMEN PELVIS FINDINGS Hepatobiliary: The liver is unremarkable in appearance. The gallbladder is unremarkable in appearance. The common bile duct remains normal in caliber. Pancreas: The pancreas is within normal limits. Spleen: The spleen is unremarkable in appearance. Adrenals/Urinary Tract: The adrenal glands are unremarkable in appearance. The kidneys are within normal limits. There is no evidence of hydronephrosis. No renal or ureteral stones are identified. No perinephric stranding is seen. Stomach/Bowel: The stomach is unremarkable in appearance. The small bowel is within normal limits. The appendix is not visualized; there is no evidence for appendicitis. The colon is unremarkable in appearance. Mild soft tissue inflammation is noted along the left paracolic gutter, of uncertain significance. This could reflect an omental infarct. Vascular/Lymphatic: Scattered calcification is seen along the abdominal aorta and its branches. The abdominal aorta is otherwise grossly unremarkable. The inferior vena cava is grossly unremarkable. No retroperitoneal lymphadenopathy is seen. No pelvic sidewall lymphadenopathy is identified. Reproductive: The bladder is mildly distended and grossly  unremarkable. The uterus is grossly unremarkable. No suspicious adnexal masses are seen. Other: No additional soft tissue abnormalities are seen. Musculoskeletal: No acute osseous abnormalities are  identified. The visualized musculature is unremarkable in appearance. IMPRESSION: 1. Mild soft tissue inflammation along the left paracolic gutter, of uncertain significance. This could reflect an omental infarct. 2. No evidence of metastatic disease to the chest, abdomen or pelvis. Aortic Atherosclerosis (ICD10-I70.0). Electronically Signed   By: Garald Balding M.D.   On: 10/25/2017 22:47   Ct Abdomen Pelvis W Contrast  Result Date: 10/25/2017 CLINICAL DATA:  Acute onset of generalized chest and epigastric pain. Recent weight loss. Current history of head and neck malignancy. EXAM: CT CHEST, ABDOMEN, AND PELVIS WITH CONTRAST TECHNIQUE: Multidetector CT imaging of the chest, abdomen and pelvis was performed following the standard protocol during bolus administration of intravenous contrast. CONTRAST:  117mL ISOVUE-300 IOPAMIDOL (ISOVUE-300) INJECTION 61% COMPARISON:  Chest radiograph performed 01/23/2017, and CT of the abdomen and pelvis performed 03/05/2016 FINDINGS: CT CHEST FINDINGS Cardiovascular: The heart is normal in size. The thoracic aorta is unremarkable. The great vessels are within normal limits. Mediastinum/Nodes: The mediastinum is unremarkable in appearance. No mediastinal lymphadenopathy is seen. The thyroid gland is grossly unremarkable in appearance. No axillary lymphadenopathy is seen. Lungs/Pleura: A tiny peripheral opacity at the right middle lobe is thought to reflect atelectasis. A calcified granuloma is noted at the left lung base. No focal consolidation, pleural effusion or pneumothorax is seen. No dominant mass is identified. Musculoskeletal: No acute osseous abnormalities are identified. The visualized musculature is unremarkable in appearance. Enhancement anterior to the trachea at the level of the neck is thought to reflect prior procedure in this region. CT ABDOMEN PELVIS FINDINGS Hepatobiliary: The liver is unremarkable in appearance. The gallbladder is unremarkable in appearance.  The common bile duct remains normal in caliber. Pancreas: The pancreas is within normal limits. Spleen: The spleen is unremarkable in appearance. Adrenals/Urinary Tract: The adrenal glands are unremarkable in appearance. The kidneys are within normal limits. There is no evidence of hydronephrosis. No renal or ureteral stones are identified. No perinephric stranding is seen. Stomach/Bowel: The stomach is unremarkable in appearance. The small bowel is within normal limits. The appendix is not visualized; there is no evidence for appendicitis. The colon is unremarkable in appearance. Mild soft tissue inflammation is noted along the left paracolic gutter, of uncertain significance. This could reflect an omental infarct. Vascular/Lymphatic: Scattered calcification is seen along the abdominal aorta and its branches. The abdominal aorta is otherwise grossly unremarkable. The inferior vena cava is grossly unremarkable. No retroperitoneal lymphadenopathy is seen. No pelvic sidewall lymphadenopathy is identified. Reproductive: The bladder is mildly distended and grossly unremarkable. The uterus is grossly unremarkable. No suspicious adnexal masses are seen. Other: No additional soft tissue abnormalities are seen. Musculoskeletal: No acute osseous abnormalities are identified. The visualized musculature is unremarkable in appearance. IMPRESSION: 1. Mild soft tissue inflammation along the left paracolic gutter, of uncertain significance. This could reflect an omental infarct. 2. No evidence of metastatic disease to the chest, abdomen or pelvis. Aortic Atherosclerosis (ICD10-I70.0). Electronically Signed   By: Garald Balding M.D.   On: 10/25/2017 22:47    Labs:  CBC: Recent Labs    10/08/17 0806 10/09/17 0551 10/25/17 1404 10/26/17 0450  WBC 7.8 8.9 8.8 6.6  HGB 16.6* 14.2 16.7* 13.7  HCT 50.0* 43.8 46.0 40.4  PLT 376 376 234 195    COAGS: Recent Labs    10/26/17 1116  INR 0.98  BMP: Recent Labs     10/25/17 1404 10/25/17 2356 10/26/17 0256 10/26/17 0450  NA 132* 132* 132* 134*  K 2.3* 2.4* 2.9* 2.8*  CL 89* 95* 99* 99*  CO2 26 23 22 23   GLUCOSE 100* 75 94 120*  BUN 16 13 13 13   CALCIUM 9.4 7.9* 7.8* 8.0*  CREATININE 0.98 0.82 0.76 0.75  GFRNONAA >60 >60 >60 >60  GFRAA >60 >60 >60 >60    LIVER FUNCTION TESTS: Recent Labs    01/23/17 2055 10/25/17 1404 10/26/17 0450  BILITOT 0.6 1.3* 0.9  AST 34 24 18  ALT 20 13* 9*  ALKPHOS 53 61 46  PROT 8.1 7.8 5.8*  ALBUMIN 4.3 3.6 2.7*    TUMOR MARKERS: No results for input(s): AFPTM, CEA, CA199, CHROMGRNA in the last 8760 hours.  Assessment and Plan:  Head/neck Ca Progressive dysphagia Malnutrition Scheduled for percutaneous gastric tube placement in IR 3/18 Risks and benefits discussed with the patient including, but not limited to the need for a barium enema during the procedure, bleeding, infection, peritonitis, or damage to adjacent structures.  All of the patient's questions were answered, patient is agreeable to proceed. Consent signed and in chart.   Thank you for this interesting consult.  I greatly enjoyed meeting Cynthia Church and look forward to participating in their care.  A copy of this report was sent to the requesting provider on this date.  Electronically Signed: Lavonia Drafts, PA-C 10/26/2017, 12:37 PM   I spent a total of 40 Minutes    in face to face in clinical consultation, greater than 50% of which was counseling/coordinating care for percutaneous gastric tube

## 2017-10-26 NOTE — ED Notes (Signed)
Pt's G-tube is rescheduled for Monday, Mar.18, instead of today. Pt is very upset and cursing.  Dr. Here to see pt.

## 2017-10-26 NOTE — ED Notes (Signed)
Pt requested for her door to be closed so she can rest.

## 2017-10-27 LAB — BASIC METABOLIC PANEL
ANION GAP: 10 (ref 5–15)
BUN: 5 mg/dL — ABNORMAL LOW (ref 6–20)
CO2: 24 mmol/L (ref 22–32)
Calcium: 8.1 mg/dL — ABNORMAL LOW (ref 8.9–10.3)
Chloride: 98 mmol/L — ABNORMAL LOW (ref 101–111)
Creatinine, Ser: 0.72 mg/dL (ref 0.44–1.00)
Glucose, Bld: 86 mg/dL (ref 65–99)
POTASSIUM: 2.8 mmol/L — AB (ref 3.5–5.1)
SODIUM: 132 mmol/L — AB (ref 135–145)

## 2017-10-27 LAB — URINE CULTURE: Culture: NO GROWTH

## 2017-10-27 MED ORDER — POTASSIUM CHLORIDE 10 MEQ/100ML IV SOLN
10.0000 meq | INTRAVENOUS | Status: AC
  Administered 2017-10-27 (×3): 10 meq via INTRAVENOUS
  Filled 2017-10-27 (×3): qty 100

## 2017-10-27 MED ORDER — LEVOTHYROXINE SODIUM 100 MCG IV SOLR
25.0000 ug | Freq: Every day | INTRAVENOUS | Status: DC
  Administered 2017-10-27 – 2017-10-31 (×5): 25 ug via INTRAVENOUS
  Filled 2017-10-27 (×5): qty 5

## 2017-10-27 MED ORDER — POTASSIUM CHLORIDE 10 MEQ/50ML IV SOLN
10.0000 meq | INTRAVENOUS | Status: DC
  Filled 2017-10-27 (×3): qty 50

## 2017-10-27 NOTE — Progress Notes (Signed)
Patient requesting "a nerve pill", paged provider, no orders at this time.

## 2017-10-27 NOTE — Progress Notes (Signed)
PROGRESS NOTE    Cynthia Church  FTD:[id number] DOB: [birth year] DOA: 10/25/2017 PCP: Patient, No Pcp Per  Outpatient [facility]ists Dr. Constance Holster, ENT    Brief Narrative:   Cynthia Church is a 52 year old lady with medical history significant for but not limited to COPD and abdominal neck CT concerning for head and neck cancer, alcoholism and noncompliance, presenting with progressive dysphagia and odynophagia to the point of not being able to eat.    Patient had been seen by ear nose and throat specialist Dr. Constance Holster few days ago at which time admission was recommended but she refused.  She was noted to be severely hypokalemic and admitted for further management after initial ER resuscitation.      Assessment & Plan:   Active Problems:   Tobacco abuse   Bipolar disorder (HCC)   Chronic pain syndrome   Hypokalemia   Abnormal CT scan, neck   Chest pain   Right ear pain  #1 severe hypokalemia: Poor oral intake IV  Repletion Check magnesium level Follow renal function with electrolytes #2  #2 dysphasia/dysphagia: Due to head and neck cancer IR consulted for feeding tube Dysphagia ! Diet NPO after MN forfeeding tube placement tomorrow  #3 head and neck cancer: ENT consult to see me on Monday PT/OT eval prior to discharge  #4 history of chronic pain/substance abuse/tobacco abuse: Supportive care Social work consult  #5 UTI Antibiotic Follow-up urine culture  #6 protein calorie malnutrition: Due to poor oral intake with head and neck cancer Feeding tube on Monday Nutrition consult follow-up  #7 hypothyroidism: Thyroid replacement IV initiated-switch to per tube  G-tube placement TSH repeat in 3-4 weeks  DVT prophylaxis: SCD's Code Status: (Full) Family Communication: Disposition Plan: To be determined may need placement-  Consultants:    Procedures: Antimicrobials: Rocephin  Subjective: Patient wants to eat, and had actually been drinking even though she  had been n.p.o. early.  Denies any fever or chills. Objective: Vitals:   10/26/17 1750 10/26/17 2100 10/27/17 0500 10/27/17 0955  BP: 119/83 109/71 123/80 107/64  Pulse: 72 75 78 80  Resp: 18 18 18 16   Temp: 98.1 F (36.7 C) 97.9 F (36.6 C) (!) 97.5 F (36.4 C) 98.2 F (36.8 C)  TempSrc: Oral Oral Oral Oral  SpO2: 96% 97% 97% 96%  Weight: 39.7 kg (87 lb 8 oz)     Height: 5\' 2"  (1.575 m)       Intake/Output Summary (Last 24 hours) at 10/27/2017 1027 Last data filed at 10/27/2017 0400 Gross per 24 hour  Intake 1600 ml  Output -  Net 1600 ml   Filed Weights   10/25/17 1748 10/26/17 1750  Weight: 36.6 kg (80 lb 9.6 oz) 39.7 kg (87 lb 8 oz)    Examination:  General exam: No acute distress, chronically ill looking  Respiratory system: Mild diminished breath sounds otherwise unremarkable Cardiovascular system: S1 & S2 heard, RRR. No JVD, murmurs, rubs, gallops or clicks. No pedal edema. Gastrointestinal system: Abdomen is nondistended, soft and nontender. No organomegaly or masses felt. Normal bowel sounds heard. Central nervous system: Alert and oriented. No focal neurological deficits. Extremities: Symmetric 5 x 5 power. Skin: No rashes, lesions or ulcers Psychiatry: Judgement and insight appear normal. Mood & affect appropriate.     Data Reviewed: I have personally reviewed following labs and imaging studies  CBC: Recent Labs  Lab 10/25/17 1404 10/26/17 0450  WBC 8.8 6.6  NEUTROABS 6.6  --   HGB 16.7* 13.7  HCT 46.0 40.4  MCV 91.3 92.2  PLT 234 416   Basic Metabolic Panel: Recent Labs  Lab 10/25/17 1750  10/26/17 0450 10/26/17 1116 10/26/17 1523 10/26/17 1913 10/27/17 0504  NA  --    < > 134* 133* 130* 130* 132*  K  --    < > 2.8* 2.5* 2.8* 2.9* 2.8*  CL  --    < > 99* 98* 95* 98* 98*  CO2  --    < > 23 24 23 24 24   GLUCOSE  --    < > 120* 133* 179* 117* 86  BUN  --    < > 13 10 9 8  5*  CREATININE  --    < > 0.75 0.66 0.68 0.69 0.72  CALCIUM  --     < > 8.0* 8.0* 8.1* 7.9* 8.1*  MG 1.8  --  2.1  --   --   --   --   PHOS 3.0  --  2.8  --   --   --   --    < > = values in this interval not displayed.   GFR: Estimated Creatinine Clearance: 52.7 mL/min (by C-G formula based on SCr of 0.72 mg/dL). Liver Function Tests: Recent Labs  Lab 10/25/17 1404 10/26/17 0450  AST 24 18  ALT 13* 9*  ALKPHOS 61 46  BILITOT 1.3* 0.9  PROT 7.8 5.8*  ALBUMIN 3.6 2.7*   No results for input(s): LIPASE, AMYLASE in the last 168 hours. No results for input(s): AMMONIA in the last 168 hours. Coagulation Profile: Recent Labs  Lab 10/26/17 1116  INR 0.98   Cardiac Enzymes: Recent Labs  Lab 10/25/17 2356 10/26/17 0450 10/26/17 1116  TROPONINI <0.03 <0.03 <0.03   BNP (last 3 results) No results for input(s): PROBNP in the last 8760 hours. HbA1C: No results for input(s): HGBA1C in the last 72 hours. CBG: No results for input(s): GLUCAP in the last 168 hours. Lipid Profile: No results for input(s): CHOL, HDL, LDLCALC, TRIG, CHOLHDL, LDLDIRECT in the last 72 hours. Thyroid Function Tests: Recent Labs    10/26/17 0450  TSH 20.490*   Anemia Panel: No results for input(s): VITAMINB12, FOLATE, FERRITIN, TIBC, IRON, RETICCTPCT in the last 72 hours. Urine analysis:    Component Value Date/Time   COLORURINE YELLOW 10/26/2017 0151   APPEARANCEUR HAZY (A) 10/26/2017 0151   LABSPEC >1.046 (H) 10/26/2017 0151   PHURINE 7.0 10/26/2017 0151   GLUCOSEU NEGATIVE 10/26/2017 0151   HGBUR SMALL (A) 10/26/2017 0151   BILIRUBINUR NEGATIVE 10/26/2017 0151   KETONESUR 5 (A) 10/26/2017 0151   PROTEINUR NEGATIVE 10/26/2017 0151   UROBILINOGEN 0.2 06/01/2015 1329   NITRITE NEGATIVE 10/26/2017 0151   LEUKOCYTESUR MODERATE (A) 10/26/2017 0151   Sepsis Labs: @LABRCNTIP (procalcitonin:4,lacticidven:4)  )No results found for this or any previous visit (from the past 240 hour(s)).       Radiology Studies: Ct Chest W Contrast  Result Date:  10/25/2017 CLINICAL DATA:  Acute onset of generalized chest and epigastric pain. Recent weight loss. Current history of head and neck malignancy. EXAM: CT CHEST, ABDOMEN, AND PELVIS WITH CONTRAST TECHNIQUE: Multidetector CT imaging of the chest, abdomen and pelvis was performed following the standard protocol during bolus administration of intravenous contrast. CONTRAST:  189mL ISOVUE-300 IOPAMIDOL (ISOVUE-300) INJECTION 61% COMPARISON:  Chest radiograph performed 01/23/2017, and CT of the abdomen and pelvis performed 03/05/2016 FINDINGS: CT CHEST FINDINGS Cardiovascular: The heart is normal in size. The thoracic aorta is unremarkable. The  great vessels are within normal limits. Mediastinum/Nodes: The mediastinum is unremarkable in appearance. No mediastinal lymphadenopathy is seen. The thyroid gland is grossly unremarkable in appearance. No axillary lymphadenopathy is seen. Lungs/Pleura: A tiny peripheral opacity at the right middle lobe is thought to reflect atelectasis. A calcified granuloma is noted at the left lung base. No focal consolidation, pleural effusion or pneumothorax is seen. No dominant mass is identified. Musculoskeletal: No acute osseous abnormalities are identified. The visualized musculature is unremarkable in appearance. Enhancement anterior to the trachea at the level of the neck is thought to reflect prior procedure in this region. CT ABDOMEN PELVIS FINDINGS Hepatobiliary: The liver is unremarkable in appearance. The gallbladder is unremarkable in appearance. The common bile duct remains normal in caliber. Pancreas: The pancreas is within normal limits. Spleen: The spleen is unremarkable in appearance. Adrenals/Urinary Tract: The adrenal glands are unremarkable in appearance. The kidneys are within normal limits. There is no evidence of hydronephrosis. No renal or ureteral stones are identified. No perinephric stranding is seen. Stomach/Bowel: The stomach is unremarkable in appearance. The  small bowel is within normal limits. The appendix is not visualized; there is no evidence for appendicitis. The colon is unremarkable in appearance. Mild soft tissue inflammation is noted along the left paracolic gutter, of uncertain significance. This could reflect an omental infarct. Vascular/Lymphatic: Scattered calcification is seen along the abdominal aorta and its branches. The abdominal aorta is otherwise grossly unremarkable. The inferior vena cava is grossly unremarkable. No retroperitoneal lymphadenopathy is seen. No pelvic sidewall lymphadenopathy is identified. Reproductive: The bladder is mildly distended and grossly unremarkable. The uterus is grossly unremarkable. No suspicious adnexal masses are seen. Other: No additional soft tissue abnormalities are seen. Musculoskeletal: No acute osseous abnormalities are identified. The visualized musculature is unremarkable in appearance. IMPRESSION: 1. Mild soft tissue inflammation along the left paracolic gutter, of uncertain significance. This could reflect an omental infarct. 2. No evidence of metastatic disease to the chest, abdomen or pelvis. Aortic Atherosclerosis (ICD10-I70.0). Electronically Signed   By: Garald Balding M.D.   On: 10/25/2017 22:47   Ct Abdomen Pelvis W Contrast  Result Date: 10/25/2017 CLINICAL DATA:  Acute onset of generalized chest and epigastric pain. Recent weight loss. Current history of head and neck malignancy. EXAM: CT CHEST, ABDOMEN, AND PELVIS WITH CONTRAST TECHNIQUE: Multidetector CT imaging of the chest, abdomen and pelvis was performed following the standard protocol during bolus administration of intravenous contrast. CONTRAST:  155mL ISOVUE-300 IOPAMIDOL (ISOVUE-300) INJECTION 61% COMPARISON:  Chest radiograph performed 01/23/2017, and CT of the abdomen and pelvis performed 03/05/2016 FINDINGS: CT CHEST FINDINGS Cardiovascular: The heart is normal in size. The thoracic aorta is unremarkable. The great vessels are  within normal limits. Mediastinum/Nodes: The mediastinum is unremarkable in appearance. No mediastinal lymphadenopathy is seen. The thyroid gland is grossly unremarkable in appearance. No axillary lymphadenopathy is seen. Lungs/Pleura: A tiny peripheral opacity at the right middle lobe is thought to reflect atelectasis. A calcified granuloma is noted at the left lung base. No focal consolidation, pleural effusion or pneumothorax is seen. No dominant mass is identified. Musculoskeletal: No acute osseous abnormalities are identified. The visualized musculature is unremarkable in appearance. Enhancement anterior to the trachea at the level of the neck is thought to reflect prior procedure in this region. CT ABDOMEN PELVIS FINDINGS Hepatobiliary: The liver is unremarkable in appearance. The gallbladder is unremarkable in appearance. The common bile duct remains normal in caliber. Pancreas: The pancreas is within normal limits. Spleen: The spleen is unremarkable  in appearance. Adrenals/Urinary Tract: The adrenal glands are unremarkable in appearance. The kidneys are within normal limits. There is no evidence of hydronephrosis. No renal or ureteral stones are identified. No perinephric stranding is seen. Stomach/Bowel: The stomach is unremarkable in appearance. The small bowel is within normal limits. The appendix is not visualized; there is no evidence for appendicitis. The colon is unremarkable in appearance. Mild soft tissue inflammation is noted along the left paracolic gutter, of uncertain significance. This could reflect an omental infarct. Vascular/Lymphatic: Scattered calcification is seen along the abdominal aorta and its branches. The abdominal aorta is otherwise grossly unremarkable. The inferior vena cava is grossly unremarkable. No retroperitoneal lymphadenopathy is seen. No pelvic sidewall lymphadenopathy is identified. Reproductive: The bladder is mildly distended and grossly unremarkable. The uterus is  grossly unremarkable. No suspicious adnexal masses are seen. Other: No additional soft tissue abnormalities are seen. Musculoskeletal: No acute osseous abnormalities are identified. The visualized musculature is unremarkable in appearance. IMPRESSION: 1. Mild soft tissue inflammation along the left paracolic gutter, of uncertain significance. This could reflect an omental infarct. 2. No evidence of metastatic disease to the chest, abdomen or pelvis. Aortic Atherosclerosis (ICD10-I70.0). Electronically Signed   By: Garald Balding M.D.   On: 10/25/2017 22:47        Scheduled Meds: . folic acid  1 mg Intravenous Daily  . nicotine  21 mg Transdermal Daily  . potassium chloride  40 mEq Oral Once  . thiamine  100 mg Intravenous Daily   Continuous Infusions: . cefTRIAXone (ROCEPHIN)  IV 1 g (10/27/17 0943)  . dextrose 5 % and 0.9 % NaCl with KCl 40 mEq/L 75 mL/hr at 10/27/17 0328  . potassium chloride 10 mEq (10/27/17 0943)     LOS: 2 days    Time spent: 67  minutes    OSEI-BONSU,Corbet Hanley, MD [facility] Pager [phone]  If 7PM-7AM, please contact night-coverage [email or web address] Password [facility] 10/27/2017, 10:27 AM

## 2017-10-27 NOTE — Evaluation (Signed)
Clinical/Bedside Swallow Evaluation Patient Details  Name: Cynthia Church MRN: [id number] Date of Birth: 1965/12/21  Today's Date: 10/27/2017 Time: SLP Start Time (ACUTE ONLY): 0933 SLP Stop Time (ACUTE ONLY): 0959 SLP Time Calculation (min) (ACUTE ONLY): 26 min  Past Medical History:  Past Medical History:  Diagnosis Date  . Alcoholism ([place])   . Bipolar affective disorder, manic ([place]) 1993   Dx'd at [facility]  . Cancer ([place]) 1989   uterine  . Chronic pain   . COPD (chronic obstructive pulmonary disease) ([place])   . Headache(784.0) 09/25/2011  . Hyperthyroidism 1993  . Low back pain   . Mental disorder   . Personality disorders 1993   Past Surgical History:  Past Surgical History:  Procedure Laterality Date  . BACK SURGERY    . BREAST SURGERY  2003   to check for possible cancer  . cauterization of uterine cancer  2008  . tubal ligaation     HPI:  Cynthia Berquist Woodsis a 52 year old lady with medical history significant for but not limited to COPD and abdominal neck CT concerning for head and neck cancer, alcoholism and noncompliance, presenting with progressive dysphagia and odynophagia to the point of not being able to eat. She visited in ED in late february with similiar complaint but left AMA after getting potassium.  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 Severe airway effacement at level of hypopharynx. Probable infiltrative invasion of right parapharyngeal, prevertebral, and carotid spaces at approximatelyC2-C5 levels. Necrotic right retropharyngeal, level 2B, and level 3 metastatic lymphadenopathy. 2-3 mm pulmonary nodules in upper lobes bilaterally. SHe was seen by Dr. 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 Primary concern is medical stabilization with feeding tube, then biopsy.    Assessment / Plan / Recommendation Clinical Impression  SLP called by RN to assess pt as she is begging to be allowed to eat and drink. Though she reports inability to keep anything down without regurgitation, she also wants to be able to put food in her mouth and swallow a bit at a time and expectorate as needed. She is aware of the inability to nutritionally support herself in this manner and is agreeable to plan for PEG tomorrow. When observed with sips of water there are multiple effortful swallows followed by excpectoration of a portion of the bolus with mucous. Puree also attempted with less expectoration. Aspiration risk is present, but pt appears to have the capacity to protect her airway with strong cough mechanism.   Ultimately the pt will need objective swallow assessment for baseline function as treatment plan evolves. MBS is not the primary concern at this point however given probable PEG placement tomorrow. Would recommend pt be allowed sips of thin liquids and bites of purees and pudding for comfort during admission, particularly since she would be more likely to leave AMA and terminate treatment if her comfort is not addressed. If MD is agreeable, will start diet for today until midnight when pt will need to remain NPO for PEG tomorrow.   SLP Visit Diagnosis: Dysphagia, oropharyngeal phase (R13.12)    Aspiration Risk  Severe aspiration risk;Risk for inadequate nutrition/hydration    Diet Recommendation Dysphagia 1 (Puree);Thin liquid   Liquid Administration via: Cup Medication Administration: Via alternative means Supervision:  Patient able to self feed Compensations: Slow rate;Small sips/bites;Follow solids with liquid Postural Changes: Seated upright at 90 degrees;Remain upright for at least 30 minutes after po intake    Other  Recommendations  Oral Care Recommendations: Oral care BID Other Recommendations: Have oral suction available   Follow up Recommendations Outpatient SLP      Frequency and Duration min 2x/week  2 weeks       Prognosis Prognosis for Safe Diet Advancement: Guarded Barriers to Reach Goals: Severity of deficits      Swallow Study   General HPI: Cynthia Hepler Woodsis a 52 year old lady with medical history significant for but not limited to COPD and abdominal neck CT concerning for head and neck cancer, alcoholism and noncompliance, presenting with progressive dysphagia and odynophagia to the point of not being able to eat. She visited in ED in late february with similiar complaint but left AMA after getting potassium.  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 Severe airway effacement at level of hypopharynx. Probable infiltrative invasion of right parapharyngeal, prevertebral, and carotid spaces at approximatelyC2-C5 levels. Necrotic right retropharyngeal, level 2B, and level 3 metastatic lymphadenopathy. 2-3 mm pulmonary nodules in upper lobes bilaterally. SHe was seen by Dr. 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 Primary concern is medical stabilization with feeding tube, then biopsy.  Type of Study: Bedside Swallow Evaluation Diet Prior to this Study: NPO Temperature Spikes Noted: No Respiratory Status: Room air History of Recent Intubation: No Behavior/Cognition: Alert;Cooperative Oral Cavity Assessment: Within Functional Limits Oral Care Completed by SLP: No Oral Cavity - Dentition: Edentulous Vision: Functional for self-feeding Self-Feeding Abilities: Able to feed self Patient  Positioning: Upright in bed Baseline Vocal Quality: Hoarse Volitional Cough: Strong Volitional Swallow: Able to elicit    Oral/Motor/Sensory Function Overall Oral Motor/Sensory Function: Within functional limits   Ice Chips     Thin Liquid Thin Liquid: Impaired Presentation: Cup;Self Fed Pharyngeal  Phase Impairments: Multiple swallows;Cough - Immediate    Nectar Thick Nectar Thick Liquid: Not tested   Honey Thick Honey Thick Liquid: Not tested   Puree Puree: Impaired Presentation: Spoon Pharyngeal Phase Impairments: Multiple swallows;Cough - Delayed   Solid   GO   Solid: Not tested       Herbie Baltimore, MA CCC-SLP [phone]  Lamarcus Spira, Katherene Ponto 10/27/2017,9:59 AM

## 2017-10-27 NOTE — Progress Notes (Signed)
Patient diet order is NPO, she is requesting breakfast, paged provider to ask for diet and to make aware that potassium is 2.8.

## 2017-10-27 NOTE — Evaluation (Signed)
Occupational Therapy Evaluation and Discharge Patient Details Name: Cynthia Church MRN: [id number] DOB: 05/01/1966 Today's Date: 10/27/2017    History of Present Illness Patient presents to the hospital with difficulty swallowing. CT: abdominal neck CT concerning for head and neck cancer,PMH: bipolar; cancer, alcoholism, Low back pai, COPD,mulitple personsality disorder (7) per pt   Clinical Impression   This 52 yo female admitted with above presents to acute OT at an independent level from ADL and mobility standpoint. No further OT needs, we will sign off.    Follow Up Recommendations  No OT follow up    Equipment Recommendations  None recommended by OT       Precautions / Restrictions Precautions Precautions: None Restrictions Weight Bearing Restrictions: No      Mobility Bed Mobility Overal bed mobility: Independent                Transfers Overall transfer level: Independent               General transfer comment: Able to walk up and down hallway as well as in her room without issues        ADL either performed or assessed with clinical judgement   ADL Overall ADL's : Independent                                             Vision Patient Visual Report: No change from baseline              Pertinent Vitals/Pain Pain Assessment: 0-10 Pain Score: 2  Pain Location: right ear and hip Pain Descriptors / Indicators: Sore;Aching Pain Intervention(s): Monitored during session     Hand Dominance Right   Extremity/Trunk Assessment Upper Extremity Assessment Upper Extremity Assessment: Overall WFL for tasks assessed           Communication Communication Communication: No difficulties   Cognition Arousal/Alertness: Awake/alert Behavior During Therapy: WFL for tasks assessed/performed Overall Cognitive Status: Within Functional Limits for tasks assessed                                                 Home Living Family/patient expects to be discharged to:: Private residence   Available Help at Discharge: Friend(s);Available 24 hours/day Type of Home: House Home Access: Level entry     Home Layout: One level     Bathroom Shower/Tub: Teacher, early years/pre: Standard                Prior Functioning/Environment Level of Independence: Independent        Comments: does not drive                 OT Goals(Current goals can be found in the care plan section) Acute Rehab OT Goals Patient Stated Goal: sx tomorrow and then home  OT Frequency:                AM-PAC PT "6 Clicks" Daily Activity     Outcome Measure Help from another person eating meals?: None Help from another person taking care of personal grooming?: None Help from another person toileting, which includes using toliet, bedpan, or urinal?: None Help from another person bathing (including washing, rinsing, drying)?: None Help from  another person to put on and taking off regular upper body clothing?: None Help from another person to put on and taking off regular lower body clothing?: None 6 Click Score: 24   End of Session Equipment Utilized During Treatment: (none) Nurse Communication: (finished with pt so IV could be re-started)  Activity Tolerance: Patient tolerated treatment well Patient left: in bed(no bed alarm (not on when I entered and pt is independent)                   Time: 1352-1409 OT Time Calculation (min): 17 min Charges:  OT General Charges $OT Visit: 1 Visit OT Evaluation $OT Eval Moderate Complexity: [address] [place], [place] [phone] 10/27/2017

## 2017-10-28 ENCOUNTER — Encounter (HOSPITAL_COMMUNITY): Payer: Self-pay | Admitting: Interventional Radiology

## 2017-10-28 ENCOUNTER — Inpatient Hospital Stay (HOSPITAL_COMMUNITY): Payer: Medicare Other

## 2017-10-28 HISTORY — PX: IR FLUORO RM 30-60 MIN: IMG2384

## 2017-10-28 LAB — BASIC METABOLIC PANEL
Anion gap: 5 (ref 5–15)
Anion gap: 7 (ref 5–15)
CHLORIDE: 105 mmol/L (ref 101–111)
CO2: 23 mmol/L (ref 22–32)
CO2: 23 mmol/L (ref 22–32)
CREATININE: 0.52 mg/dL (ref 0.44–1.00)
CREATININE: 0.55 mg/dL (ref 0.44–1.00)
Calcium: 7.5 mg/dL — ABNORMAL LOW (ref 8.9–10.3)
Calcium: 7.7 mg/dL — ABNORMAL LOW (ref 8.9–10.3)
Chloride: 103 mmol/L (ref 101–111)
GFR calc Af Amer: >60 mL/min (ref >60)
GFR calc Af Amer: >60 mL/min (ref >60)
GLUCOSE: 103 mg/dL — AB (ref 65–99)
GLUCOSE: 92 mg/dL (ref 65–99)
POTASSIUM: 4.1 mmol/L (ref 3.5–5.1)
POTASSIUM: 3.9 mmol/L (ref 3.5–5.1)
SODIUM: 131 mmol/L — AB (ref 135–145)
SODIUM: 135 mmol/L (ref 135–145)

## 2017-10-28 MED ORDER — FENTANYL CITRATE (PF) 100 MCG/2ML IJ SOLN
INTRAMUSCULAR | Status: AC
  Filled 2017-10-28: qty 4

## 2017-10-28 MED ORDER — GLUCAGON HCL (RDNA) 1 MG IJ SOLR
INTRAMUSCULAR | Status: AC | PRN
  Administered 2017-10-28: .5 mg via INTRAVENOUS

## 2017-10-28 MED ORDER — MIDAZOLAM HCL 2 MG/2ML IJ SOLN
INTRAMUSCULAR | Status: AC
Start: 2017-10-28 — End: 2017-10-28
  Filled 2017-10-28: qty 4

## 2017-10-28 MED ORDER — MUPIROCIN 2 % EX OINT
1.0000 "application " | TOPICAL_OINTMENT | Freq: Two times a day (BID) | CUTANEOUS | Status: DC
  Administered 2017-10-28 – 2017-10-29 (×3): 1 via NASAL
  Filled 2017-10-28 (×2): qty 22

## 2017-10-28 MED ORDER — GLUCAGON HCL RDNA (DIAGNOSTIC) 1 MG IJ SOLR
INTRAMUSCULAR | Status: AC
  Filled 2017-10-28: qty 1

## 2017-10-28 MED ORDER — LIDOCAINE HCL 1 % IJ SOLN
INTRAMUSCULAR | Status: AC
  Filled 2017-10-28: qty 20

## 2017-10-28 MED ORDER — FENTANYL CITRATE (PF) 100 MCG/2ML IJ SOLN
INTRAMUSCULAR | Status: AC | PRN
  Administered 2017-10-28: 50 ug via INTRAVENOUS
  Administered 2017-10-28: 25 ug via INTRAVENOUS

## 2017-10-28 MED ORDER — MIDAZOLAM HCL 2 MG/2ML IJ SOLN
INTRAMUSCULAR | Status: AC | PRN
  Administered 2017-10-28: 0.5 mg via INTRAVENOUS
  Administered 2017-10-28: 1 mg via INTRAVENOUS

## 2017-10-28 MED ORDER — IOPAMIDOL (ISOVUE-300) INJECTION 61%
INTRAVENOUS | Status: AC
  Filled 2017-10-28: qty 50

## 2017-10-28 MED ORDER — CEFAZOLIN SODIUM-DEXTROSE 2-4 GM/100ML-% IV SOLN
INTRAVENOUS | Status: AC
  Administered 2017-10-28: 2000 mg
  Filled 2017-10-28: qty 100

## 2017-10-28 NOTE — Progress Notes (Signed)
Initial Nutrition Assessment  DOCUMENTATION CODES:   Severe malnutrition in context of chronic illness  INTERVENTION:  When G-Tube cleared for use, recommend Jevity 1.2 at 8mL/hr, increase by 10 every 24 hours to goal rate of 64mL/hr  At goal, provides 1440 calories, 67 grams of protein, and 979mL free water  If not receiving any other sources of free water, recommend 164mL free water every 6 hours, providing a total of 145mL free water  Recommend monitor K+, Phos, Mg for at least 3 days and replete as necessary. Patient is at high risk for refeeding given little to no PO x3 months and severe weight loss.  NUTRITION DIAGNOSIS:   Severe Malnutrition related to chronic illness as evidenced by energy intake < or equal to 75% for > or equal to 1 month, moderate muscle depletion, moderate fat depletion, percent weight loss.  GOAL:   Patient will meet greater than or equal to 90% of their needs  MONITOR:   Labs, I & O's, PO intake, Skin, Weight trends, TF tolerance, Diet advancement  REASON FOR ASSESSMENT:   Consult Assessment of nutrition requirement/status  ASSESSMENT:   Cynthia Church is a 52 year old lady with medical history significant for but not limited to COPD and abdominal neck CT concerning for head and neck cancer, alcoholism and noncompliance, presenting with progressive dysphagia and odynophagia to the point of not being able to eat.   Spoke with patient at bedside. She reports not being able to eat anything for the past 3 months and constantly being in pain. Was on NDD1-thin liquids yesterday, states she can be ok with most liquids, but unable to really eat solid food. Reports losing weight from 112 pounds to 80 pounds over the past 3 months a 22.3% severe weight loss for timeframe. Per RN, she spit out half of food and liquid yesterday. Asking for food today. Denies nausea/vomiting. IR was unable to place PEG due to risk of bowel perf earlier. Surgery consulted, Open  G-Tube to be placed tomorrow.  Labs reviewed Medications reviewed and include:  Folic Acid, 35T+, Thiamine D5 NS at 66mL/hr --> 306 calories   NUTRITION - FOCUSED PHYSICAL EXAM:    Most Recent Value  Orbital Region  Moderate depletion  Upper Arm Region  Severe depletion  Thoracic and Lumbar Region  Moderate depletion  Buccal Region  Moderate depletion  Temple Region  Moderate depletion  Clavicle Bone Region  Moderate depletion  Clavicle and Acromion Bone Region  Moderate depletion  Scapular Bone Region  Severe depletion  Dorsal Hand  Unable to assess  Patellar Region  Moderate depletion  Anterior Thigh Region  Moderate depletion  Posterior Calf Region  Moderate depletion  Edema (RD Assessment)  None       Diet Order:  DIET - DYS 1 Room service appropriate? Yes; Fluid consistency: Thin Diet NPO time specified  EDUCATION NEEDS:   Not appropriate for education at this time  Skin:  Skin Assessment: Reviewed RN Assessment  Last BM:  PTA  Height:   Ht Readings from Last 1 Encounters:  10/26/17 5\' 2"  (1.575 m)    Weight:   Wt Readings from Last 1 Encounters:  10/26/17 87 lb 8 oz (39.7 kg)    Ideal Body Weight:  50 kg  BMI:  Body mass index is 16 kg/m.  Estimated Nutritional Needs:   Kcal:  1400-1600 calories (35-40 cal/kg)  Protein:  60-67 grams (1.5-1.7g/kg)  Fluid:  >1.5L  Cynthia Church. Cynthia Hollenkamp, MS, RD LDN Inpatient Clinical  Dietitian Pager [phone]

## 2017-10-28 NOTE — Procedures (Signed)
  Procedure: Gastrostromy tube (attempted) Colon overlies stomach. NO safe percutaneous approach. Consider surgical consultation. EBL:   minimal Complications:  none immediate  See full dictation in [facility].  Dillard Cannon MD Main # [phone] Pager  [phone]

## 2017-10-28 NOTE — Sedation Documentation (Signed)
Patient is resting comfortably. 

## 2017-10-28 NOTE — Progress Notes (Signed)
Palliative Medicine consult noted. Due to high referral volume, there may be a delay seeing this patient. Please call the Palliative Medicine Team office at [phone] if recommendations are needed in the interim.  Thank you for inviting [place] to see this patient.  Marjie Skiff Jalacia Mattila, RN, BSN, [facility] Palliative Medicine Team 10/28/2017 12:06 PM Office [phone]

## 2017-10-28 NOTE — Consult Note (Signed)
[facility] Surgery Consult/Admission Note  Cynthia Church [birth year]  [id number].    Requesting MD: Dr. Reesa Chew Chief Complaint/Reason for Consult: Open G tube  HPI:  52 year old female with history of COPD, bi polar disorder, substance abuse, medication noncompliance was recently found to have head and neck cancer on the CT has been having symptoms of progressive dysphagia odynophagia therefore sent to the hospital by Dr. Constance Holster from ENT to be admitted. Since admission IR attempted to place a PEG tube but was unsuccessful due to the colon overlying the stomach. We were asked to see for placement of open G tube. Pt states she is currently having mild mid abdominal pain with no associated symptoms. No nausea. She is having difficultly swallowing. She denies previous abdominal surgeries although EMR shows tubal ligation in her history. She is not anticoagulated.   ROS:  Review of Systems  Constitutional: Negative for chills, diaphoresis and fever.  HENT: Negative for sore throat.   Respiratory: Negative for cough and shortness of breath.   Cardiovascular: Negative for chest pain.  Gastrointestinal: Positive for abdominal pain. Negative for blood in stool, constipation, diarrhea, nausea and vomiting.  Genitourinary: Negative for dysuria.  Skin: Negative for rash.  Neurological: Negative for dizziness and loss of consciousness.  All other systems reviewed and are negative.    Family History  Problem Relation Age of Onset  . Cirrhosis Father     Past Medical History:  Diagnosis Date  . Alcoholism ([place])   . Bipolar affective disorder, manic ([place]) 1993   Dx'd at [facility]  . Cancer ([place]) 1989   uterine  . Chronic pain   . COPD (chronic obstructive pulmonary disease) ([place])   . Headache(784.0) 09/25/2011  . Hyperthyroidism 1993  . Low back pain   . Mental disorder   . Personality disorders 1993    Past Surgical History:  Procedure Laterality Date  . BACK SURGERY    .  BREAST SURGERY  2003   to check for possible cancer  . cauterization of uterine cancer  2008  . IR FLUORO RM 30-60 MIN  10/28/2017  . tubal ligaation      Social History:  reports that she has been smoking cigarettes.  She has a 84.00 pack-year smoking history. she has never used smokeless tobacco. She reports that she drinks alcohol. She reports that she uses drugs. Drugs: Marijuana and Cocaine.  Allergies: No Known Allergies  Medications Prior to Admission  Medication Sig Dispense Refill  . diphenhydrAMINE (BENADRYL) 25 MG tablet Take 1 tablet (25 mg total) by mouth every 6 (six) hours. (Patient not taking: Reported on 10/25/2017) 12 tablet 0  . famotidine (PEPCID) 20 MG tablet Take 1 tablet (20 mg total) by mouth 2 (two) times daily. (Patient not taking: Reported on 10/25/2017) 10 tablet 0  . ibuprofen (ADVIL,MOTRIN) 800 MG tablet Take 1 tablet (800 mg total) by mouth every 8 (eight) hours as needed for moderate pain. (Patient not taking: Reported on 10/25/2017) 20 tablet 0  . levofloxacin (LEVAQUIN) 500 MG tablet Take 1 tablet (500 mg total) by mouth daily. (Patient not taking: Reported on 10/25/2017) 7 tablet 0  . metoprolol tartrate (LOPRESSOR) 25 MG tablet Take 1 tablet (25 mg total) by mouth 2 (two) times daily. (Patient not taking: Reported on 10/25/2017) 60 tablet 0  . predniSONE (DELTASONE) 10 MG tablet Take 6 tablets (60 mg total) by mouth daily. (Patient not taking: Reported on 10/25/2017) 30 tablet 0  . traZODone (DESYREL) 50 MG tablet  Take 1 tablet (50 mg total) by mouth at bedtime as needed for sleep. (Patient not taking: Reported on 08/12/2016) 30 tablet 0    Blood pressure (!) 175/117, pulse 96, temperature 97.8 F (36.6 C), temperature source Oral, resp. rate 12, height '5\' 2"'$  (1.575 m), weight 87 lb 8 oz (39.7 kg), SpO2 92 %.  Physical Exam  Constitutional: She is oriented to person, place, and time. Vital signs are normal. No distress.  Thin, cachetic, white woman who appears  older than stated age   HENT:  Head: Normocephalic and atraumatic.  Nose: Nose normal.  Mouth/Throat: Oropharynx is clear and moist.  Eyes: Conjunctivae are normal. Right eye exhibits no discharge. Left eye exhibits no discharge. No scleral icterus.  Pupils are equal and round  Neck: Normal range of motion. Neck supple. No thyromegaly present.  2 mobile small masses noted to right side anterior throat  Cardiovascular: Normal rate, regular rhythm, normal heart sounds and intact distal pulses. Exam reveals no gallop and no friction rub.  No murmur heard. Pulses:      Radial pulses are 2+ on the right side, and 2+ on the left side.  Pulmonary/Chest: Effort normal and breath sounds normal. No respiratory distress. She has no decreased breath sounds. She has no wheezes. She has no rhonchi. She has no rales.  Abdominal: Soft. Normal appearance and bowel sounds are normal. She exhibits no distension and no mass. There is no hepatosplenomegaly. There is no tenderness. There is no rebound and no guarding.  No scars noted  Musculoskeletal: Normal range of motion. She exhibits no edema or deformity.  Neurological: She is alert and oriented to person, place, and time.  Skin: Skin is warm and dry. No rash noted. She is not diaphoretic.  Psychiatric: Mood and affect normal.  Nursing note and vitals reviewed.   Results for orders placed or performed during the hospital encounter of 10/25/17 (from the past 48 hour(s))  Basic metabolic panel     Status: Abnormal   Collection Time: 10/26/17  3:23 PM  Result Value Ref Range   Sodium 130 (L) 135 - 145 mmol/L   Potassium 2.8 (L) 3.5 - 5.1 mmol/L   Chloride 95 (L) 101 - 111 mmol/L   CO2 23 22 - 32 mmol/L   Glucose, Bld 179 (H) 65 - 99 mg/dL   BUN 9 6 - 20 mg/dL   Creatinine, Ser 0.68 0.44 - 1.00 mg/dL   Calcium 8.1 (L) 8.9 - 10.3 mg/dL   GFR calc non Af Amer >60 >60 mL/min   GFR calc Af Amer >60 >60 mL/min    Comment: (NOTE) The eGFR has been  calculated using the CKD EPI equation. This calculation has not been validated in all clinical situations. eGFR's persistently <60 mL/min signify possible Chronic Kidney Disease.    Anion gap 12 5 - 15    Comment: Performed at [facility] [address], [place], [place] [address]  Basic metabolic panel     Status: Abnormal   Collection Time: 10/26/17  7:13 PM  Result Value Ref Range   Sodium 130 (L) 135 - 145 mmol/L   Potassium 2.9 (L) 3.5 - 5.1 mmol/L   Chloride 98 (L) 101 - 111 mmol/L   CO2 24 22 - 32 mmol/L   Glucose, Bld 117 (H) 65 - 99 mg/dL   BUN 8 6 - 20 mg/dL   Creatinine, Ser 0.69 0.44 - 1.00 mg/dL   Calcium 7.9 (L) 8.9 - 10.3  mg/dL   GFR calc non Af Amer >60 >60 mL/min   GFR calc Af Amer >60 >60 mL/min    Comment: (NOTE) The eGFR has been calculated using the CKD EPI equation. This calculation has not been validated in all clinical situations. eGFR's persistently <60 mL/min signify possible Chronic Kidney Disease.    Anion gap 8 5 - 15    Comment: Performed at [facility] [address] [place], [place] [address]  Basic metabolic panel     Status: Abnormal   Collection Time: 10/27/17  5:04 AM  Result Value Ref Range   Sodium 132 (L) 135 - 145 mmol/L   Potassium 2.8 (L) 3.5 - 5.1 mmol/L   Chloride 98 (L) 101 - 111 mmol/L   CO2 24 22 - 32 mmol/L   Glucose, Bld 86 65 - 99 mg/dL   BUN 5 (L) 6 - 20 mg/dL   Creatinine, Ser 0.72 0.44 - 1.00 mg/dL   Calcium 8.1 (L) 8.9 - 10.3 mg/dL   GFR calc non Af Amer >60 >60 mL/min   GFR calc Af Amer >60 >60 mL/min    Comment: (NOTE) The eGFR has been calculated using the CKD EPI equation. This calculation has not been validated in all clinical situations. eGFR's persistently <60 mL/min signify possible Chronic Kidney Disease.    Anion gap 10 5 - 15    Comment: Performed at [facility] [address] [place], [place] [address]  Basic metabolic panel     Status: Abnormal   Collection Time: 10/28/17  12:09 AM  Result Value Ref Range   Sodium 131 (L) 135 - 145 mmol/L   Potassium 4.1 3.5 - 5.1 mmol/L   Chloride 103 101 - 111 mmol/L   CO2 23 22 - 32 mmol/L   Glucose, Bld 103 (H) 65 - 99 mg/dL   BUN <5 (L) 6 - 20 mg/dL   Creatinine, Ser 0.52 0.44 - 1.00 mg/dL   Calcium 7.5 (L) 8.9 - 10.3 mg/dL   GFR calc non Af Amer >60 >60 mL/min   GFR calc Af Amer >60 >60 mL/min    Comment: (NOTE) The eGFR has been calculated using the CKD EPI equation. This calculation has not been validated in all clinical situations. eGFR's persistently <60 mL/min signify possible Chronic Kidney Disease.    Anion gap 5 5 - 15    Comment: Performed at [facility] [address], [place], [place] [address]  Basic metabolic panel     Status: Abnormal   Collection Time: 10/28/17  8:17 AM  Result Value Ref Range   Sodium 135 135 - 145 mmol/L   Potassium 3.9 3.5 - 5.1 mmol/L   Chloride 105 101 - 111 mmol/L   CO2 23 22 - 32 mmol/L   Glucose, Bld 92 65 - 99 mg/dL   BUN <5 (L) 6 - 20 mg/dL   Creatinine, Ser 0.55 0.44 - 1.00 mg/dL   Calcium 7.7 (L) 8.9 - 10.3 mg/dL   GFR calc non Af Amer >60 >60 mL/min   GFR calc Af Amer >60 >60 mL/min    Comment: (NOTE) The eGFR has been calculated using the CKD EPI equation. This calculation has not been validated in all clinical situations. eGFR's persistently <60 mL/min signify possible Chronic Kidney Disease.    Anion gap 7 5 - 15    Comment: Performed at [facility] [address] [place], [place] [address]   Ir Fluoro Rm  30-60 Min  Result Date: 10/28/2017 CLINICAL DATA:  Oropharyngeal carcinoma. Needs enteral feeding support. EXAM: IR FLOURO RM 0-60 MIN COMPARISON:  CT 10/25/2017 TECHNIQUE: The procedure, risks (including but not limited to bleeding, infection, organ damage ), Benefits, and alternatives were explained to the patient. Questions regarding the procedure were encouraged and answered. The patient understands and consents to the procedure.  Upper abdomen prepped and draped in usual sterile fashion. A 5 French angled angiographic catheter was placed as orogastric tube into the stomach and the stomach was insufflated with gas. Under fluoroscopy, it was evident that the transverse colon overlie the stomach, confirmed on bilateral oblique projections. No safe percutaneous access was available. The procedure was therefore terminated. IMPRESSION: Transverse catheter overlies the stomach, preventing safe percutaneous approach for gastrostomy catheter. This represented a different orientation than was seen on earlier CT. Consider surgical consultation for surgical gastrostomy placement. Electronically Signed   By: Lucrezia Europe M.D.   On: 10/28/2017 11:30      Assessment/Plan Active Problems:   Tobacco abuse   Bipolar disorder (HCC)   Chronic pain syndrome   Hypokalemia   Abnormal CT scan, neck   Chest pain   Right ear pain  Open G Tube - will likely take pt to the OR tomorrow  - NPO at midnight  - thank you for the consult   Kalman Drape, [facility] Surgery 10/28/2017, 2:52 PM Pager: [phone] Consults: [phone] Mon-Fri 7:00 am-4:30 pm Sat-Sun 7:00 am-11:30 am

## 2017-10-28 NOTE — Progress Notes (Signed)
PROGRESS NOTE    DILLIE BURANDT  IZT:[id number] DOB: [birth year] DOA: 10/25/2017 PCP: Patient, No Pcp Per   Brief Narrative:  52 year old female with history of COPD, medication noncompliance was recently found to have head and neck cancer on the CT has been having symptoms of progressive dysphagia odynophagia therefore sent to the hospital by Dr. Constance Holster from ENT to be admitted.  In the ER patient was noted to be very dehydrated, malnourished and severely hypokalemic   Assessment & Plan:   Active Problems:   Tobacco abuse   Bipolar disorder (HCC)   Chronic pain syndrome   Hypokalemia   Abnormal CT scan, neck   Chest pain   Right ear pain  Severe protein calorie malnutrition Odynophagia/dysphagia Locally advanced head and neck cancer - Will place nutrition consult - Malnutrition is due to poor p.o. intake from her head and neck cancer.  Plan is to eventually get biopsy of this area by Dr. Constance Holster from ENT when she is little bit more stronger in the meantime we need to get her a PEG tube.  Attempted by IR but unable to get this due to anatomy therefore I will consulted general surgery -Spoke with Dr. Constance Holster over the phone who will plan on performing biopsy when she is medically stronger and also presented to report.  Hopefully patient will remain medically compliant -IV fluids as necessary -Palliative care team consulted  History of chronic pain and polysubstance abuse Tobacco abuse -Nicotine patch as necessary -Provide supportive care and pain control  Urinary tract infection - Urine cultures pending -We will continue Rocephin  Hypothyroidism -Continue Synthroid 25 mcg IV   DVT prophylaxis: SCDs in anticipation for the procedure Code Status: Full code Family Communication: Spoke with patient's POA Paul over the phone Disposition Plan: Maintain inpatient stay for IV fluids  Consultants:   Palliative care  General surgery  Procedures:  None so far  Antimicrobials:    IV Rocephin   Subjective: Patient has burst of verbally abusive behavior and keeps her stating she wants to eat and drink despite of me explaining her that she is n.p.o. in anticipation for the procedure today.   Review of Systems Otherwise negative except as per HPI, including: General: Denies fever, chills, night sweats or unintended weight loss. Resp: Denies cough, wheezing, shortness of breath. Cardiac: Denies chest pain, palpitations, orthopnea, paroxysmal nocturnal dyspnea. GI: Denies abdominal pain, nausea, vomiting, diarrhea or constipation GU: Denies dysuria, frequency, hesitancy or incontinence MS: Denies muscle aches, joint pain or swelling Neuro: Denies headache, neurologic deficits (focal weakness, numbness, tingling), abnormal gait Psych: Denies anxiety, depression, SI/HI/AVH Skin: Denies new rashes or lesions ID: Denies sick contacts, exotic exposures, travel  Objective: Vitals:   10/28/17 1050 10/28/17 1055 10/28/17 1100 10/28/17 1113  BP: (!) 148/87 (!) 142/98 (!) 175/117   Pulse: 88  96   Resp: 10 10 12    Temp:      TempSrc:      SpO2: 92% 95% 96% 92%  Weight:      Height:        Intake/Output Summary (Last 24 hours) at 10/28/2017 1314 Last data filed at 10/27/2017 1400 Gross per 24 hour  Intake 100 ml  Output -  Net 100 ml   Filed Weights   10/25/17 1748 10/26/17 1750  Weight: 36.6 kg (80 lb 9.6 oz) 39.7 kg (87 lb 8 oz)    Examination:  General exam: Appears calm and comfortable, chronically ill-appearing frail with temporal wasting bilaterally.  Clinically  appears dry and has dry mucous membranes. Respiratory system: Clear to auscultation. Respiratory effort normal. Cardiovascular system: S1 & S2 heard, RRR. No JVD, murmurs, rubs, gallops or clicks. No pedal edema. Gastrointestinal system: Abdomen is nondistended, soft and nontender. No organomegaly or masses felt. Normal bowel sounds heard. Central nervous system: Alert and oriented. No focal  neurological deficits. Extremities: Symmetric 5 x 5 power. Skin: No rashes, lesions or ulcers Psychiatry: Judgement and insight appear normal. Mood & affect appropriate.     Data Reviewed:   CBC: Recent Labs  Lab 10/25/17 1404 10/26/17 0450  WBC 8.8 6.6  NEUTROABS 6.6  --   HGB 16.7* 13.7  HCT 46.0 40.4  MCV 91.3 92.2  PLT 234 063   Basic Metabolic Panel: Recent Labs  Lab 10/25/17 1750  10/26/17 0450  10/26/17 1523 10/26/17 1913 10/27/17 0504 10/28/17 0009 10/28/17 0817  NA  --    < > 134*   < > 130* 130* 132* 131* 135  K  --    < > 2.8*   < > 2.8* 2.9* 2.8* 4.1 3.9  CL  --    < > 99*   < > 95* 98* 98* 103 105  CO2  --    < > 23   < > 23 24 24 23 23   GLUCOSE  --    < > 120*   < > 179* 117* 86 103* 92  BUN  --    < > 13   < > 9 8 5* <5* <5*  CREATININE  --    < > 0.75   < > 0.68 0.69 0.72 0.52 0.55  CALCIUM  --    < > 8.0*   < > 8.1* 7.9* 8.1* 7.5* 7.7*  MG 1.8  --  2.1  --   --   --   --   --   --   PHOS 3.0  --  2.8  --   --   --   --   --   --    < > = values in this interval not displayed.   GFR: Estimated Creatinine Clearance: 52.7 mL/min (by C-G formula based on SCr of 0.55 mg/dL). Liver Function Tests: Recent Labs  Lab 10/25/17 1404 10/26/17 0450  AST 24 18  ALT 13* 9*  ALKPHOS 61 46  BILITOT 1.3* 0.9  PROT 7.8 5.8*  ALBUMIN 3.6 2.7*   No results for input(s): LIPASE, AMYLASE in the last 168 hours. No results for input(s): AMMONIA in the last 168 hours. Coagulation Profile: Recent Labs  Lab 10/26/17 1116  INR 0.98   Cardiac Enzymes: Recent Labs  Lab 10/25/17 2356 10/26/17 0450 10/26/17 1116  TROPONINI <0.03 <0.03 <0.03   BNP (last 3 results) No results for input(s): PROBNP in the last 8760 hours. HbA1C: No results for input(s): HGBA1C in the last 72 hours. CBG: No results for input(s): GLUCAP in the last 168 hours. Lipid Profile: No results for input(s): CHOL, HDL, LDLCALC, TRIG, CHOLHDL, LDLDIRECT in the last 72 hours. Thyroid  Function Tests: Recent Labs    10/26/17 0450  TSH 20.490*   Anemia Panel: No results for input(s): VITAMINB12, FOLATE, FERRITIN, TIBC, IRON, RETICCTPCT in the last 72 hours. Sepsis Labs: No results for input(s): PROCALCITON, LATICACIDVEN in the last 168 hours.  Recent Results (from the past 240 hour(s))  Culture, Urine     Status: None   Collection Time: 10/26/17  1:51 AM  Result Value Ref Range Status  Specimen Description URINE, RANDOM  Final   Special Requests NONE  Final   Culture   Final    NO GROWTH Performed at [facility], [place] [address], [place], [place] [address]    Report Status 10/27/2017 FINAL  Final         Radiology Studies: Ir Fluoro Rm 30-60 Min  Result Date: 10/28/2017 CLINICAL DATA:  Oropharyngeal carcinoma. Needs enteral feeding support. EXAM: IR FLOURO RM 0-60 MIN COMPARISON:  CT 10/25/2017 TECHNIQUE: The procedure, risks (including but not limited to bleeding, infection, organ damage ), Benefits, and alternatives were explained to the patient. Questions regarding the procedure were encouraged and answered. The patient understands and consents to the procedure. Upper abdomen prepped and draped in usual sterile fashion. A 5 French angled angiographic catheter was placed as orogastric tube into the stomach and the stomach was insufflated with gas. Under fluoroscopy, it was evident that the transverse colon overlie the stomach, confirmed on bilateral oblique projections. No safe percutaneous access was available. The procedure was therefore terminated. IMPRESSION: Transverse catheter overlies the stomach, preventing safe percutaneous approach for gastrostomy catheter. This represented a different orientation than was seen on earlier CT. Consider surgical consultation for surgical gastrostomy placement. Electronically Signed   By: Lucrezia Europe M.D.   On: 10/28/2017 11:30        Scheduled Meds: . fentaNYL      . folic acid  1 mg Intravenous Daily  .  glucagon (human recombinant)      . iopamidol      . levothyroxine  25 mcg Intravenous Daily  . lidocaine      . midazolam      . nicotine  21 mg Transdermal Daily  . potassium chloride  40 mEq Oral Once  . thiamine  100 mg Intravenous Daily   Continuous Infusions: . cefTRIAXone (ROCEPHIN)  IV 1 g (10/28/17 1207)  . dextrose 5 % and 0.9 % NaCl with KCl 40 mEq/L 75 mL/hr at 10/27/17 2344     LOS: 3 days    Time spent: 35 mins     Ankit Arsenio Loader, MD [facility] Pager [phone]   If 7PM-7AM, please contact night-coverage [email or web address] Password [email or web address] 10/28/2017, 1:14 PM

## 2017-10-29 ENCOUNTER — Encounter (HOSPITAL_COMMUNITY): Payer: Self-pay

## 2017-10-29 ENCOUNTER — Encounter (HOSPITAL_COMMUNITY)
Admission: EM | Discharge status: Against Medical Advice | Payer: Self-pay | Source: Home / Self Care | Attending: Internal Medicine

## 2017-10-29 ENCOUNTER — Inpatient Hospital Stay (HOSPITAL_COMMUNITY): Payer: Medicare Other | Admitting: Certified Registered"

## 2017-10-29 DIAGNOSIS — E43 Unspecified severe protein-calorie malnutrition: Secondary | ICD-10-CM

## 2017-10-29 HISTORY — PX: GASTROSTOMY: SHX5249

## 2017-10-29 LAB — BASIC METABOLIC PANEL
Anion gap: 10 (ref 5–15)
CHLORIDE: 98 mmol/L — AB (ref 101–111)
CO2: 25 mmol/L (ref 22–32)
CREATININE: 0.57 mg/dL (ref 0.44–1.00)
Calcium: 7.7 mg/dL — ABNORMAL LOW (ref 8.9–10.3)
GFR calc Af Amer: >60 mL/min (ref >60)
GFR calc non Af Amer: >60 mL/min (ref >60)
Glucose, Bld: 83 mg/dL (ref 65–99)
Potassium: 3.4 mmol/L — ABNORMAL LOW (ref 3.5–5.1)
Sodium: 133 mmol/L — ABNORMAL LOW (ref 135–145)

## 2017-10-29 LAB — GLUCOSE, CAPILLARY
GLUCOSE-CAPILLARY: 120 mg/dL — AB (ref 65–99)
GLUCOSE-CAPILLARY: 124 mg/dL — AB (ref 65–99)
Glucose-Capillary: 72 mg/dL (ref 65–99)

## 2017-10-29 LAB — SURGICAL PCR SCREEN
MRSA, PCR: NEGATIVE
Staphylococcus aureus: NEGATIVE

## 2017-10-29 LAB — MAGNESIUM: Magnesium: 1.2 mg/dL — ABNORMAL LOW (ref 1.7–2.4)

## 2017-10-29 SURGERY — INSERTION OF GASTROSTOMY TUBE
Anesthesia: General | Site: Abdomen

## 2017-10-29 MED ORDER — MAGNESIUM SULFATE 4 GM/100ML IV SOLN
4.0000 g | Freq: Once | INTRAVENOUS | Status: AC
  Administered 2017-10-29: 4 g via INTRAVENOUS
  Filled 2017-10-29: qty 100

## 2017-10-29 MED ORDER — POTASSIUM CHLORIDE 10 MEQ/100ML IV SOLN
10.0000 meq | INTRAVENOUS | Status: AC
  Administered 2017-10-29: 10 meq via INTRAVENOUS
  Filled 2017-10-29 (×3): qty 100

## 2017-10-29 MED ORDER — MAGNESIUM SULFATE 2 GM/50ML IV SOLN: 2.0000 g | Freq: Once | INTRAVENOUS | Status: DC

## 2017-10-29 MED ORDER — 0.9 % SODIUM CHLORIDE (POUR BTL) OPTIME
TOPICAL | Status: DC | PRN
  Administered 2017-10-29: 1000 mL

## 2017-10-29 MED ORDER — LACTATED RINGERS IV SOLN
INTRAVENOUS | Status: DC
Start: 2017-10-29 — End: 2017-10-29
  Administered 2017-10-29: 10 mL/h via INTRAVENOUS

## 2017-10-29 MED ORDER — POTASSIUM CHLORIDE CRYS ER 20 MEQ PO TBCR: 40.0000 meq | EXTENDED_RELEASE_TABLET | Freq: Once | ORAL | Status: DC

## 2017-10-29 MED ORDER — JEVITY 1.2 CAL PO LIQD
1000.0000 mL | ORAL | Status: DC
  Administered 2017-10-30: 10 mL/h
  Administered 2017-10-31: 1000 mL
  Filled 2017-10-29 (×4): qty 1000

## 2017-10-29 MED ORDER — PHENYLEPHRINE HCL 10 MG/ML IJ SOLN
INTRAMUSCULAR | Status: DC | PRN
  Administered 2017-10-29 (×2): 40 ug via INTRAVENOUS

## 2017-10-29 MED ORDER — HYDROMORPHONE HCL 1 MG/ML IJ SOLN
INTRAMUSCULAR | Status: AC
  Filled 2017-10-29: qty 1

## 2017-10-29 MED ORDER — HYDROMORPHONE HCL 1 MG/ML IJ SOLN
0.2500 mg | INTRAMUSCULAR | Status: DC | PRN
  Administered 2017-10-29 (×3): 0.5 mg via INTRAVENOUS

## 2017-10-29 MED ORDER — PROPOFOL 10 MG/ML IV BOLUS
INTRAVENOUS | Status: DC | PRN
  Administered 2017-10-29: 50 mg via INTRAVENOUS

## 2017-10-29 MED ORDER — POTASSIUM CHLORIDE CRYS ER 20 MEQ PO TBCR: 20.0000 meq | EXTENDED_RELEASE_TABLET | Freq: Once | ORAL | Status: DC

## 2017-10-29 MED ORDER — OXYCODONE HCL 5 MG PO TABS
5.0000 mg | ORAL_TABLET | ORAL | Status: DC | PRN
  Administered 2017-10-29: 5 mg via ORAL
  Administered 2017-10-29 – 2017-10-30 (×2): 10 mg via ORAL
  Administered 2017-10-30: 5 mg via ORAL
  Administered 2017-10-30 – 2017-11-01 (×8): 10 mg via ORAL
  Filled 2017-10-29 (×12): qty 2
  Filled 2017-10-29: qty 1

## 2017-10-29 MED ORDER — PROPOFOL 10 MG/ML IV BOLUS
INTRAVENOUS | Status: AC
  Filled 2017-10-29: qty 20

## 2017-10-29 MED ORDER — SUGAMMADEX SODIUM 500 MG/5ML IV SOLN
INTRAVENOUS | Status: DC | PRN
Start: 2017-10-29 — End: 2017-10-29
  Administered 2017-10-29: 200 mg via INTRAVENOUS

## 2017-10-29 MED ORDER — FENTANYL CITRATE (PF) 100 MCG/2ML IJ SOLN
INTRAMUSCULAR | Status: DC | PRN
  Administered 2017-10-29 (×2): 50 ug via INTRAVENOUS

## 2017-10-29 MED ORDER — POTASSIUM CHLORIDE 10 MEQ/100ML IV SOLN
10.0000 meq | INTRAVENOUS | Status: AC
Start: 2017-10-29 — End: 2017-10-29
  Administered 2017-10-29 (×3): 10 meq via INTRAVENOUS

## 2017-10-29 MED ORDER — LACTATED RINGERS IV SOLN
INTRAVENOUS | Status: DC | PRN
  Administered 2017-10-29: 12:00:00 via INTRAVENOUS

## 2017-10-29 MED ORDER — FENTANYL CITRATE (PF) 250 MCG/5ML IJ SOLN
INTRAMUSCULAR | Status: AC
  Filled 2017-10-29: qty 5

## 2017-10-29 MED ORDER — LIDOCAINE HCL (CARDIAC) 20 MG/ML IV SOLN
INTRAVENOUS | Status: DC | PRN
  Administered 2017-10-29: 40 mg via INTRAVENOUS

## 2017-10-29 MED ORDER — MIDAZOLAM HCL 2 MG/2ML IJ SOLN
INTRAMUSCULAR | Status: AC
  Filled 2017-10-29: qty 2

## 2017-10-29 MED ORDER — MIDAZOLAM HCL 5 MG/5ML IJ SOLN
INTRAMUSCULAR | Status: DC | PRN
  Administered 2017-10-29: 1 mg via INTRAVENOUS

## 2017-10-29 MED ORDER — ONDANSETRON HCL 4 MG/2ML IJ SOLN
INTRAMUSCULAR | Status: DC | PRN
  Administered 2017-10-29: 4 mg via INTRAVENOUS

## 2017-10-29 MED ORDER — DEXAMETHASONE SODIUM PHOSPHATE 10 MG/ML IJ SOLN
INTRAMUSCULAR | Status: DC | PRN
  Administered 2017-10-29: 10 mg via INTRAVENOUS

## 2017-10-29 MED ORDER — ROCURONIUM BROMIDE 100 MG/10ML IV SOLN
INTRAVENOUS | Status: DC | PRN
  Administered 2017-10-29 (×3): 10 mg via INTRAVENOUS

## 2017-10-29 SURGICAL SUPPLY — 32 items
CANISTER SUCT 3000ML PPV (MISCELLANEOUS) ×3 IMPLANT
CATH MALECOT BARD  24FR (CATHETERS) ×2
CATH MALECOT BARD 24FR (CATHETERS) ×1 IMPLANT
CHLORAPREP W/TINT 26ML (MISCELLANEOUS) ×3 IMPLANT
COVER SURGICAL LIGHT HANDLE (MISCELLANEOUS) ×3 IMPLANT
DRAPE LAPAROSCOPIC ABDOMINAL (DRAPES) ×3 IMPLANT
ELECT CAUTERY BLADE 6.4 (BLADE) ×3 IMPLANT
ELECT REM PT RETURN 9FT ADLT (ELECTROSURGICAL) ×3
ELECTRODE REM PT RTRN 9FT ADLT (ELECTROSURGICAL) ×1 IMPLANT
GAUZE SPONGE 4X4 12PLY STRL (GAUZE/BANDAGES/DRESSINGS) ×3 IMPLANT
GLOVE BIO SURGEON STRL SZ8 (GLOVE) ×6 IMPLANT
GLOVE BIOGEL PI IND STRL 8 (GLOVE) ×1 IMPLANT
GLOVE BIOGEL PI INDICATOR 8 (GLOVE) ×2
GOWN STRL REUS W/ TWL LRG LVL3 (GOWN DISPOSABLE) ×1 IMPLANT
GOWN STRL REUS W/ TWL XL LVL3 (GOWN DISPOSABLE) ×1 IMPLANT
GOWN STRL REUS W/TWL LRG LVL3 (GOWN DISPOSABLE) ×2
GOWN STRL REUS W/TWL XL LVL3 (GOWN DISPOSABLE) ×2
KIT BASIN OR (CUSTOM PROCEDURE TRAY) ×3 IMPLANT
KIT ROOM TURNOVER OR (KITS) ×3 IMPLANT
NS IRRIG 1000ML POUR BTL (IV SOLUTION) ×3 IMPLANT
PACK GENERAL/GYN (CUSTOM PROCEDURE TRAY) ×3 IMPLANT
PAD ARMBOARD 7.5X6 YLW CONV (MISCELLANEOUS) ×6 IMPLANT
PLUG CATH AND CAP STER (CATHETERS) IMPLANT
STAPLER VISISTAT 35W (STAPLE) ×3 IMPLANT
SUCTION POOLE TIP (SUCTIONS) IMPLANT
SUT PDS AB 1 CT  36 (SUTURE) ×2
SUT PDS AB 1 CT 36 (SUTURE) ×1 IMPLANT
SUT SILK 2 0 SH (SUTURE) ×6 IMPLANT
SUT SILK 2 0 SH CR/8 (SUTURE) ×3 IMPLANT
SYRINGE TOOMEY DISP (SYRINGE) ×3 IMPLANT
TOWEL OR 17X24 6PK STRL BLUE (TOWEL DISPOSABLE) ×3 IMPLANT
TOWEL OR 17X26 10 PK STRL BLUE (TOWEL DISPOSABLE) ×3 IMPLANT

## 2017-10-29 NOTE — Progress Notes (Signed)
PROGRESS NOTE    Cynthia Church  KZL:[id number] DOB: 1965-08-21 DOA: 10/25/2017 PCP: Patient, No Pcp Per   Brief Narrative:  52 year old female with history of COPD, medication noncompliance was found to have head and neck cancer recently on outpatient CT scan as she was complaining of odynophagia and dysphagia.  She has been followed by Dr. Constance Holster from ENT as outpatient who advised patient to get admitted for PEG tube placement and IV fluids.  interventional radiology attempted to place PEG tube but appeared her colon was in the way therefore the procedure was not performed and general surgery was consulted.   Assessment & Plan:   Active Problems:   Tobacco abuse   Bipolar disorder (HCC)   Chronic pain syndrome   Hypokalemia   Abnormal CT scan, neck   Chest pain   Right ear pain   Protein-calorie malnutrition, severe  Severe protein calorie malnutrition with weight loss Odynophagia and dysphagia Locally advanced head and neck cancer - Nutrition consult has been performed -Unable to get PEG tube placed by interventional radiology therefore a general surgery consulted who plans on placing the PEG tube in the OR today - Eventual plan is for Dr. Constance Holster to perform the biopsy of the mass when she is stronger to get histology/pathology of the tumor type and will be referred to oncology for chemoradiation -Palliative care has been consulted, pending their input -In the meantime continue IV fluids  Hypokalemia and hypomagnesemia - IV repletion has been ordered as patient is n.p.o.  History of chronic pain and polysubstance abuse Tobacco use -Nicotine patch if necessary  Urinary tract infection -Continue Rocephin.  Urine cultures pending  Hypothyroidism -Continue 25 mcg of IV Synthroid    DVT prophylaxis: SCDs Code Status: Full code Family Communication: None at bedside Disposition Plan: Maintain inpatient stay until patient gets the PEG tube placed in the OR in the meantime  still needs IV fluids  Consultants:   Interventional radiology  General surgery  Procedures:   Plans for PEG tube placement in the OR today  Antimicrobials:   Rocephin day 3   Subjective: No complaints per patient this morning besides she continues to ask for oral fluids despite of me telling her why she needs to be n.p.o.  Review of Systems Otherwise negative except as per HPI, including: General: Denies fever, chills, night sweats  Resp: Denies cough, wheezing, shortness of breath. Cardiac: Denies chest pain, palpitations, orthopnea, paroxysmal nocturnal dyspnea. GI: Dysphagia and odynophagia denies abdominal pain, nausea, vomiting, diarrhea or constipation GU: Denies dysuria, frequency, hesitancy or incontinence MS: Denies muscle aches, joint pain or swelling Neuro: Denies headache, neurologic deficits (focal weakness, numbness, tingling), abnormal gait Psych: Denies anxiety, depression, SI/HI/AVH Skin: Denies new rashes or lesions ID: Denies sick contacts, exotic exposures, travel  Objective: Vitals:   10/28/17 1055 10/28/17 1100 10/28/17 1113 10/28/17 2044  BP: (!) 142/98 (!) 175/117  123/82  Pulse:  96  89  Resp: 10 12  16   Temp:    99.1 F (37.3 C)  TempSrc:    Oral  SpO2: 95% 96% 92% 98%  Weight:      Height:        Intake/Output Summary (Last 24 hours) at 10/29/2017 0906 Last data filed at 10/28/2017 1847 Gross per 24 hour  Intake 600 ml  Output -  Net 600 ml   Filed Weights   10/25/17 1748 10/26/17 1750  Weight: 36.6 kg (80 lb 9.6 oz) 39.7 kg (87 lb 8 oz)  Examination:  General exam: Appears calm and comfortable, chronically ill-appearing, frail, bilateral temporal wasting.  Dry mucous membrane. Respiratory system: Clear to auscultation. Respiratory effort normal. Cardiovascular system: S1 & S2 heard, RRR. No JVD, murmurs, rubs, gallops or clicks. No pedal edema. Gastrointestinal system: Abdomen is nondistended, soft and nontender. No  organomegaly or masses felt. Normal bowel sounds heard. Central nervous system: Alert and oriented. No focal neurological deficits. Extremities: Symmetric 5 x 5 power. Skin: No rashes, lesions or ulcers Psychiatry: Judgement and insight appear normal. Mood & affect appropriate.     Data Reviewed:   CBC: Recent Labs  Lab 10/25/17 1404 10/26/17 0450  WBC 8.8 6.6  NEUTROABS 6.6  --   HGB 16.7* 13.7  HCT 46.0 40.4  MCV 91.3 92.2  PLT 234 299   Basic Metabolic Panel: Recent Labs  Lab 10/25/17 1750  10/26/17 0450  10/26/17 1913 10/27/17 0504 10/28/17 0009 10/28/17 0817 10/29/17 0359  NA  --    < > 134*   < > 130* 132* 131* 135 133*  K  --    < > 2.8*   < > 2.9* 2.8* 4.1 3.9 3.4*  CL  --    < > 99*   < > 98* 98* 103 105 98*  CO2  --    < > 23   < > 24 24 23 23 25   GLUCOSE  --    < > 120*   < > 117* 86 103* 92 83  BUN  --    < > 13   < > 8 5* <5* <5* <5*  CREATININE  --    < > 0.75   < > 0.69 0.72 0.52 0.55 0.57  CALCIUM  --    < > 8.0*   < > 7.9* 8.1* 7.5* 7.7* 7.7*  MG 1.8  --  2.1  --   --   --   --   --  1.2*  PHOS 3.0  --  2.8  --   --   --   --   --   --    < > = values in this interval not displayed.   GFR: Estimated Creatinine Clearance: 52.7 mL/min (by C-G formula based on SCr of 0.57 mg/dL). Liver Function Tests: Recent Labs  Lab 10/25/17 1404 10/26/17 0450  AST 24 18  ALT 13* 9*  ALKPHOS 61 46  BILITOT 1.3* 0.9  PROT 7.8 5.8*  ALBUMIN 3.6 2.7*   No results for input(s): LIPASE, AMYLASE in the last 168 hours. No results for input(s): AMMONIA in the last 168 hours. Coagulation Profile: Recent Labs  Lab 10/26/17 1116  INR 0.98   Cardiac Enzymes: Recent Labs  Lab 10/25/17 2356 10/26/17 0450 10/26/17 1116  TROPONINI <0.03 <0.03 <0.03   BNP (last 3 results) No results for input(s): PROBNP in the last 8760 hours. HbA1C: No results for input(s): HGBA1C in the last 72 hours. CBG: Recent Labs  Lab 10/29/17 0408  GLUCAP 72   Lipid  Profile: No results for input(s): CHOL, HDL, LDLCALC, TRIG, CHOLHDL, LDLDIRECT in the last 72 hours. Thyroid Function Tests: No results for input(s): TSH, T4TOTAL, FREET4, T3FREE, THYROIDAB in the last 72 hours. Anemia Panel: No results for input(s): VITAMINB12, FOLATE, FERRITIN, TIBC, IRON, RETICCTPCT in the last 72 hours. Sepsis Labs: No results for input(s): PROCALCITON, LATICACIDVEN in the last 168 hours.  Recent Results (from the past 240 hour(s))  Culture, Urine     Status: None  Collection Time: 10/26/17  1:51 AM  Result Value Ref Range Status   Specimen Description URINE, RANDOM  Final   Special Requests NONE  Final   Culture   Final    NO GROWTH Performed at [facility], [address] [place], [place] [address]    Report Status 10/27/2017 FINAL  Final  Surgical PCR screen     Status: None   Collection Time: 10/28/17  9:18 PM  Result Value Ref Range Status   MRSA, PCR NEGATIVE NEGATIVE Final   Staphylococcus aureus NEGATIVE NEGATIVE Final    Comment: (NOTE) The Xpert SA Assay (FDA approved for NASAL specimens in patients 49 years of age and older), is one component of a comprehensive surveillance program. It is not intended to diagnose infection nor to guide or monitor treatment. Performed at [facility], [place] [address], [place], [place] [address]          Radiology Studies: Ir Fluoro Rm 30-60 Min  Result Date: 10/28/2017 CLINICAL DATA:  Oropharyngeal carcinoma. Needs enteral feeding support. EXAM: IR FLOURO RM 0-60 MIN COMPARISON:  CT 10/25/2017 TECHNIQUE: The procedure, risks (including but not limited to bleeding, infection, organ damage ), Benefits, and alternatives were explained to the patient. Questions regarding the procedure were encouraged and answered. The patient understands and consents to the procedure. Upper abdomen prepped and draped in usual sterile fashion. A 5 French angled angiographic catheter was placed as orogastric tube into  the stomach and the stomach was insufflated with gas. Under fluoroscopy, it was evident that the transverse colon overlie the stomach, confirmed on bilateral oblique projections. No safe percutaneous access was available. The procedure was therefore terminated. IMPRESSION: Transverse catheter overlies the stomach, preventing safe percutaneous approach for gastrostomy catheter. This represented a different orientation than was seen on earlier CT. Consider surgical consultation for surgical gastrostomy placement. Electronically Signed   By: Lucrezia Europe M.D.   On: 10/28/2017 11:30        Scheduled Meds: . folic acid  1 mg Intravenous Daily  . levothyroxine  25 mcg Intravenous Daily  . mupirocin ointment  1 application Nasal BID  . nicotine  21 mg Transdermal Daily  . potassium chloride  40 mEq Oral Once  . thiamine  100 mg Intravenous Daily   Continuous Infusions: . cefTRIAXone (ROCEPHIN)  IV 1 g (10/29/17 0841)  . dextrose 5 % and 0.9 % NaCl with KCl 40 mEq/L 75 mL/hr at 10/27/17 2344  . magnesium sulfate 1 - 4 g bolus IVPB 4 g (10/29/17 0839)  . potassium chloride       LOS: 4 days    Time spent: 30 mins    Amely Voorheis Arsenio Loader, MD [facility] Pager [phone]   If 7PM-7AM, please contact night-coverage [email or web address] Password [email or web address] 10/29/2017, 9:06 AM

## 2017-10-29 NOTE — Progress Notes (Signed)
[facility]/Trauma Progress Note      Assessment/Plan Active Problems:   Tobacco abuse   Bipolar disorder (HCC)   Chronic pain syndrome   Hypokalemia   Abnormal CT scan, neck   Chest pain   Right ear pain  Open G Tube - OR today - continue NPO - replace Mag     LOS: 4 days    Subjective: CC: thirsty  Pt states she is ready to go to the OR now. No acute events overnight. I discussed time of surgery with the pt.   Objective: Vital signs in last 24 hours: Temp:  [99.1 F (37.3 C)] 99.1 F (37.3 C) (03/18 2044) Pulse Rate:  [87-96] 89 (03/18 2044) Resp:  [10-16] 16 (03/18 2044) BP: (121-175)/(82-117) 123/82 (03/18 2044) SpO2:  [92 %-100 %] 98 % (03/18 2044) Last BM Date: 10/25/17  Intake/Output from previous day: 03/18 0701 - 03/19 0700 In: 600 [P.O.:600] Out: -  Intake/Output this shift: No intake/output data recorded.  PE: Gen:  Alert, NAD, pleasant, cooperative Pulm:  Rate and effort normal Abd: Soft, NT/ND, +BS Skin: no rashes noted, warm and dry   Anti-infectives: Anti-infectives (From admission, onward)   Start     Dose/Rate Route Frequency Ordered Stop   10/28/17 1026  ceFAZolin (ANCEF) 2-4 GM/100ML-% IVPB    Comments:  [name]   : cabinet override      10/28/17 1026 10/28/17 1029   10/26/17 1045  cefTRIAXone (ROCEPHIN) 1 g in sodium chloride 0.9 % 100 mL IVPB     1 g 200 mL/hr over 30 Minutes Intravenous Every 24 hours 10/26/17 1036        Lab Results:  No results for input(s): WBC, HGB, HCT, PLT in the last 72 hours. BMET Recent Labs    10/28/17 0817 10/29/17 0359  NA 135 133*  K 3.9 3.4*  CL 105 98*  CO2 23 25  GLUCOSE 92 83  BUN <5* <5*  CREATININE 0.55 0.57  CALCIUM 7.7* 7.7*   PT/INR Recent Labs    10/26/17 1116  LABPROT 12.9  INR 0.98   CMP     Component Value Date/Time   NA 133 (L) 10/29/2017 0359   K 3.4 (L) 10/29/2017 0359   CL 98 (L) 10/29/2017 0359   CO2 25 10/29/2017 0359   GLUCOSE  83 10/29/2017 0359   BUN <5 (L) 10/29/2017 0359   CREATININE 0.57 10/29/2017 0359   CALCIUM 7.7 (L) 10/29/2017 0359   PROT 5.8 (L) 10/26/2017 0450   ALBUMIN 2.7 (L) 10/26/2017 0450   AST 18 10/26/2017 0450   ALT 9 (L) 10/26/2017 0450   ALKPHOS 46 10/26/2017 0450   BILITOT 0.9 10/26/2017 0450   GFRNONAA >60 10/29/2017 0359   GFRAA >60 10/29/2017 0359   Lipase     Component Value Date/Time   LIPASE 34 10/04/2014 1545    Studies/Results: Ir Fluoro Rm 30-60 Min  Result Date: 10/28/2017 CLINICAL DATA:  Oropharyngeal carcinoma. Needs enteral feeding support. EXAM: IR FLOURO RM 0-60 MIN COMPARISON:  CT 10/25/2017 TECHNIQUE: The procedure, risks (including but not limited to bleeding, infection, organ damage ), Benefits, and alternatives were explained to the patient. Questions regarding the procedure were encouraged and answered. The patient understands and consents to the procedure. Upper abdomen prepped and draped in usual sterile fashion. A 5 French angled angiographic catheter was placed as orogastric tube into the stomach and the stomach was insufflated with gas. Under fluoroscopy, it was evident that the transverse colon  overlie the stomach, confirmed on bilateral oblique projections. No safe percutaneous access was available. The procedure was therefore terminated. IMPRESSION: Transverse catheter overlies the stomach, preventing safe percutaneous approach for gastrostomy catheter. This represented a different orientation than was seen on earlier CT. Consider surgical consultation for surgical gastrostomy placement. Electronically Signed   By: Lucrezia Europe M.D.   On: 10/28/2017 11:30      Kalman Drape , [facility] 10/29/2017, 7:47 AM  Pager: [phone] Mon-Wed, Friday 7:00am-4:30pm Thurs 7am-11:30am  Consults: [phone]

## 2017-10-29 NOTE — Progress Notes (Signed)
Nutrition Follow-up  DOCUMENTATION CODES:   Severe malnutrition in context of chronic illness  INTERVENTION:  RD consulted to begin Tubefeeding at 1300 tomorrow. G-tube placed today  Ordered Jevity 1.2, begin at 69mL/hr increase by 10 every 24 hours to goal rate of 42mL/hr  At goal rate provides 1440 calories, 67 grams of protein, and 971mL free water  Monitor Mg, Phos, K+ for at least 3 days, MD replete as necessary. Patient is at high risk for refeeding given poor PO intake and weight loss.  If not receiving IV fluids recommend 161mL free water every 6 hours, providing a total of 1419mL free water.  Recommend obtain new weight  NUTRITION DIAGNOSIS:   Severe Malnutrition related to chronic illness as evidenced by energy intake < or equal to 75% for > or equal to 1 month, moderate muscle depletion, moderate fat depletion, percent weight loss. -ongoing  GOAL:   Patient will meet greater than or equal to 90% of their needs -unmet  MONITOR:   Labs, I & O's, PO intake, Skin, Weight trends, TF tolerance, Diet advancement  ASSESSMENT:   Cynthia Church is a 52 year old lady with medical history significant for but not limited to COPD and abdominal neck CT concerning for head and neck cancer, alcoholism and noncompliance, presenting with progressive dysphagia and odynophagia to the point of not being able to eat.   Patient had G-tube placed today. RD consulted to begin tubefeeding at 1300. RD will continue to monitor and follow.  Labs reviewed:  K+ 3.4, Na 133, Mg 1.2  Medications reviewed and include:  Thiamine 100mg  IV D5 NS 40K+ at 73mL/hr --> 306 calories  Diet Order:  Diet NPO time specified Diet clear liquid Room service appropriate? Yes; Fluid consistency: Thin  EDUCATION NEEDS:   Not appropriate for education at this time  Skin:  Skin Assessment: Reviewed RN Assessment  Last BM:  PTA  Height:   Ht Readings from Last 1 Encounters:  10/26/17 5\' 2"  (1.575 m)     Weight:   Wt Readings from Last 1 Encounters:  10/26/17 87 lb 8 oz (39.7 kg)    Ideal Body Weight:  50 kg  BMI:  Body mass index is 16 kg/m.  Estimated Nutritional Needs:   Kcal:  1400-1600 calories (35-40 cal/kg)  Protein:  60-67 grams (1.5-1.7g/kg)  Fluid:  >1.5L  Satira Anis. Pilot Prindle, MS, RD LDN Inpatient Clinical Dietitian Pager [phone]

## 2017-10-29 NOTE — Transfer of Care (Signed)
Immediate Anesthesia Transfer of Care Note  Patient: Cynthia Church  Procedure(s) Performed: INSERTION OF GASTROSTOMY TUBE (N/A Abdomen)  Patient Location: PACU  Anesthesia Type:General  Level of Consciousness: awake and oriented  Airway & Oxygen Therapy: Patient Spontanous Breathing  Post-op Assessment: Report given to RN and Post -op Vital signs reviewed and stable  Post vital signs: Reviewed and stable  Last Vitals:  Vitals:   10/28/17 1113 10/28/17 2044  BP:  123/82  Pulse:  89  Resp:  16  Temp:  37.3 C  SpO2: 92% 98%    Last Pain:  Vitals:   10/29/17 0602  TempSrc:   PainSc: Asleep      Patients Stated Pain Goal: 4 (16/10/96 0454)  Complications: No apparent anesthesia complications

## 2017-10-29 NOTE — Op Note (Signed)
10/29/2017  12:32 PM  PATIENT:  Cynthia Church  52 y.o. female  PRE-OPERATIVE DIAGNOSIS:  DYSPHAGIA  POST-OPERATIVE DIAGNOSIS:  DYSPHAGIA  PROCEDURE:  Procedure(s): INSERTION OF GASTROSTOMY TUBE  SURGEON:  Surgeon(s): Georganna Skeans, MD  ASSISTANTS: Jackson Latino, PAC   ANESTHESIA:   general  EBL:  No intake/output data recorded.  BLOOD ADMINISTERED:none  DRAINS: Gastrostomy Tube   SPECIMEN:  No Specimen  DISPOSITION OF SPECIMEN:  N/A  COUNTS:  YES  DICTATION: .Dragon Dictation Procedure in detail: [name] presents for gastrostomy tube placement.  She was identified in the preop holding area.  Informed consent was obtained.  She received intravenous antibiotics earlier this morning.  She was brought to the operating room and general endotracheal anesthesia was administered by the anesthesia staff.  Her abdomen was prepped and draped in a sterile fashion.  We did a timeout procedure.  A limited upper midline incision was made.  Subcutaneous tissues were dissected down revealing the anterior fascia.  This was divided sharply along the midline and the peritoneal cavity was entered under direct vision.  Transverse colon, as noted by radiology, was overlying her stomach.  This was brought down out of the way and an area on the anterior surface of her stomach was chosen to place the G-tube.  2 concentric 2-0 silk pursestring sutures were placed in the gastric wall.  A small incision was made in the left upper quadrant and a 24 French Malecot tube was passed into the abdomen.  A small gastrotomy was made in the center of our concentric pursestring sutures.  The Malecot tube was inserted into the stomach without difficulty.  The 2 silk sutures were tied with the inner most one tied first.  The tube was then withdrawn to the stomach approximated the interior of the abdominal wall.  The silk sutures were used to tack the stomach up to the abdominal wall from the inside.  We then placed  multiple interrupted 2-0 silk sutures to tack the stomach up to the anterior abdominal wall all around the tube.  The tube was then flushed and it flushed easily.  Silk sutures were placed in the skin to secure the tube.  The stomach was inspected and there was no bleeding.  Fascia was closed with running 0 PDS.  Subcutaneous tissues were irrigated and the skin was closed with staples.  A Lacinda Axon was placed in the G-tube.  She tolerated the procedure well.  All counts were correct.  She was taken to recovery in stable condition.  G-tube may be starting tomorrow. PATIENT DISPOSITION:  PACU - hemodynamically stable.   Delay start of Pharmacological VTE agent (>24hrs) due to surgical blood loss or risk of bleeding:  no  Georganna Skeans, MD, MPH, FACS Pager: [phone]  3/19/201912:32 PM

## 2017-10-29 NOTE — Care Management Important Message (Signed)
Important Message  Patient Details  Name: Cynthia Church MRN: [id number] Date of Birth: Feb 15, 1966   Medicare Important Message Given:  Yes    Marny Smethers Montine Circle 10/29/2017, 4:04 PM

## 2017-10-29 NOTE — Anesthesia Postprocedure Evaluation (Signed)
Anesthesia Post Note  Patient: Cynthia Church  Procedure(s) Performed: INSERTION OF GASTROSTOMY TUBE (N/A Abdomen)     Patient location during evaluation: PACU Anesthesia Type: General Level of consciousness: awake and alert Pain management: pain level controlled Vital Signs Assessment: post-procedure vital signs reviewed and stable Respiratory status: spontaneous breathing, nonlabored ventilation and respiratory function stable Cardiovascular status: blood pressure returned to baseline and stable Postop Assessment: no apparent nausea or vomiting Anesthetic complications: no    Last Vitals:  Vitals:   10/29/17 1355 10/29/17 1409  BP:  (!) 145/97  Pulse:  77  Resp:  12  Temp: 36.6 C 36.6 C  SpO2:  95%    Last Pain:  Vitals:   10/29/17 1409  TempSrc: Oral  PainSc:                  Mayia Megill,W. EDMOND

## 2017-10-29 NOTE — Anesthesia Preprocedure Evaluation (Addendum)
Anesthesia Evaluation  Patient identified by MRN, date of birth, ID band Patient awake    Reviewed: Allergy & Precautions, H&P , NPO status , Patient's Chart, lab work & pertinent test results, reviewed documented beta blocker date and time   Airway Mallampati: II  TM Distance: >3 FB Neck ROM: Full    Dental no notable dental hx. (+) Edentulous Upper, Edentulous Lower, Dental Advisory Given   Pulmonary Current Smoker,    Pulmonary exam normal breath sounds clear to auscultation       Cardiovascular hypertension, Pt. on medications and Pt. on home beta blockers  Rhythm:Regular Rate:Normal     Neuro/Psych  Headaches, Depression Bipolar Disorder    GI/Hepatic negative GI ROS, Neg liver ROS,   Endo/Other  Hypothyroidism   Renal/GU negative Renal ROS  negative genitourinary   Musculoskeletal   Abdominal   Peds  Hematology negative hematology ROS (+)   Anesthesia Other Findings   Reproductive/Obstetrics negative OB ROS                            Anesthesia Physical Anesthesia Plan  ASA: II  Anesthesia Plan: General   Post-op Pain Management:    Induction: Intravenous  PONV Risk Score and Plan: 3 and Ondansetron, Dexamethasone and Midazolam  Airway Management Planned: Oral ETT  Additional Equipment:   Intra-op Plan:   Post-operative Plan: Extubation in OR  Informed Consent: I have reviewed the patients History and Physical, chart, labs and discussed the procedure including the risks, benefits and alternatives for the proposed anesthesia with the patient or authorized representative who has indicated his/her understanding and acceptance.   Dental advisory given  Plan Discussed with: CRNA  Anesthesia Plan Comments:         Anesthesia Quick Evaluation

## 2017-10-29 NOTE — Anesthesia Procedure Notes (Addendum)
Procedure Name: Intubation Date/Time: 10/29/2017 11:53 AM Performed by: Babs Bertin, CRNA Pre-anesthesia Checklist: Patient identified, Emergency Drugs available, Suction available and Patient being monitored Patient Re-evaluated:Patient Re-evaluated prior to induction Oxygen Delivery Method: Circle system utilized Preoxygenation: Pre-oxygenation with 100% oxygen Induction Type: IV induction Ventilation: Mask ventilation without difficulty Laryngoscope Size: Glidescope and 3 Grade View: Grade I Tube type: Oral Laser Tube: Cuffed inflated with minimal occlusive pressure - saline Tube size: 6.5 mm Airway Equipment and Method: Stylet Placement Confirmation: ETT inserted through vocal cords under direct vision,  positive ETCO2 and breath sounds checked- equal and bilateral Secured at: 18 cm Tube secured with: Tape Dental Injury: Teeth and Oropharynx as per pre-operative assessment  Difficulty Due To: Difficulty was anticipated Future Recommendations: Recommend- induction with short-acting agent, and alternative techniques readily available Comments: Elective glidescope used given history of carcinoma. Epiglottic edema. Vocal cord involvement as well. Tissue friable. Recommend glidescope for future use.

## 2017-10-30 ENCOUNTER — Ambulatory Visit
Admit: 2017-10-30 | Discharge: 2017-10-30 | Disposition: A | Discharge status: Home / Self Care | Payer: Medicare Other | Attending: Radiation Oncology | Admitting: Radiation Oncology

## 2017-10-30 ENCOUNTER — Encounter (HOSPITAL_COMMUNITY): Payer: Self-pay | Admitting: General Surgery

## 2017-10-30 ENCOUNTER — Other Ambulatory Visit: Payer: Self-pay

## 2017-10-30 DIAGNOSIS — Z515 Encounter for palliative care: Secondary | ICD-10-CM

## 2017-10-30 DIAGNOSIS — K117 Disturbances of salivary secretion: Secondary | ICD-10-CM

## 2017-10-30 DIAGNOSIS — F1494 Cocaine use, unspecified with cocaine-induced mood disorder: Secondary | ICD-10-CM

## 2017-10-30 DIAGNOSIS — J392 Other diseases of pharynx: Secondary | ICD-10-CM

## 2017-10-30 DIAGNOSIS — F1410 Cocaine abuse, uncomplicated: Secondary | ICD-10-CM

## 2017-10-30 DIAGNOSIS — R1319 Other dysphagia: Secondary | ICD-10-CM

## 2017-10-30 LAB — GLUCOSE, CAPILLARY
GLUCOSE-CAPILLARY: 134 mg/dL — AB (ref 65–99)
Glucose-Capillary: 105 mg/dL — ABNORMAL HIGH (ref 65–99)
Glucose-Capillary: 109 mg/dL — ABNORMAL HIGH (ref 65–99)
Glucose-Capillary: 120 mg/dL — ABNORMAL HIGH (ref 65–99)
Glucose-Capillary: 129 mg/dL — ABNORMAL HIGH (ref 65–99)
Glucose-Capillary: 133 mg/dL — ABNORMAL HIGH (ref 65–99)

## 2017-10-30 LAB — BASIC METABOLIC PANEL
ANION GAP: 10 (ref 5–15)
CALCIUM: 7.9 mg/dL — AB (ref 8.9–10.3)
CO2: 25 mmol/L (ref 22–32)
Chloride: 96 mmol/L — ABNORMAL LOW (ref 101–111)
Creatinine, Ser: 0.6 mg/dL (ref 0.44–1.00)
GFR calc Af Amer: >60 mL/min (ref >60)
GFR calc non Af Amer: >60 mL/min (ref >60)
GLUCOSE: 130 mg/dL — AB (ref 65–99)
Potassium: 4.2 mmol/L (ref 3.5–5.1)
Sodium: 131 mmol/L — ABNORMAL LOW (ref 135–145)

## 2017-10-30 LAB — MAGNESIUM: Magnesium: 1.7 mg/dL (ref 1.7–2.4)

## 2017-10-30 MED ORDER — ORAL CARE MOUTH RINSE
15.0000 mL | Freq: Two times a day (BID) | OROMUCOSAL | Status: DC
  Administered 2017-10-30 – 2017-10-31 (×4): 15 mL via OROMUCOSAL

## 2017-10-30 MED ORDER — GUAIFENESIN 100 MG/5ML PO SOLN
15.0000 mL | Freq: Four times a day (QID) | ORAL | Status: DC
  Administered 2017-10-30 – 2017-11-01 (×7): 300 mg via ORAL
  Filled 2017-10-30: qty 15
  Filled 2017-10-30: qty 5
  Filled 2017-10-30 (×6): qty 15

## 2017-10-30 MED ORDER — BISACODYL 5 MG PO TBEC
10.0000 mg | DELAYED_RELEASE_TABLET | Freq: Once | ORAL | Status: AC
  Administered 2017-10-30: 10 mg via ORAL
  Filled 2017-10-30: qty 2

## 2017-10-30 MED ORDER — FREE WATER
100.0000 mL | Freq: Four times a day (QID) | Status: DC
  Administered 2017-10-30: 100 mL

## 2017-10-30 MED ORDER — FREE WATER
200.0000 mL | Freq: Four times a day (QID) | Status: DC
  Administered 2017-10-30 – 2017-10-31 (×4): 200 mL

## 2017-10-30 MED ORDER — JEVITY 1.2 CAL PO LIQD: 1000.0000 mL | ORAL | Status: DC

## 2017-10-30 NOTE — Consult Note (Signed)
Consultation Note Date: 10/30/2017   Patient Name: Cynthia Church  DOB: 27-Nov-1965  MRN: [id number]  Age / Sex: 52 y.o., female  PCP: Patient, No Pcp Per Referring Physician: Debbe Odea, MD  Reason for Consultation: Establishing goals of care and Psychosocial/spiritual support  HPI/Patient Profile: 52 y.o. female   admitted on 10/25/2017 with  with past  medical history significant of alcoholism, bipolar affective disorder, uterine cancer, chronic pain syndrome, drug use including crack cocaine, tobacco abuse,  COPD, hypothyroidism.  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 Severe airway effacement at level of hypopharynx." and necrotic lymphadenopathy.  Seen by Dr Constance Holster as OP who recommended admission for PEG tube placement. She can take sips of liquids but cannot swallow solids.   PEG is now placed and patient faces treatment option decisions, advance directive decisions and anticipatory care needs in a difficult situation heavy with psychosocial issues and addiction.   Clinical Assessment and Goals of Care:  This NP Wadie Lessen reviewed medical records, received report from team, assessed the patient and then meet at the patient's bedside  to discuss diagnosis, prognosis, GOC, EOL wishes disposition and options.  Attempted to contact the listed emergency contact was unable to speak directly, left a message in a return number  This is a difficult situation complicated by dense psychosocial issues.   Concept of  Palliative Care was discussed   The difference between a aggressive medical intervention path  and a palliative comfort care path  for this patient at this time was had.  Values and goals of care important to patient  were attempted to be elicited.  Natural trajectory and expectations at EOL were discussed.  Questions and concerns addressed.   Family encouraged to call with questions or concerns.    PMT will continue to support holistically.   Patient is her own healthcare decision maker and psychiatry did deem her to have capacity today.  She speaks to her friend Lauris Chroman being her healthcare power of attorney but there is no documentation of this.  SUMMARY OF RECOMMENDATIONS   Patient is open to all offered and availably medical interventions to prolong life.   This case will need ongoing support with navigating this medical/social situation.  Code Status/Advance Care Planning:  Full code   Symptoms  Thick throat secretions : Guaifenesin 300 mg every 6 hours  Palliative Prophylaxis:   Aspiration, Frequent Pain Assessment and Oral Care  Additional Recommendations (Limitations, Scope, Preferences):  Full Scope Treatment  Psycho-social/Spiritual:   Desire for further Chaplaincy support:no   Prognosis:   Unable to determine  Discharge Planning: To Be Determined      Primary Diagnoses: Present on Admission: . Hypokalemia . Tobacco abuse . Chronic pain syndrome . Bipolar disorder ([place]) . (Resolved) Abnormal CT scan, neck . Chest pain . Right ear pain  I have reviewed the medical record, interviewed the patient and family, and examined the patient. The following aspects are pertinent.  Past Medical History:  Diagnosis Date  . Alcoholism ([place])   . Bipolar affective disorder, manic ([place]) 1993   [facility]  . Cancer ([place]) 1989   uterine  . Chronic pain   . COPD (chronic obstructive pulmonary disease) ([place])   . Headache(784.0) 09/25/2011  . Hyperthyroidism 1993  . Low back pain   . Mental disorder   . Personality disorders 1993   Social History   Socioeconomic  History  . Marital status: Divorced    Spouse name: None  . Number of children: None  . Years of education: None  . Highest education level: None  Social Needs  . Financial resource strain: None  . Food insecurity - worry: None  . Food insecurity - inability: None  . Transportation needs - medical: None  . Transportation needs - non-medical: None  Occupational History  . None  Tobacco Use  . Smoking status: Current Every Day Smoker    Packs/day: 3.00    Years: 28.00    Pack years: 84.00    Types: Cigarettes  . Smokeless tobacco: Never Used  Substance and Sexual Activity  . Alcohol use: Yes    Comment: daily  . Drug use: Yes    Types: Marijuana, Cocaine    Comment: yesterday  . Sexual activity: No  Other Topics Concern  . None  Social History Narrative  . None   Family History  Problem Relation Age of Onset  . Cirrhosis Father    Scheduled Meds: . feeding supplement (JEVITY 1.2 CAL)  1,000 mL Per Tube Q24H  . folic acid  1 mg Intravenous Daily  . free water  100 mL Per Tube QID  . levothyroxine  25 mcg Intravenous Daily  . mouth rinse  15 mL Mouth Rinse BID  . nicotine  21 mg Transdermal Daily  . thiamine  100 mg Intravenous Daily   Continuous Infusions: PRN Meds:.acetaminophen **OR** acetaminophen, ondansetron **OR** ondansetron (ZOFRAN) IV, oxyCODONE Medications Prior to Admission:  Prior to Admission medications   Medication Sig Start Date End Date Taking? Authorizing Provider  diphenhydrAMINE (BENADRYL) 25 MG tablet Take 1 tablet (25 mg total) by mouth every 6 (six) hours. Patient not taking: Reported on 10/25/2017 01/02/17   Jola Schmidt, MD  famotidine (PEPCID) 20 MG tablet Take 1 tablet (20 mg total) by mouth 2 (two) times daily. Patient not taking: Reported on 10/25/2017 01/02/17   Jola Schmidt, MD  ibuprofen (ADVIL,MOTRIN) 800 MG tablet Take 1 tablet (800 mg total) by mouth every 8 (eight) hours as needed for moderate pain. Patient not taking: Reported on  10/25/2017 01/23/17   Milton Ferguson, MD  levofloxacin (LEVAQUIN) 500 MG tablet Take 1 tablet (500 mg total) by mouth daily. Patient not taking: Reported on 10/25/2017 01/02/17   Jola Schmidt, MD  metoprolol tartrate (LOPRESSOR) 25 MG tablet Take 1 tablet (25 mg total) by mouth 2 (two) times daily. Patient not taking: Reported on 10/25/2017 07/10/16   Kerrie Buffalo, NP  predniSONE (DELTASONE) 10 MG tablet Take 6 tablets (60 mg total) by mouth daily. Patient not taking: Reported on 10/25/2017 01/02/17   Jola Schmidt, MD  traZODone (DESYREL) 50 MG tablet Take 1 tablet (50 mg total) by mouth at bedtime as needed for sleep. Patient not taking: Reported on 08/12/2016 07/10/16   Kerrie Buffalo, NP   No Known Allergies Review of Systems  HENT: Positive for trouble swallowing.     Physical Exam  Constitutional: She appears cachectic. She appears ill.  Cardiovascular: Normal rate, regular rhythm and normal heart sounds.  Pulmonary/Chest: Effort normal and breath sounds normal.  Skin: Skin is warm and dry.    Vital Signs: BP 116/72   Pulse 74   Temp 98.6 F (37 C) (Oral)   Resp 18   Ht 5\' 2"  (1.575 m)   Wt 40.7 kg (89 lb 11.6 oz)   SpO2 96%   BMI 16.41 kg/m  Pain Assessment: 0-10   Pain Score: 10-Worst pain ever   SpO2: SpO2: 96 % O2 Device:SpO2: 96 % O2 Flow Rate: .O2 Flow Rate (L/min): 3 L/min  IO: Intake/output summary:   Intake/Output Summary (Last 24 hours) at 10/30/2017 1405 Last data filed at 10/30/2017 0700 Gross per 24 hour  Intake 0 ml  Output -  Net 0 ml    LBM: Last BM Date: 10/26/17 Baseline Weight: Weight: 36.6 kg (80 lb 9.6 oz) Most recent weight: Weight: 40.7 kg (89 lb 11.6 oz)     Palliative Assessment/Data:   Discussed with nursing   Time In: 1500 Time Out: 1600 Time Total: 60 minutes Greater than 50%  of this time was spent counseling and coordinating care related to the above assessment and plan.  Signed by: Wadie Lessen, NP   Please contact  Palliative Medicine Team phone at [phone] for questions and concerns.  For individual provider: See Shea Evans

## 2017-10-30 NOTE — Progress Notes (Signed)
Patient ID: Cynthia Church, female   DOB: 06/05/1966, 52 y.o.   MRN: [id number] Patient is known to me from my outpatient encounter last week.  We discussed her case at the head and neck tumor board this morning.  Given her social situation, it is not clear what type of treatment and she will be able to tolerate.  We have not obtained a tissue diagnosis yet.  She got her gastrostomy yesterday so could possibly be ready for that early next week.  Based on the imaging, this is not a surgical lesion but may require chemo/radiation.  Medical and radiation oncology are going to evaluate the patient in the next couple of days.

## 2017-10-30 NOTE — Consult Note (Signed)
[facility] Face-to-Face Psychiatry Consult   Reason for Consult:  Capacity evaluation  Referring Physician:  Dr. Wynelle Cleveland Patient Identification: Cynthia Church MRN:  [id number] Principal Diagnosis: Cocaine-induced mood disorder [facility]) Diagnosis:   Patient Active Problem List   Diagnosis Date Noted  . 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 Protein-calorie malnutrition, severe [E43] 10/29/2017  . Chest pain [R07.9] 10/25/2017  . Right ear pain [H92.01] 10/25/2017  . Hypokalemia [E87.6] 10/08/2017  . Polycythemia [D75.1] 10/08/2017  . MDD (major depressive disorder) [F32.9] 07/07/2016  . COPD without exacerbation ([place]) [J44.9] 06/05/2016  . Tobacco abuse [Z72.0] 06/05/2016  . Bipolar disorder ([place]) [F31.9] 06/05/2016  . Chronic pain syndrome [G89.4] 06/05/2016  . 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 Abdominal aortic atherosclerosis ([place]) [I70.0] 06/05/2016  . Hypothyroidism [E03.9] 06/05/2016  . Alcohol dependence ([place]) [F10.20] 09/29/2011    Total Time spent with patient: 1 hour  Subjective:   Cynthia Church is a 52 y.o. female patient admitted for PEG tube placement and IV fluids in the setting of head and neck cancer.  HPI:   Per chart review, patient has a history of head and neck cancer. She was admitted for PEG tube placement and IV fluids. She has been refusing care and requesting to eat PO when it has been explained to her that she must be NPO at this time due to dysphagia. She will be referred to oncology for chemoradiation. She does not appear to understand or appreciate the nature of her condition so psychiatry was consulted for capacity evaluation to make decisions regarding treatment for head and neck cancer.   On interview, Ms. [name] is able to state that she is admitted to the hospital for PEG placement because she cannot swallow food. She has felt ill for the past 6 months and was recently diagnosed with head and neck cancer. She is unsure of her treatment  options for cancer but would like to involve her POA, [name] to help her make decisions regarding treatment. She reports a history of hypothyroidism. She did not take Synthroid because it made her feel "crazy." She elaborates to say that it makes her feel irritable and also has since she restarted this medication in the hospital. She denies SI, HI or AVH. She reports a history of manic symptoms although she does not know the last time she felt this way and was unable to state if it was outside the setting of substance use. She reports that her mood has been "mad as hell" and that she needs something for her "nerves." She reports that she does not want "bullshit" like Thorazine and that Valium has worked well in the past. She reports problems with falling asleep.    Past Psychiatric History: Anxiety, alcohol abuse, cocaine abuse and bipolar disorder.   Risk to Self: Is patient at risk for suicide?: No Risk to Others:  None. Denies HI.  Prior Inpatient Therapy:  Denies  Prior Outpatient Therapy:  Unknown   Past Medical History:  Past Medical History:  Diagnosis Date  . Alcoholism ([place])   . Bipolar affective disorder, manic ([place]) 1993   [facility]  . Cancer ([place]) 1989   uterine  . Chronic pain   . COPD (chronic obstructive pulmonary disease) ([place])   . Headache(784.0) 09/25/2011  . Hyperthyroidism 1993  . Low back pain   . Mental disorder   . Personality disorders 1993    Past Surgical History:  Procedure Laterality Date  . BACK  SURGERY    . BREAST SURGERY  2003   to check for possible cancer  . cauterization of uterine cancer  2008  . GASTROSTOMY N/A 10/29/2017   Procedure: INSERTION OF GASTROSTOMY TUBE;  Surgeon: Georganna Skeans, MD;  Location: [place];  Service: General;  Laterality: N/A;  . IR FLUORO RM 30-60 MIN  10/28/2017  . tubal ligaation    . TUBAL LIGATION     Family History:  Family History  Problem Relation Age of Onset  . Cirrhosis Father    Family  Psychiatric  History: Unknown to patient. Social History:  Social History   Substance and Sexual Activity  Alcohol Use Yes   Comment: daily     Social History   Substance and Sexual Activity  Drug Use Yes  . Types: Marijuana, Cocaine   Comment: yesterday    Social History   Socioeconomic History  . Marital status: Divorced    Spouse name: None  . Number of children: None  . Years of education: None  . Highest education level: None  Social Needs  . Financial resource strain: None  . Food insecurity - worry: None  . Food insecurity - inability: None  . Transportation needs - medical: None  . Transportation needs - non-medical: None  Occupational History  . None  Tobacco Use  . Smoking status: Current Every Day Smoker    Packs/day: 3.00    Years: 28.00    Pack years: 84.00    Types: Cigarettes  . Smokeless tobacco: Never Used  Substance and Sexual Activity  . Alcohol use: Yes    Comment: daily  . Drug use: Yes    Types: Marijuana, Cocaine    Comment: yesterday  . Sexual activity: No  Other Topics Concern  . None  Social History Narrative  . None   Additional Social History: She lives at home with her ex-boyfriend and current boyfriend. She receives disability for medical and psychiatric conditions. She quit drinking 6 months ago. She has a history of heavy use. She denies a history of serious alcohol withdrawals. She reports daily crack cocaine use. She spends up to $100 daily for the past 3 months.     Allergies:  No Known Allergies  Labs:  Results for orders placed or performed during the hospital encounter of 10/25/17 (from the past 48 hour(s))  Surgical PCR screen     Status: None   Collection Time: 10/28/17  9:18 PM  Result Value Ref Range   MRSA, PCR NEGATIVE NEGATIVE   Staphylococcus aureus NEGATIVE NEGATIVE    Comment: (NOTE) The Xpert SA Assay (FDA approved for NASAL specimens in patients 50 years of age and older), is one component of a  comprehensive surveillance program. It is not intended to diagnose infection nor to guide or monitor treatment. Performed at [facility], [place] [address], [place], [place] [address]   Basic metabolic panel     Status: Abnormal   Collection Time: 10/29/17  3:59 AM  Result Value Ref Range   Sodium 133 (L) 135 - 145 mmol/L   Potassium 3.4 (L) 3.5 - 5.1 mmol/L   Chloride 98 (L) 101 - 111 mmol/L   CO2 25 22 - 32 mmol/L   Glucose, Bld 83 65 - 99 mg/dL   BUN <5 (L) 6 - 20 mg/dL   Creatinine, Ser 0.57 0.44 - 1.00 mg/dL   Calcium 7.7 (L) 8.9 - 10.3 mg/dL   GFR calc non Af Amer >60 >60 mL/min  GFR calc Af Amer >60 >60 mL/min    Comment: (NOTE) The eGFR has been calculated using the CKD EPI equation. This calculation has not been validated in all clinical situations. eGFR's persistently <60 mL/min signify possible Chronic Kidney Disease.    Anion gap 10 5 - 15    Comment: Performed at [facility] [address], [place], [place] [address]  Magnesium     Status: Abnormal   Collection Time: 10/29/17  3:59 AM  Result Value Ref Range   Magnesium 1.2 (L) 1.7 - 2.4 mg/dL    Comment: Performed at [facility] [address] [place], [place] [address]  Glucose, capillary     Status: None   Collection Time: 10/29/17  4:08 AM  Result Value Ref Range   Glucose-Capillary 72 65 - 99 mg/dL   Comment 1 QC Due   Glucose, capillary     Status: Abnormal   Collection Time: 10/29/17  4:08 PM  Result Value Ref Range   Glucose-Capillary 120 (H) 65 - 99 mg/dL  Glucose, capillary     Status: Abnormal   Collection Time: 10/29/17  8:18 PM  Result Value Ref Range   Glucose-Capillary 124 (H) 65 - 99 mg/dL  Glucose, capillary     Status: Abnormal   Collection Time: 10/30/17 12:05 AM  Result Value Ref Range   Glucose-Capillary 120 (H) 65 - 99 mg/dL  Glucose, capillary     Status: Abnormal   Collection Time: 10/30/17  4:54 AM  Result Value Ref Range   Glucose-Capillary 109 (H) 65 -  99 mg/dL  Basic metabolic panel     Status: Abnormal   Collection Time: 10/30/17  7:51 AM  Result Value Ref Range   Sodium 131 (L) 135 - 145 mmol/L   Potassium 4.2 3.5 - 5.1 mmol/L   Chloride 96 (L) 101 - 111 mmol/L   CO2 25 22 - 32 mmol/L   Glucose, Bld 130 (H) 65 - 99 mg/dL   BUN <5 (L) 6 - 20 mg/dL   Creatinine, Ser 0.60 0.44 - 1.00 mg/dL   Calcium 7.9 (L) 8.9 - 10.3 mg/dL   GFR calc non Af Amer >60 >60 mL/min   GFR calc Af Amer >60 >60 mL/min    Comment: (NOTE) The eGFR has been calculated using the CKD EPI equation. This calculation has not been validated in all clinical situations. eGFR's persistently <60 mL/min signify possible Chronic Kidney Disease.    Anion gap 10 5 - 15    Comment: Performed at [facility] [address] [place], [place] [address]  Magnesium     Status: None   Collection Time: 10/30/17  7:51 AM  Result Value Ref Range   Magnesium 1.7 1.7 - 2.4 mg/dL    Comment: Performed at [facility] [address] [place], [place] [address]  Glucose, capillary     Status: Abnormal   Collection Time: 10/30/17  7:55 AM  Result Value Ref Range   Glucose-Capillary 133 (H) 65 - 99 mg/dL  Glucose, capillary     Status: Abnormal   Collection Time: 10/30/17 11:48 AM  Result Value Ref Range   Glucose-Capillary 134 (H) 65 - 99 mg/dL    Current Facility-Administered Medications  Medication Dose Route Frequency Provider Last Rate Last Dose  . acetaminophen (TYLENOL) tablet 650 mg  650 mg Oral Q6H PRN Toy Baker, MD       Or  . acetaminophen (TYLENOL) suppository 650  mg  650 mg Rectal Q6H PRN Doutova, Anastassia, MD      . feeding supplement (JEVITY 1.2 CAL) liquid 1,000 mL  1,000 mL Per Tube Q24H Focht, Jessica L, PA 10 mL/hr at 10/30/17 1420 10 mL/hr at 95/63/87 5643  . folic acid injection 1 mg  1 mg Intravenous Daily Doutova, Anastassia, MD   1 mg at 10/30/17 0939  . free water 100 mL  100 mL Per Tube QID Debbe Odea, MD   100 mL at  10/30/17 1418  . guaiFENesin (ROBITUSSIN) 100 MG/5ML solution 300 mg  15 mL Oral Q6H Knox Royalty, NP   300 mg at 10/30/17 1440  . levothyroxine (SYNTHROID, LEVOTHROID) injection 25 mcg  25 mcg Intravenous Daily Benito Mccreedy, MD   25 mcg at 10/30/17 0939  . MEDLINE mouth rinse  15 mL Mouth Rinse BID Debbe Odea, MD   15 mL at 10/30/17 1441  . nicotine (NICODERM CQ - dosed in mg/24 hours) patch 21 mg  21 mg Transdermal Daily Bodenheimer, Charles A, NP   21 mg at 10/30/17 0937  . ondansetron (ZOFRAN) tablet 4 mg  4 mg Oral Q6H PRN Doutova, Anastassia, MD       Or  . ondansetron (ZOFRAN) injection 4 mg  4 mg Intravenous Q6H PRN Doutova, Anastassia, MD   4 mg at 10/26/17 1525  . oxyCODONE (Oxy IR/ROXICODONE) immediate release tablet 5-10 mg  5-10 mg Oral Q4H PRN Focht, Jessica L, PA   10 mg at 10/30/17 1421  . thiamine (B-1) injection 100 mg  100 mg Intravenous Daily Toy Baker, MD   100 mg at 10/30/17 3295    Musculoskeletal: Strength & Muscle Tone: within normal limits Gait & Station: UTA since patient was lying in bed. Patient leans: N/A  Psychiatric Specialty Exam: Physical Exam  Nursing note and vitals reviewed. Constitutional: She is oriented to person, place, and time. She appears well-developed and well-nourished.  HENT:  Head: Normocephalic and atraumatic.  Neck: Normal range of motion.  Respiratory: Effort normal.  Musculoskeletal: Normal range of motion.  Neurological: She is alert and oriented to person, place, and time.  Skin: No rash noted.  Psychiatric: Her speech is normal. Thought content normal. Her affect is labile. She is agitated. Cognition and memory are normal. She expresses impulsivity.    Review of Systems  Gastrointestinal: Positive for abdominal pain, nausea and vomiting.  Psychiatric/Behavioral: Positive for substance abuse. Negative for depression, hallucinations and suicidal ideas. The patient is nervous/anxious and has insomnia.   All  other systems reviewed and are negative.   Blood pressure 116/72, pulse 74, temperature 98.6 F (37 C), temperature source Oral, resp. rate 18, height _0  (1.575 m), weight 40.7 kg (89 lb 11.6 oz), SpO2 96 %.Body mass index is 16.41 kg/m.  General Appearance: Fairly Groomed, middle aged, Caucasian female, who appears older than her stated aged, wearing a hospital gown and lying in bed. NAD.   Eye Contact:  Good  Speech:  Clear and Coherent and Normal Rate  Volume:  Normal  Mood:  "Mad as hell."  Affect:  Labile  Thought Process:  Goal Directed, Linear and Descriptions of Associations: Intact  Orientation:  Full (Time, Place, and Person)  Thought Content:  Logical  Suicidal Thoughts:  No  Homicidal Thoughts:  No  Memory:  Immediate;   Good Recent;   Good Remote;   Good  Judgement:  Fair  Insight:  Fair  Psychomotor Activity:  Normal  Concentration:  Concentration:  Good and Attention Span: Good  Recall:  [employer] of Knowledge:  Fair  Language:  Good  Akathisia:  No  Handed:  Right  AIMS (if indicated):   N/A  Assets:  Desire for Improvement Housing Intimacy Social Support  ADL's:  Intact  Cognition:  WNL  Sleep:   Poor    Assessment:  Cynthia Church is a 52 y.o. female who was admitted for PEG tube placement and IV fluids in the setting of head and neck cancer. She demonstrates capacity to make medical decisions regarding current treatment for head and neck cancer. She would like her POA to assist her as needed. She is able to appreciate the circumstances of her situation and rationally manipulate information. Patient reports anxiety and irritability that is likely attributed to cocaine use/withdrawal. She may benefit from starting Gabapentin for cocaine withdrawal and anxiety.    Treatment Plan Summary: -Patient demonstrates capacity to make medical decisions regarding current treatment related to head and neck cancer. She has a POA, [name] and is agreeable to having  her POA assist as needed. -Start Gabapentin 300 mg TID for anxiety and cocaine withdrawal that may be causing irritability and difficult interactions with patient that can interfere with her care.  -Patient is psychiatrically cleared. Psychiatry will sign off on patient at this time. Please consult psychiatry again as needed.   Disposition: No evidence of imminent risk to self or others at present.   Patient does not meet criteria for psychiatric inpatient admission.  Faythe Dingwall, DO 10/30/2017 3:22 PM

## 2017-10-30 NOTE — Progress Notes (Signed)
[facility]/Trauma Progress Note  1 Day Post-Op   Assessment/Plan Active Problems: Tobacco abuse Bipolar disorder (HCC) Chronic pain syndrome Hypokalemia Abnormal CT scan, neck Chest pain Right ear pain  Open G Tube - S/P insertion of gastrostomy tube, Dr. Grandville Silos, 03/19 - okay to start tube feeds and crushed meds per tube at 1300 today  FEN: clears, okay to advance as tolerated to pureed VTE: SCD's, okay for lovenox but will defer to medicine ID: Rocephin 03/16>> Foley: none Follow up: [facility] 2 weeks  DISPO: okay to start tube feeds and crushed meds per tube at 1300 today. We will follow     LOS: 5 days    Subjective: CC: abdominal pain  No nausea or vomiting. Having flatus. Mild abdominal pain.   Objective: Vital signs in last 24 hours: Temp:  [97.6 F (36.4 C)-98.6 F (37 C)] 98.6 F (37 C) (03/20 0454) Pulse Rate:  [74-87] 74 (03/20 0454) Resp:  [8-25] 18 (03/20 0454) BP: (111-167)/(71-118) 111/71 (03/20 0454) SpO2:  [95 %-100 %] 96 % (03/20 0454) Weight:  [89 lb 11.6 oz (40.7 kg)] 89 lb 11.6 oz (40.7 kg) (03/20 0358) Last BM Date: 10/26/17  Intake/Output from previous day: 03/19 0701 - 03/20 0700 In: 600 [I.V.:600] Out: 10 [Blood:10] Intake/Output this shift: No intake/output data recorded.  PE: Gen:  Alert, NAD, pleasant, cooperative Pulm:  Rate and effort normal Abd: Soft, ND, +BS, G tube site C/D/I, appropriately tender, no guarding Skin: no rashes noted, warm and dry   Anti-infectives: Anti-infectives (From admission, onward)   Start     Dose/Rate Route Frequency Ordered Stop   10/28/17 1026  ceFAZolin (ANCEF) 2-4 GM/100ML-% IVPB    Comments:  [name]   : cabinet override      10/28/17 1026 10/28/17 1029   10/26/17 1045  cefTRIAXone (ROCEPHIN) 1 g in sodium chloride 0.9 % 100 mL IVPB     1 g 200 mL/hr over 30 Minutes Intravenous Every 24 hours 10/26/17 1036        Lab Results:  No results  for input(s): WBC, HGB, HCT, PLT in the last 72 hours. BMET Recent Labs    10/29/17 0359 10/30/17 0751  NA 133* 131*  K 3.4* 4.2  CL 98* 96*  CO2 25 25  GLUCOSE 83 130*  BUN <5* <5*  CREATININE 0.57 0.60  CALCIUM 7.7* 7.9*   PT/INR No results for input(s): LABPROT, INR in the last 72 hours. CMP     Component Value Date/Time   NA 131 (L) 10/30/2017 0751   K 4.2 10/30/2017 0751   CL 96 (L) 10/30/2017 0751   CO2 25 10/30/2017 0751   GLUCOSE 130 (H) 10/30/2017 0751   BUN <5 (L) 10/30/2017 0751   CREATININE 0.60 10/30/2017 0751   CALCIUM 7.9 (L) 10/30/2017 0751   PROT 5.8 (L) 10/26/2017 0450   ALBUMIN 2.7 (L) 10/26/2017 0450   AST 18 10/26/2017 0450   ALT 9 (L) 10/26/2017 0450   ALKPHOS 46 10/26/2017 0450   BILITOT 0.9 10/26/2017 0450   GFRNONAA >60 10/30/2017 0751   GFRAA >60 10/30/2017 0751   Lipase     Component Value Date/Time   LIPASE 34 10/04/2014 1545    Studies/Results: Ir Fluoro Rm 30-60 Min  Result Date: 10/28/2017 CLINICAL DATA:  Oropharyngeal carcinoma. Needs enteral feeding support. EXAM: IR FLOURO RM 0-60 MIN COMPARISON:  CT 10/25/2017 TECHNIQUE: The procedure, risks (including but not limited to bleeding, infection, organ damage ), Benefits, and alternatives  were explained to the patient. Questions regarding the procedure were encouraged and answered. The patient understands and consents to the procedure. Upper abdomen prepped and draped in usual sterile fashion. A 5 French angled angiographic catheter was placed as orogastric tube into the stomach and the stomach was insufflated with gas. Under fluoroscopy, it was evident that the transverse colon overlie the stomach, confirmed on bilateral oblique projections. No safe percutaneous access was available. The procedure was therefore terminated. IMPRESSION: Transverse catheter overlies the stomach, preventing safe percutaneous approach for gastrostomy catheter. This represented a different orientation than was  seen on earlier CT. Consider surgical consultation for surgical gastrostomy placement. Electronically Signed   By: Lucrezia Europe M.D.   On: 10/28/2017 11:30      Kalman Drape , [facility] 10/30/2017, 10:22 AM  Pager: [phone] Mon-Wed, Friday 7:00am-4:30pm Thurs 7am-11:30am  Consults: [phone]

## 2017-10-30 NOTE — Progress Notes (Addendum)
PROGRESS NOTE    Cynthia Church   TKZ:[id number]  DOB: [birth year]  DOA: 10/25/2017 PCP: Patient, No Pcp Per   Brief Narrative:  Cynthia Church  52 y.o. 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 Severe airway effacement at level of hypopharynx." and necrotic lymphadenopathy. She was admitted for work up but left AMA.  Seen by Dr Constance Holster as outpt around 3/12 who recommended admission for PEG tube placement. She can take sips of liquids but cannot swallow solids. K noted to be 2.3    Subjective: Tells me she has not been able to swallow since last night but then states she does not want her clear liquid tray to be removed in case she wants to drink.  ROS: no complaints of nausea, vomiting, constipation diarrhea, cough, dyspnea or dysuria, no anxiety, hallucinations, depression. States that she stopped her psych meds a long time ago because they made her feel bad and then states that the IV Synthroid we have been giving her is making feel the same as her psych meds did. Then she states its making her irritable. The RN reported that she complained of hallucinations but the patient tells me she has not had any.  No other complaints.   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 - will start feeds today-  Nutrition recommendations: Jevity 1.2 at 9mL/hr, increase by 10 every 24 hours to goal rate of 22mL/hr and 150 cc of free water Q 6 hrs- - hydrating with D5 NS at 75 cc/hr currently- will d/c and start free water at 100 CC QID for now - apparently had had significant weight loss due to inability to eat- follow for refeeding syndrome- she is a high risk - Dr Reesa Chew spoken with Dr Constance Holster who plans to preform a biopsy when she is stronger - he plans on discussing her in tumor board - I am getting a capacity eval   Active Problems:  Severe hypokalemia, hypomagnesemia - adequately replaced- following  Hypothyroid - TSH 10.490 on 3/16-  Placed on IV synthroid- recheck TSH tomorrow  Poly substance abuse- - UDS + for Cocaine (used Crack daily), Opiates, Cannabis  - has h/o Tobacco abuse- tells me she smokes 2ppm - on nicotine patch - h/o alcohol abuse- she states she has not been drinking lately due to inability to swallow- cont Thiamine and Folic acid  UTI?  - UA + for WBC but not much bacteria - U culture shows no growth - Ceftriaxone started on 3/16- will stop now    Bipolar disorder? - obtaining a psych eval for capacity today   DVT prophylaxis: ambulating, SCDs Code Status: Full code Family Communication:  Disposition Plan: will need to follow for refeeding syndrome over the next few days- tube feeds are recommended to be advanced only by  10 cc / 24 hrs Consultants:   gen surg  IR Procedures:   PEG Antimicrobials:  Anti-infectives (From admission, onward)   Start     Dose/Rate Route Frequency Ordered Stop   10/28/17 1026  ceFAZolin (ANCEF) 2-4 GM/100ML-% IVPB    Comments:  Cynthia Church   : cabinet override      10/28/17 1026 10/28/17 1029   10/26/17 1045  cefTRIAXone (ROCEPHIN) 1 g in sodium chloride 0.9 % 100 mL IVPB     1 g 200 mL/hr over 30 Minutes Intravenous Every 24 hours 10/26/17 1036          Objective: Vitals:   10/29/17 2204 10/30/17 0358 10/30/17 0454 10/30/17 1049  BP: (!) 133/93  111/71 116/72  Pulse: 75  74   Resp: 18  18   Temp: 98 F (36.7 C)  98.6 F (37 C)   TempSrc: Oral  Oral   SpO2: 96%  96%   Weight:  40.7 kg (89 lb 11.6 oz)    Height:        Intake/Output Summary (Last 24 hours) at 10/30/2017 1204 Last data filed at 10/30/2017 0700 Gross per 24 hour  Intake 600 ml  Output 10 ml  Net 590 ml   Filed Weights   10/25/17 1748 10/26/17 1750 10/30/17 0358  Weight: 36.6 kg (80 lb 9.6 oz) 39.7 kg (87 lb 8 oz) 40.7 kg (89 lb 11.6 oz)    Examination: General exam: Appears comfortable , cachectic  HEENT: PERRLA, oral mucosa moist, no sclera icterus or thrush Respiratory system: Clear to auscultation. Respiratory effort normal. Cardiovascular system: S1 & S2 heard, RRR.  No murmurs  Gastrointestinal system: Abdomen soft, non-tender, nondistended. Normal bowel sound. No organomegaly- PEG tube noted with dressing in place which was not opened Central nervous system: Alert and oriented. No focal neurological deficits. Extremities: No cyanosis, clubbing or edema Skin: No rashes or ulcers Psychiatry:  Abnormal mood and affect, agitated/ irritable     Data Reviewed: I have personally reviewed following labs and imaging studies  CBC: Recent Labs  Lab 10/25/17 1404 10/26/17 0450  WBC 8.8 6.6  NEUTROABS 6.6  --   HGB 16.7* 13.7  HCT 46.0 40.4  MCV 91.3 92.2  PLT 234 824   Basic Metabolic Panel: Recent Labs  Lab 10/25/17 1750  10/26/17 0450  10/27/17 0504 10/28/17 0009 10/28/17 0817 10/29/17 0359 10/30/17 0751  NA  --    < > 134*   < > 132* 131* 135 133* 131*  K  --    < > 2.8*   < > 2.8* 4.1 3.9 3.4* 4.2  CL  --    < > 99*   < > 98* 103 105 98* 96*  CO2  --    < > 23   < > 24 23 23 25 25   GLUCOSE  --    < > 120*   < > 86 103* 92 83 130*  BUN  --    < > 13   < > 5* <5* <5* <5* <5*  CREATININE  --    < > 0.75   < > 0.72 0.52 0.55 0.57  0.60  CALCIUM  --    < > 8.0*   < > 8.1* 7.5* 7.7* 7.7* 7.9*  MG 1.8  --  2.1  --   --   --   --  1.2* 1.7  PHOS 3.0  --  2.8  --   --   --   --   --   --    < > =  values in this interval not displayed.   GFR: Estimated Creatinine Clearance: 54.1 mL/min (by C-G formula based on SCr of 0.6 mg/dL). Liver Function Tests: Recent Labs  Lab 10/25/17 1404 10/26/17 0450  AST 24 18  ALT 13* 9*  ALKPHOS 61 46  BILITOT 1.3* 0.9  PROT 7.8 5.8*  ALBUMIN 3.6 2.7*   No results for input(s): LIPASE, AMYLASE in the last 168 hours. No results for input(s): AMMONIA in the last 168 hours. Coagulation Profile: Recent Labs  Lab 10/26/17 1116  INR 0.98   Cardiac Enzymes: Recent Labs  Lab 10/25/17 2356 10/26/17 0450 10/26/17 1116  TROPONINI <0.03 <0.03 <0.03   BNP (last 3 results) No results for input(s): PROBNP in the last 8760 hours. HbA1C: No results for input(s): HGBA1C in the last 72 hours. CBG: Recent Labs  Lab 10/29/17 2018 10/30/17 0005 10/30/17 0454 10/30/17 0755 10/30/17 1148  GLUCAP 124* 120* 109* 133* 134*   Lipid Profile: No results for input(s): CHOL, HDL, LDLCALC, TRIG, CHOLHDL, LDLDIRECT in the last 72 hours. Thyroid Function Tests: No results for input(s): TSH, T4TOTAL, FREET4, T3FREE, THYROIDAB in the last 72 hours. Anemia Panel: No results for input(s): VITAMINB12, FOLATE, FERRITIN, TIBC, IRON, RETICCTPCT in the last 72 hours. Urine analysis:    Component Value Date/Time   COLORURINE YELLOW 10/26/2017 0151   APPEARANCEUR HAZY (A) 10/26/2017 0151   LABSPEC >1.046 (H) 10/26/2017 0151   PHURINE 7.0 10/26/2017 0151   GLUCOSEU NEGATIVE 10/26/2017 0151   HGBUR SMALL (A) 10/26/2017 0151   BILIRUBINUR NEGATIVE 10/26/2017 0151   KETONESUR 5 (A) 10/26/2017 0151   PROTEINUR NEGATIVE 10/26/2017 0151   UROBILINOGEN 0.2 06/01/2015 1329   NITRITE NEGATIVE 10/26/2017 0151   LEUKOCYTESUR MODERATE (A) 10/26/2017 0151   Sepsis  Labs: @LABRCNTIP (procalcitonin:4,lacticidven:4) ) Recent Results (from the past 240 hour(s))  Culture, Urine     Status: None   Collection Time: 10/26/17  1:51 AM  Result Value Ref Range Status   Specimen Description URINE, RANDOM  Final   Special Requests NONE  Final   Culture   Final    NO GROWTH Performed at [facility], [place] [address], [place], [place] [address]    Report Status 10/27/2017 FINAL  Final  Surgical PCR screen     Status: None   Collection Time: 10/28/17  9:18 PM  Result Value Ref Range Status   MRSA, PCR NEGATIVE NEGATIVE Final   Staphylococcus aureus NEGATIVE NEGATIVE Final    Comment: (NOTE) The Xpert SA Assay (FDA approved for NASAL specimens in patients 17 years of age and older), is one component of a comprehensive surveillance program. It is not intended to diagnose infection nor to guide or monitor treatment. Performed at [facility], [place] [address], [place], [place] [address]          Radiology Studies: No results found.    Scheduled Meds: . feeding supplement (JEVITY 1.2 CAL)  1,000 mL Per Tube Q24H  . folic acid  1 mg Intravenous Daily  . levothyroxine  25 mcg Intravenous Daily  . mupirocin ointment  1 application Nasal BID  . nicotine  21 mg Transdermal Daily  . thiamine  100 mg Intravenous Daily   Continuous Infusions: . cefTRIAXone (ROCEPHIN)  IV Stopped (10/29/17 0911)  . dextrose 5 % and 0.9 % NaCl with KCl 40 mEq/L 75 mL/hr at 10/30/17 0404     LOS: 5 days    Time spent in minutes: [place],  MD [facility] Pager: [email or web address] Password [facility] 10/30/2017, 12:04 PM

## 2017-10-30 NOTE — Consult Note (Signed)
Radiation Oncology         ([phone]) [phone] ________________________________  Initial inpatient Consultation  Name: Cynthia Church MRN: [id number]  Date of Service: 10/31/17 DOB: 1966-08-12  [id number], No Pcp Per  No ref. provider found   REFERRING PHYSICIAN: No ref. provider found  DIAGNOSIS: The primary encounter diagnosis was Neck mass. Diagnoses of Malnutrition ([place]), Mild protein-calorie malnutrition ([place]), and Protein-calorie malnutrition, unspecified severity ([place]) were also pertinent to this visit.    ICD-10-CM   1. Neck mass R22.1   2. Malnutrition ([place]) E46 IR Fluoro Rm 30-60 Min    IR Fluoro Rm 30-60 Min    CANCELED: IR Radiologist Eval & Mgmt    CANCELED: IR Radiologist Eval & Mgmt  3. Mild protein-calorie malnutrition ([facility]) E44.1 IR Fluoro Rm 30-60 Min    IR Fluoro Rm 30-60 Min    CANCELED: IR Radiologist Eval & Mgmt  4. Protein-calorie malnutrition, unspecified severity ([facility]) E46 IR Fluoro Rm 30-60 Min    IR Fluoro Rm 30-60 Min    CANCELED: IR Radiologist Eval & Mgmt    HISTORY OF PRESENT ILLNESS: Cynthia Church is a 52 y.o. female seen at the request of Dr. Reesa Chew.  The patient initially presented to [facility] emergency department on everywhere 26 2019 with complaints of sore throat and progressive difficulty swallowing ongoing for the past 4-6 months.  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  There was felt to be probable infiltrative invasion of the right parapharyngeal, prevertebral and carotid spaces at the level of C2 through C5 as well as necrotic right retropharyngeal, level 2B and level 3 metastatic lymphadenopathy.  Additionally, there were 2-62mm  pulmonary nodules in the upper lobes bilaterally.  Hospital admission for further evaluation was recommended at that time but the patient refused and signed out AMA.  She did follow-up with Dr. Constance Holster, ENT as an outpatient on Tuesday, March 12th and he recommended hospital admission at that time.  She declined, but more recently on Friday, March 15, she presented back to [facility] department due to progressive difficulties with swallowing, throat pain and generalized weakness.  CT C/A/P on admission do not demonstrate any obvious evidence of metastatic disease in the chest, abdomen or pelvis.  She had a G-tube placed on 10/29/17 for nutritional support.  She has not had tissue biopsy.  She states that her pain is better controlled since the time of admission and she appears to be in good spirits today.  Her case was discussed in the multidisciplinary head and neck conference on Wed. 10/02/17 regarding treatment options. We are asked to consult with her to discuss the potential role of radiotherapy in the management of her disease.  Of note, she has a history of bipolar disorder as well as polysubstance abuse including crack/cocaine, cigarettes and alcohol. There is also a strong history of medical non-compliance.  PREVIOUS RADIATION THERAPY: No  PAST MEDICAL HISTORY:  Past Medical History:  Diagnosis Date  . Alcoholism ([place])   . Bipolar affective disorder, manic ([place]) 1993   Dx'd at [facility]  . Cancer ([place]) 1989   uterine  . Chronic pain   . COPD (chronic obstructive pulmonary disease) ([place])   . Headache(784.0) 09/25/2011  . Hyperthyroidism 1993  .  Low back pain   . Mental disorder   . Personality disorders 1993      PAST SURGICAL HISTORY: Past Surgical History:  Procedure Laterality Date  . BACK SURGERY    . BREAST SURGERY  2003   to check for possible cancer  . cauterization of uterine cancer  2008  . GASTROSTOMY N/A 10/29/2017   Procedure: INSERTION OF GASTROSTOMY  TUBE;  Surgeon: Georganna Skeans, MD;  Location: [facility];  Service: General;  Laterality: N/A;  . IR FLUORO RM 30-60 MIN  10/28/2017  . tubal ligaation    . TUBAL LIGATION      FAMILY HISTORY:  Family History  Problem Relation Age of Onset  . Cirrhosis Father     SOCIAL HISTORY:  Social History   Socioeconomic History  . Marital status: Divorced    Spouse name: Not on file  . Number of children: Not on file  . Years of education: Not on file  . Highest education level: Not on file  Occupational History  . Not on file  Social Needs  . Financial resource strain: Not on file  . Food insecurity:    Worry: Not on file    Inability: Not on file  . Transportation needs:    Medical: Not on file    Non-medical: Not on file  Tobacco Use  . Smoking status: Current Every Day Smoker    Packs/day: 3.00    Years: 28.00    Pack years: 84.00    Types: Cigarettes  . Smokeless tobacco: Never Used  Substance and Sexual Activity  . Alcohol use: Yes    Comment: daily  . Drug use: Yes    Types: Marijuana, Cocaine    Comment: yesterday  . Sexual activity: Never  Lifestyle  . Physical activity:    Days per week: Not on file    Minutes per session: Not on file  . Stress: Not on file  Relationships  . Social connections:    Talks on phone: Not on file    Gets together: Not on file    Attends religious service: Not on file    Active member of club or organization: Not on file    Attends meetings of clubs or organizations: Not on file    Relationship status: Not on file  . Intimate partner violence:    Fear of current or ex partner: Not on file    Emotionally abused: Not on file    Physically abused: Not on file    Forced sexual activity: Not on file  Other Topics Concern  . Not on file  Social History Narrative  . Not on file    ALLERGIES: Patient has no known allergies.  MEDICATIONS:  Current Facility-Administered Medications  Medication Dose Route Frequency Provider Last  Rate Last Dose  . acetaminophen (TYLENOL) tablet 650 mg  650 mg Oral Q6H PRN Toy Baker, MD       Or  . acetaminophen (TYLENOL) suppository 650 mg  650 mg Rectal Q6H PRN Doutova, Anastassia, MD      . feeding supplement (JEVITY 1.2 CAL) liquid 1,000 mL  1,000 mL Per Tube Q24H Focht, Jessica L, PA 10 mL/hr at 10/30/17 1420 10 mL/hr at 89/38/10 1751  . folic acid injection 1 mg  1 mg Intravenous Daily Doutova, Anastassia, MD   1 mg at 10/31/17 0258  . free water 200 mL  200 mL Per Tube QID Debbe Odea, MD   200 mL at 10/31/17 0842  .  guaiFENesin (ROBITUSSIN) 100 MG/5ML solution 300 mg  15 mL Oral Q6H Knox Royalty, NP   300 mg at 10/31/17 0837  . levothyroxine (SYNTHROID, LEVOTHROID) injection 25 mcg  25 mcg Intravenous Daily Osei-Bonsu, Iona Beard, MD   25 mcg at 10/31/17 0840  . magnesium sulfate IVPB 4 g 100 mL  4 g Intravenous Once Debbe Odea, MD      . MEDLINE mouth rinse  15 mL Mouth Rinse BID Debbe Odea, MD   15 mL at 10/31/17 0842  . nicotine (NICODERM CQ - dosed in mg/24 hours) patch 21 mg  21 mg Transdermal Daily Bodenheimer, Charles A, NP   21 mg at 10/31/17 0843  . ondansetron (ZOFRAN) tablet 4 mg  4 mg Oral Q6H PRN Doutova, Anastassia, MD       Or  . ondansetron (ZOFRAN) injection 4 mg  4 mg Intravenous Q6H PRN Doutova, Anastassia, MD   4 mg at 10/26/17 1525  . oxyCODONE (Oxy IR/ROXICODONE) immediate release tablet 5-10 mg  5-10 mg Oral Q4H PRN Focht, Jessica L, PA   10 mg at 10/31/17 1326  . thiamine (B-1) injection 100 mg  100 mg Intravenous Daily Doutova, Anastassia, MD   100 mg at 10/31/17 7341    REVIEW OF SYSTEMS:  On review of systems, the patient reports that she is doing well overall. She denies any chest pain, shortness of breath, cough, fevers, chills, or night sweats. She has unintentionally lost 80 lbs over the course of 4-6 months due to progressive dysphagia and inability to eat a normal diet.  At this point, she is unable to swallow any solids and can only  take small sips of liquids. She does have pain in the right side of her throat and neck, worse with swallowing.  She denies any bowel or bladder disturbances, and denies abdominal pain, nausea or vomiting. She denies any new musculoskeletal or joint aches or pains. A complete review of systems is obtained and is otherwise negative.    PHYSICAL EXAM:  Wt Readings from Last 3 Encounters:  10/31/17 99 lb 10.4 oz (45.2 kg)  10/08/17 125 lb (56.7 kg)  01/23/17 125 lb (56.7 kg)   Temp Readings from Last 3 Encounters:  10/31/17 98.2 F (36.8 C) (Oral)  10/08/17 98.1 F (36.7 C) (Oral)  10/08/17 97.7 F (36.5 C) (Oral)   BP Readings from Last 3 Encounters:  10/31/17 (!) 168/95  10/09/17 (!) 138/91  10/08/17 (!) 172/114   Pulse Readings from Last 3 Encounters:  10/31/17 91  10/09/17 77  10/08/17 95   Pain Assessment Pain Score: 10-Worst pain ever/10  In general this is a cachectic appearing caucasian female in no acute distress. She is alert and oriented x4 and appropriate throughout the examination. HEENT reveals that the patient is normocephalic, atraumatic. EOMs are intact. PERRLA. Skin is intact without any evidence of gross lesions. Cardiovascular exam reveals a regular rate and rhythm, no clicks rubs or murmurs are auscultated. There is audible rattling in her throat and upper chest due to increased mucous secretions.  Lungs are clear to auscultation bilaterally. Lymphatic assessment is performed and does not reveal any adenopathy in the cervical, supraclavicular, axillary, or inguinal chains. Abdomen has active bowel sounds in all quadrants and is intact. The abdomen is soft, non tender, non distended. Lower extremities are negative for pretibial pitting edema, deep calf tenderness, cyanosis or clubbing.  KPS = 70  100 - Normal; no complaints; no evidence of disease. 90   -  Able to carry on normal activity; minor signs or symptoms of disease. 80   - Normal activity with effort; some  signs or symptoms of disease. 72   - Cares for self; unable to carry on normal activity or to do active work. 60   - Requires occasional assistance, but is able to care for most of his personal needs. 50   - Requires considerable assistance and frequent medical care. 51   - Disabled; requires special care and assistance. 49   - Severely disabled; hospital admission is indicated although death not imminent. 52   - Very sick; hospital admission necessary; active supportive treatment necessary. 10   - Moribund; fatal processes progressing rapidly. 0     - Dead  Karnofsky DA, Abelmann [place], Craver LS and Burchenal [facility] [phone]) The use of the nitrogen mustards in the palliative treatment of carcinoma: with particular reference to bronchogenic carcinoma Cancer 1 634-56  LABORATORY DATA:  Lab Results  Component Value Date   WBC 6.6 10/26/2017   HGB 13.7 10/26/2017   HCT 40.4 10/26/2017   MCV 92.2 10/26/2017   PLT 195 10/26/2017   Lab Results  Component Value Date   NA 132 (L) 10/31/2017   K 3.5 10/31/2017   CL 96 (L) 10/31/2017   CO2 26 10/31/2017   Lab Results  Component Value Date   ALT 11 (L) 10/31/2017   AST 23 10/31/2017   ALKPHOS 57 10/31/2017   BILITOT 1.0 10/31/2017     RADIOGRAPHY: Dg Neck Soft Tissue  Result Date: 10/08/2017 CLINICAL DATA:  52 y/o  F; 6 months of choking sensation. EXAM: NECK SOFT TISSUES - 1+ VIEW COMPARISON:  03/05/2016 CT cervical spine FINDINGS: Airway effacement in vallecula and hypopharynx may represent debris, mucosal lesion, or mucosal thickening. Moderate cervical spondylosis with large anterior marginal osteophytes at the C5-6 levels. No radiopaque foreign body identified. IMPRESSION: Airway effacement in vallecula and hypopharynx may represent debris, mucosal lesion, or mucosal thickening. Direct visualization recommended. Electronically Signed   By: Kristine Garbe M.D.   On: 10/08/2017 23:26   Ct Soft Tissue Neck W Contrast  Result Date:  10/09/2017 CLINICAL DATA:  52 y/o F; 6 months of sore throat. Abnormal radiographs. EXAM: CT NECK WITH CONTRAST TECHNIQUE: Multidetector CT imaging of the neck was performed using the standard protocol following the bolus administration of intravenous contrast. CONTRAST:  2mL ISOVUE-300 IOPAMIDOL (ISOVUE-300) INJECTION 61% COMPARISON:  03/05/2016 CT cervical spine. 10/08/2017 cervical spine radiographs. FINDINGS: Pharynx and larynx: Diffuse irregular an infiltrative masslike enhancement involving nasopharynx, oropharynx, base of tongue, epiglottis, hypopharynx, supraglottic space, and glottis. In the nasopharynx there is mucosal enhancement along the right lateral and posterior walls of the nasopharynx including fossa of Rosenmuller and there is a 10 mm necrotic right node of Rouviere and right retropharyngeal lymph node at C1 level (series 2, image 22, 28). In the oropharynx mucosal enhancement involves the posterior pharyngeal wall crossing the midline, right lateral pharyngeal wall, retromolar trigone connecting right soft palate to base of tongue via the pterygomandibular raphe, bilateral base of tongue with up to 17 mm invasion on the right (series 2, image 42), and to lesser degree the left lateral oropharyngeal wall. Ill-defined enhancement effacing right prevertebral space, parapharyngeal space, carotid space likely representing local soft tissue invasion from approximately C2-C5 levels. In the hypopharynx there is irregular mucosal thickening of the epiglottis, and bulky masslike enhancement of the mucosal surfaces circumferentially with ulcerations resulting in severe airway effacement. Laryngeal involvement involves true and  false cords, anterior commissure, and paraglottic fat. No definite thyroid cartilage invasion or extra laryngeal extension. Salivary glands: No definite invasion. Thyroid: Normal. Lymph nodes: Necrotic 10 mm right level 3 and 14 x 13 mm necrotic right level 2 B lymphadenopathy  (series 2, image 41 and 52). Vascular: Negative. Limited intracranial: Negative. Visualized orbits: Negative. Mastoids and visualized paranasal sinuses: Clear. Skeleton: No acute or aggressive process. Upper chest: Several 2-3 mm nodules within the upper lobes bilaterally. Other: None. 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 Severe airway effacement at level of hypopharynx. Probable infiltrative invasion of right parapharyngeal, prevertebral, and carotid spaces at approximately C2-C5 levels. Necrotic right retropharyngeal, level 2B, and level 3 metastatic lymphadenopathy. 2-3 mm pulmonary nodules in upper lobes bilaterally. These results were called by telephone at the time of interpretation on 10/09/2017 at 12:37 am to Dr. Nanda Quinton , who verbally acknowledged these results. Electronically Signed   By: Kristine Garbe M.D.   On: 10/09/2017 00:43   Ct Chest W Contrast  Result Date: 10/25/2017 CLINICAL DATA:  Acute onset of generalized chest and epigastric pain. Recent weight loss. Current history of head and neck malignancy. EXAM: CT CHEST, ABDOMEN, AND PELVIS WITH CONTRAST TECHNIQUE: Multidetector CT imaging of the chest, abdomen and pelvis was performed following the standard protocol during bolus administration of intravenous contrast. CONTRAST:  173mL ISOVUE-300 IOPAMIDOL (ISOVUE-300) INJECTION 61% COMPARISON:  Chest radiograph performed 01/23/2017, and CT of the abdomen and pelvis performed 03/05/2016 FINDINGS: CT CHEST FINDINGS Cardiovascular: The heart is normal in size. The thoracic aorta is unremarkable. The great vessels are within normal limits. Mediastinum/Nodes: The mediastinum is unremarkable in appearance. No mediastinal lymphadenopathy is seen. The thyroid gland is grossly unremarkable in  appearance. No axillary lymphadenopathy is seen. Lungs/Pleura: A tiny peripheral opacity at the right middle lobe is thought to reflect atelectasis. A calcified granuloma is noted at the left lung base. No focal consolidation, pleural effusion or pneumothorax is seen. No dominant mass is identified. Musculoskeletal: No acute osseous abnormalities are identified. The visualized musculature is unremarkable in appearance. Enhancement anterior to the trachea at the level of the neck is thought to reflect prior procedure in this region. CT ABDOMEN PELVIS FINDINGS Hepatobiliary: The liver is unremarkable in appearance. The gallbladder is unremarkable in appearance. The common bile duct remains normal in caliber. Pancreas: The pancreas is within normal limits. Spleen: The spleen is unremarkable in appearance. Adrenals/Urinary Tract: The adrenal glands are unremarkable in appearance. The kidneys are within normal limits. There is no evidence of hydronephrosis. No renal or ureteral stones are identified. No perinephric stranding is seen. Stomach/Bowel: The stomach is unremarkable in appearance. The small bowel is within normal limits. The appendix is not visualized; there is no evidence for appendicitis. The colon is unremarkable in appearance. Mild soft tissue inflammation is noted along the left paracolic gutter, of uncertain significance. This could reflect an omental infarct. Vascular/Lymphatic: Scattered calcification is seen along the abdominal aorta and its branches. The abdominal aorta is otherwise grossly unremarkable. The inferior vena cava is grossly unremarkable. No retroperitoneal lymphadenopathy is seen. No pelvic sidewall lymphadenopathy is identified. Reproductive: The bladder is mildly distended and grossly unremarkable. The uterus is grossly unremarkable. No suspicious adnexal masses are seen. Other: No additional soft tissue abnormalities are seen. Musculoskeletal: No acute osseous abnormalities are  identified. The visualized musculature is unremarkable in appearance. IMPRESSION: 1. Mild  soft tissue inflammation along the left paracolic gutter, of uncertain significance. This could reflect an omental infarct. 2. No evidence of metastatic disease to the chest, abdomen or pelvis. Aortic Atherosclerosis (ICD10-I70.0). Electronically Signed   By: Garald Balding M.D.   On: 10/25/2017 22:47   Ct Abdomen Pelvis W Contrast  Result Date: 10/25/2017 CLINICAL DATA:  Acute onset of generalized chest and epigastric pain. Recent weight loss. Current history of head and neck malignancy. EXAM: CT CHEST, ABDOMEN, AND PELVIS WITH CONTRAST TECHNIQUE: Multidetector CT imaging of the chest, abdomen and pelvis was performed following the standard protocol during bolus administration of intravenous contrast. CONTRAST:  14mL ISOVUE-300 IOPAMIDOL (ISOVUE-300) INJECTION 61% COMPARISON:  Chest radiograph performed 01/23/2017, and CT of the abdomen and pelvis performed 03/05/2016 FINDINGS: CT CHEST FINDINGS Cardiovascular: The heart is normal in size. The thoracic aorta is unremarkable. The great vessels are within normal limits. Mediastinum/Nodes: The mediastinum is unremarkable in appearance. No mediastinal lymphadenopathy is seen. The thyroid gland is grossly unremarkable in appearance. No axillary lymphadenopathy is seen. Lungs/Pleura: A tiny peripheral opacity at the right middle lobe is thought to reflect atelectasis. A calcified granuloma is noted at the left lung base. No focal consolidation, pleural effusion or pneumothorax is seen. No dominant mass is identified. Musculoskeletal: No acute osseous abnormalities are identified. The visualized musculature is unremarkable in appearance. Enhancement anterior to the trachea at the level of the neck is thought to reflect prior procedure in this region. CT ABDOMEN PELVIS FINDINGS Hepatobiliary: The liver is unremarkable in appearance. The gallbladder is unremarkable in appearance.  The common bile duct remains normal in caliber. Pancreas: The pancreas is within normal limits. Spleen: The spleen is unremarkable in appearance. Adrenals/Urinary Tract: The adrenal glands are unremarkable in appearance. The kidneys are within normal limits. There is no evidence of hydronephrosis. No renal or ureteral stones are identified. No perinephric stranding is seen. Stomach/Bowel: The stomach is unremarkable in appearance. The small bowel is within normal limits. The appendix is not visualized; there is no evidence for appendicitis. The colon is unremarkable in appearance. Mild soft tissue inflammation is noted along the left paracolic gutter, of uncertain significance. This could reflect an omental infarct. Vascular/Lymphatic: Scattered calcification is seen along the abdominal aorta and its branches. The abdominal aorta is otherwise grossly unremarkable. The inferior vena cava is grossly unremarkable. No retroperitoneal lymphadenopathy is seen. No pelvic sidewall lymphadenopathy is identified. Reproductive: The bladder is mildly distended and grossly unremarkable. The uterus is grossly unremarkable. No suspicious adnexal masses are seen. Other: No additional soft tissue abnormalities are seen. Musculoskeletal: No acute osseous abnormalities are identified. The visualized musculature is unremarkable in appearance. IMPRESSION: 1. Mild soft tissue inflammation along the left paracolic gutter, of uncertain significance. This could reflect an omental infarct. 2. No evidence of metastatic disease to the chest, abdomen or pelvis. Aortic Atherosclerosis (ICD10-I70.0). Electronically Signed   By: Garald Balding M.D.   On: 10/25/2017 22:47   Ir Fluoro Rm 30-60 Min  Result Date: 10/28/2017 CLINICAL DATA:  Oropharyngeal carcinoma. Needs enteral feeding support. EXAM: IR FLOURO RM 0-60 MIN COMPARISON:  CT 10/25/2017 TECHNIQUE: The procedure, risks (including but not limited to bleeding, infection, organ damage ),  Benefits, and alternatives were explained to the patient. Questions regarding the procedure were encouraged and answered. The patient understands and consents to the procedure. Upper abdomen prepped and draped in usual sterile fashion. A 5 French angled angiographic catheter was placed as orogastric tube into the stomach and the stomach was  insufflated with gas. Under fluoroscopy, it was evident that the transverse colon overlie the stomach, confirmed on bilateral oblique projections. No safe percutaneous access was available. The procedure was therefore terminated. IMPRESSION: Transverse catheter overlies the stomach, preventing safe percutaneous approach for gastrostomy catheter. This represented a different orientation than was seen on earlier CT. Consider surgical consultation for surgical gastrostomy placement. Electronically Signed   By: Lucrezia Europe M.D.   On: 10/28/2017 11:30      IMPRESSION/PLAN: 1. 52 y.o. 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 Her case was presented at the multidisciplinary head and neck tumor board on 10/30/2017.  She is not felt to be a surgical candidate but would potentially benefit from concurrent chemoradiotherapy.  She does have multiple social barriers to care and a history of medical noncompliance so, realistically, treatment options may be limited.  We discussed the natural history of oropharyngeal carcinoma and general treatment, highlighting the role of radiotherapy in the management. We briefly discussed the available radiation techniques, and focused on the details of logistics and delivery. She lives in [place], [place] and would prefer to have treatment closer to home at [facility] and/or [facility] if chemoradiation is felt appropriate.  She  is very clear that she does not have reliable transportation for daily treatments so radiotherapy may not be a feasible option for her given the high probability of non-compliance for this reason. Dr. Lebron Conners will be seeing this patient today as well and we will await his recommendations prior to moving forward with formal treatment recommendations.  We reviewed the anticipated acute and late sequelae associated with radiation in this setting and she was encouraged to ask questions that were answered to her satisfaction. At minimum, she needs tissue diagnosis, ideally while she is in-house, as this will help guide treatment planning.  She will also need a PET scan as outpatient to complete her disease staging.  If chemoradiation is felt appropriate and patient is agreeable, she will need to be connected with medical oncology and radiation oncology specialists closer to her home in [place], [place] as she has made it clear that daily treatments in [place] is not an option.     Nicholos Johns, PA-C

## 2017-10-30 NOTE — Progress Notes (Signed)
Told by NT that patient was wanting to leave AMA. Went to room to check on patient and she was naked sitting up in bed with two men in the room. The older gentleman was sitting in a wheelchair cursing at her and told her that she was not leaving and that he was not going to take her home. Patient was demanding that I take her IV out or she was going to pull it out herself. She tried twice to pull out her tube feeding. After talking with patient for a few minutes, she finally calmed down and agreed to put her clothes on and lay back in the bed. Patient had labored breathing related to being so upset, crackles could be heard. Told her to lay back and rest for a little while and that we would check back on her shortly.

## 2017-10-30 NOTE — Progress Notes (Signed)
Patient requesting information regarding medications she is receiving while here in the hospital, when reeducated about levothyroxine patient reported having hallucinations, "flies all over my room last night", provider made aware.

## 2017-10-31 ENCOUNTER — Encounter: Payer: Self-pay | Admitting: *Deleted

## 2017-10-31 ENCOUNTER — Inpatient Hospital Stay (HOSPITAL_COMMUNITY): Payer: Medicare Other

## 2017-10-31 DIAGNOSIS — K117 Disturbances of salivary secretion: Secondary | ICD-10-CM

## 2017-10-31 DIAGNOSIS — E46 Unspecified protein-calorie malnutrition: Secondary | ICD-10-CM

## 2017-10-31 DIAGNOSIS — R1319 Other dysphagia: Secondary | ICD-10-CM

## 2017-10-31 DIAGNOSIS — C140 Malignant neoplasm of pharynx, unspecified: Secondary | ICD-10-CM

## 2017-10-31 DIAGNOSIS — Z515 Encounter for palliative care: Secondary | ICD-10-CM

## 2017-10-31 DIAGNOSIS — J392 Other diseases of pharynx: Secondary | ICD-10-CM

## 2017-10-31 DIAGNOSIS — F17218 Nicotine dependence, cigarettes, with other nicotine-induced disorders: Secondary | ICD-10-CM

## 2017-10-31 DIAGNOSIS — R221 Localized swelling, mass and lump, neck: Secondary | ICD-10-CM

## 2017-10-31 DIAGNOSIS — F1494 Cocaine use, unspecified with cocaine-induced mood disorder: Secondary | ICD-10-CM

## 2017-10-31 LAB — GLUCOSE, CAPILLARY
GLUCOSE-CAPILLARY: 115 mg/dL — AB (ref 65–99)
Glucose-Capillary: 102 mg/dL — ABNORMAL HIGH (ref 65–99)
Glucose-Capillary: 112 mg/dL — ABNORMAL HIGH (ref 65–99)
Glucose-Capillary: 84 mg/dL (ref 65–99)

## 2017-10-31 LAB — MAGNESIUM: Magnesium: 1.5 mg/dL — ABNORMAL LOW (ref 1.7–2.4)

## 2017-10-31 LAB — COMPREHENSIVE METABOLIC PANEL
ALT: 11 U/L — ABNORMAL LOW (ref 14–54)
ANION GAP: 10 (ref 5–15)
AST: 23 U/L (ref 15–41)
Albumin: 2.7 g/dL — ABNORMAL LOW (ref 3.5–5.0)
Alkaline Phosphatase: 57 U/L (ref 38–126)
BUN: <5 mg/dL — ABNORMAL LOW (ref 6–20)
CHLORIDE: 96 mmol/L — AB (ref 101–111)
CO2: 26 mmol/L (ref 22–32)
Calcium: 8.3 mg/dL — ABNORMAL LOW (ref 8.9–10.3)
Creatinine, Ser: 0.62 mg/dL (ref 0.44–1.00)
Glucose, Bld: 103 mg/dL — ABNORMAL HIGH (ref 65–99)
Potassium: 3.5 mmol/L (ref 3.5–5.1)
Sodium: 132 mmol/L — ABNORMAL LOW (ref 135–145)
Total Bilirubin: 1 mg/dL (ref 0.3–1.2)
Total Protein: 6.3 g/dL — ABNORMAL LOW (ref 6.5–8.1)

## 2017-10-31 LAB — PHOSPHORUS: PHOSPHORUS: 3.2 mg/dL (ref 2.5–4.6)

## 2017-10-31 LAB — TSH: TSH: 26.444 u[IU]/mL — AB (ref 0.350–4.500)

## 2017-10-31 MED ORDER — POTASSIUM CHLORIDE 20 MEQ/15ML (10%) PO SOLN
40.0000 meq | Freq: Once | ORAL | Status: AC
  Administered 2017-10-31: 40 meq via ORAL
  Filled 2017-10-31: qty 30

## 2017-10-31 MED ORDER — FREE WATER
300.0000 mL | Freq: Four times a day (QID) | Status: DC
  Administered 2017-10-31 (×2): 300 mL

## 2017-10-31 MED ORDER — LEVOTHYROXINE SODIUM 50 MCG PO TABS
50.0000 ug | ORAL_TABLET | Freq: Every day | ORAL | Status: DC
  Filled 2017-10-31: qty 1

## 2017-10-31 MED ORDER — MAGNESIUM SULFATE 4 GM/100ML IV SOLN
4.0000 g | Freq: Once | INTRAVENOUS | Status: AC
Start: 2017-10-31 — End: 2017-10-31
  Administered 2017-10-31: 4 g via INTRAVENOUS
  Filled 2017-10-31: qty 100

## 2017-10-31 NOTE — Progress Notes (Signed)
Oncology Nurse Navigator Documentation  Visited New Referral pt Ms Job with [address] [place].  Introduced self as the H&N oncology nurse navigator that works with Dr. Isidore Moos and Dr. Lebron Conners to whom she has been referred by Dr. Constance Holster, provided contact information.   I briefly explained my role as her navigator, indicated I would be supporting her if she proceeds with tmt at [facility].  She commented preference for tmt in [place] since closer to her home.  Encouraged her to call me with questions/concerns.  Gayleen Orem, RN, BSN Head & Neck Oncology Nurse [place] [facility] [place] [phone]

## 2017-10-31 NOTE — Final Consult Note (Signed)
[facility] Progress Note  2 Days Post-Op  Subjective: CC: abdominal pain  Patient reports abdominal pain but states pain improved with medication. Tolerating TF and did teaching with RN this AM for medication administration via G tube. Patient wants puree diet. Passing flatus. VSS.   Objective: Vital signs in last 24 hours: Temp:  [98.2 F (36.8 C)-98.6 F (37 C)] 98.2 F (36.8 C) (03/21 0417) Pulse Rate:  [91-99] 91 (03/21 0417) Resp:  [18] 18 (03/21 0417) BP: (116-168)/(72-95) 168/95 (03/21 0417) SpO2:  [87 %-91 %] 87 % (03/21 0417) Weight:  [45.2 kg (99 lb 10.4 oz)] 45.2 kg (99 lb 10.4 oz) (03/21 0439) Last BM Date: 09/28/17  Intake/Output from previous day: 03/20 0701 - 03/21 0700 In: 83.2 [NG/GT:83.2] Out: -  Intake/Output this shift: No intake/output data recorded.  PE: Gen: Alert, NAD, cooperative Pulm:Rate andeffort normal Abd: Soft, ND, +BS, G tube site C/D/I, appropriately tender, no guarding Skin: no rashes noted, warm and dry  Lab Results:  No results for input(s): WBC, HGB, HCT, PLT in the last 72 hours. BMET Recent Labs    10/30/17 0751 10/31/17 0639  NA 131* 132*  K 4.2 3.5  CL 96* 96*  CO2 25 26  GLUCOSE 130* 103*  BUN <5* <5*  CREATININE 0.60 0.62  CALCIUM 7.9* 8.3*   PT/INR No results for input(s): LABPROT, INR in the last 72 hours. CMP     Component Value Date/Time   NA 132 (L) 10/31/2017 0639   K 3.5 10/31/2017 0639   CL 96 (L) 10/31/2017 0639   CO2 26 10/31/2017 0639   GLUCOSE 103 (H) 10/31/2017 0639   BUN <5 (L) 10/31/2017 0639   CREATININE 0.62 10/31/2017 0639   CALCIUM 8.3 (L) 10/31/2017 0639   PROT 6.3 (L) 10/31/2017 0639   ALBUMIN 2.7 (L) 10/31/2017 0639   AST 23 10/31/2017 0639   ALT 11 (L) 10/31/2017 0639   ALKPHOS 57 10/31/2017 0639   BILITOT 1.0 10/31/2017 0639   GFRNONAA >60 10/31/2017 0639   GFRAA >60 10/31/2017 0639   Lipase     Component Value Date/Time   LIPASE 34 10/04/2014 1545        Studies/Results: No results found.  Anti-infectives: Anti-infectives (From admission, onward)   Start     Dose/Rate Route Frequency Ordered Stop   10/28/17 1026  ceFAZolin (ANCEF) 2-4 GM/100ML-% IVPB    Note to Pharmacy:  [name]   : cabinet override      10/28/17 1026 10/28/17 1029   10/26/17 1045  cefTRIAXone (ROCEPHIN) 1 g in sodium chloride 0.9 % 100 mL IVPB  Status:  Discontinued     1 g 200 mL/hr over 30 Minutes Intravenous Every 24 hours 10/26/17 1036 10/30/17 1211       Assessment/Plan Active Problems: Tobacco abuse Bipolar disorder (HCC) Chronic pain syndrome Hypokalemia Abnormal CT scan, neck Chest pain Right ear pain  Open G Tube -S/P insertion of gastrostomy tube, Dr. Grandville Silos, 03/19 - tolerating TF and medications via G tube - can advance to puree diet from a general surgery standpoint  FEN: clears, okay to advance as tolerated to pureed VTE: SCD's, okay for lovenox but will defer to medicine ID: Rocephin 03/16>3/18 Foley: none Follow up: [place] [facility] 2 weeks  DISPO: Patient will need HH for G tube care/teaching. Follow up in [place] office 2 weeks. Advance TF to goal as tolerated. We will sign off, call with questions or concerns.     LOS: 6 days  Brigid Re , [facility] Surgery 10/31/2017, 9:14 AM Pager: [phone] Consults: [phone] Mon-Fri 7:00 am-4:30 pm Sat-Sun 7:00 am-11:30 am

## 2017-10-31 NOTE — Progress Notes (Signed)
CSW unsure what resources are available for patient at discharge. CSW will conduct SNF search, but unsure if PEG is enough to skill patient. Additional barriers include substance use.   Cynthia Locus Kolbee Bogusz LCSW [phone]

## 2017-10-31 NOTE — NC FL2 (Signed)
[place] MEDICAID FL2 LEVEL OF CARE SCREENING TOOL     IDENTIFICATION  Patient Name: Cynthia Church Birthdate: [birth year] Sex: female Admission Date (Current Location): 10/25/2017  [facility] and [place] Number:  Herbalist and Address:  [facility], [place] [address] [place], [place] [address]      Provider Number: [id number]  Attending Physician Name and Address:  Debbe Odea, MD  Relative Name and Phone Number:       Current Level of Care: Hospital Recommended Level of Care: [place] Prior Approval Number:    Date Approved/Denied:   PASRR Number:    Discharge Plan: SNF    Current Diagnoses: Patient Active Problem List   Diagnosis Date Noted  . 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 Cocaine-induced mood disorder ([place])   . Protein-calorie malnutrition, severe 10/29/2017  . Chest pain 10/25/2017  . Right ear pain 10/25/2017  . Hypokalemia 10/08/2017  . Polycythemia 10/08/2017  . MDD (major depressive disorder) 07/07/2016  . COPD without exacerbation ([place]) 06/05/2016  . Tobacco abuse 06/05/2016  . Bipolar disorder ([place]) 06/05/2016  . Chronic pain syndrome 06/05/2016  . 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 Abdominal aortic atherosclerosis ([place]) 06/05/2016  . Hypothyroidism 06/05/2016  . Alcohol dependence ([place]) 09/29/2011    Orientation RESPIRATION BLADDER Height & Weight     Self, Time, Situation, Place  Normal Continent Weight: 45.2 kg (99 lb 10.4 oz) Height:  5\' 2"  (157.5 cm)  BEHAVIORAL SYMPTOMS/MOOD NEUROLOGICAL BOWEL NUTRITION STATUS      Continent Feeding tube  AMBULATORY STATUS COMMUNICATION OF NEEDS Skin   Independent Verbally Surgical wounds(Closed incision on abdomen)                       Personal Care Assistance Level of Assistance  Bathing, Feeding, Dressing Bathing Assistance: Independent Feeding assistance: Independent Dressing Assistance: Independent     Functional Limitations  Info             SPECIAL CARE FACTORS FREQUENCY                       Contractures      Additional Factors Info  Code Status, Allergies Code Status Info: Full Allergies Info: NKA           Current Medications (10/31/2017):  This is the current hospital active medication list Current Facility-Administered Medications  Medication Dose Route Frequency Provider Last Rate Last Dose  . acetaminophen (TYLENOL) tablet 650 mg  650 mg Oral Q6H PRN Toy Baker, MD       Or  . acetaminophen (TYLENOL) suppository 650 mg  650 mg Rectal Q6H PRN Doutova, Anastassia, MD      . feeding supplement (JEVITY 1.2 CAL) liquid 1,000 mL  1,000 mL Per Tube Q24H Focht, Jessica L, PA 50 mL/hr at 10/31/17 1334 1,000 mL at 10/31/17 1334  . folic acid injection 1 mg  1 mg Intravenous Daily Doutova, Anastassia, MD   1 mg at 10/31/17 1448  . free water 300 mL  300 mL Per Tube QID Debbe Odea, MD      . guaiFENesin (ROBITUSSIN) 100 MG/5ML solution 300 mg  15 mL Oral Q6H Knox Royalty, NP   300 mg at 10/31/17 1350  . [START ON 11/01/2017] levothyroxine (SYNTHROID, LEVOTHROID) tablet 50 mcg  50 mcg Per Tube QAC breakfast Debbe Odea, MD      . MEDLINE mouth rinse  15 mL  Mouth Rinse BID Debbe Odea, MD   15 mL at 10/31/17 0842  . nicotine (NICODERM CQ - dosed in mg/24 hours) patch 21 mg  21 mg Transdermal Daily Bodenheimer, Charles A, NP   21 mg at 10/31/17 0843  . ondansetron (ZOFRAN) tablet 4 mg  4 mg Oral Q6H PRN Doutova, Anastassia, MD       Or  . ondansetron (ZOFRAN) injection 4 mg  4 mg Intravenous Q6H PRN Doutova, Anastassia, MD   4 mg at 10/26/17 1525  . oxyCODONE (Oxy IR/ROXICODONE) immediate release tablet 5-10 mg  5-10 mg Oral Q4H PRN Focht, Jessica L, PA   10 mg at 10/31/17 1326  . thiamine (B-1) injection 100 mg  100 mg Intravenous Daily Toy Baker, MD   100 mg at 10/31/17 4037     Discharge Medications: Please see discharge summary for a list of discharge  medications.  Relevant Imaging Results:  Relevant Lab Results:   Additional Information SSN: [place], [place]

## 2017-10-31 NOTE — Progress Notes (Addendum)
Nutrition Follow-up  DOCUMENTATION CODES:   Severe malnutrition in context of chronic illness  INTERVENTION:  Continue Jevity 1.2 to goal rate of 12ml/hr  At goal provides 1440 calories, 67 grams of protein and 963mL free water  347mL free water 4 times daily per MD, provides total of 2140mL free water  Continue to Monitor Mg, Phos, K+ until tomorrow (3/22), MD replete as necessary. Patient is at high risk for refeeding given PO intake and weight loss -Doing ok thus far, Mg 1.5 but other lytes WNL this AM  NUTRITION DIAGNOSIS:   Severe Malnutrition related to chronic illness as evidenced by energy intake < or equal to 75% for > or equal to 1 month, moderate muscle depletion, moderate fat depletion, percent weight loss. 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   Patient was out of room for a procedure during visit. Appears to be tolerating tubefeeding. Electrolytes being monitored and repleted.  Oncology saw her today. PEG tube should help to maintain weight during treatment if she chooses to undergo this.  Labs reviewed:  Na 132, Mg 1.5 Medications reviewed and include:  Folic Acid, Thiamine  2130QM UOP last 24 hrs, 2.3L fluid positive  Diet Order:  Diet clear liquid Room service appropriate? Yes; Fluid consistency: Thin  EDUCATION NEEDS:   Not appropriate for education at this time  Skin:  Skin Assessment: Reviewed RN Assessment  Last BM:  PTA  Height:   Ht Readings from Last 1 Encounters:  10/26/17 5\' 2"  (1.575 m)    Weight:    Wt Readings from Last 1 Encounters:  10/31/17 99 lb 10.4 oz (45.2 kg)    Ideal Body Weight:  50 kg  BMI:  Body mass index is 18.23 kg/m.  Estimated Nutritional Needs:   Kcal:  1400-1600 calories (35-40 cal/kg)  Protein:  60-67 grams (1.5-1.7g/kg)  Fluid:  >1.5L  Satira Anis. Cynthia Hasz, MS, RD LDN Inpatient Clinical Dietitian Pager [phone]

## 2017-10-31 NOTE — Consult Note (Signed)
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 female with recent presentation with progressive dysphasia including inability to swallow her own saliva for which she was referred to Dr. Constance Holster on 10/22/17.  Evaluation at that time demonstrated residence of an extensive infiltrating mass fuse involvement of the oropharynx and extending into the nasopharyngeal area and downward into the base of the tongue, epiglottis, hypopharynx, supraglottic space and glottis.  Patient had at least 2 emergency room visits for the complaint during February 2019, both were terminated by her leaving AMA.  Finally, after the assistance of Dr. Constance Holster, patient agreed to be admitted to the hospital for further evaluation.  Patient had PEG tube placed to allow for nutrition delivery.  We do not have tissue confirmation so far.  Patient reports extensive history of smoking, alcohol use and crack cocaine use over the past 6 months.  Reports not drinking alcohol anymore as she has been unable to swallow.  According to her, she lives with her 2 ex-boyfriends who assist her with activities of daily living.  1 of them has had head and neck cancer in the past and does not smoke anymore, but at least 1 of them is actively engaged in alcohol abuse.  Patient herself reports no significant headaches, but does have significant discomfort in the neck.  Denies any pain with speech.  Denies active shortness of breath, chest pain, or cough.  No nausea, vomiting, abdominal pain, diarrhea, or constipation.    Past Medical History:  Diagnosis Date  . Alcoholism ([place])   . Bipolar affective disorder, manic ([place]) 1993   Dx'd at [facility]  . Cancer ([place]) 1989   uterine  . Chronic pain   . COPD (chronic obstructive pulmonary disease) ([place])   . Headache(784.0) 09/25/2011  . Hyperthyroidism 1993  . Low back  pain   . Mental disorder   . Personality disorders 1993  :  Past Surgical History:  Procedure Laterality Date  . BACK SURGERY    . BREAST SURGERY  2003   to check for possible cancer  . cauterization of uterine cancer  2008  . GASTROSTOMY N/A 10/29/2017   Procedure: INSERTION OF GASTROSTOMY TUBE;  Surgeon: Georganna Skeans, MD;  Location: [facility];  Service: General;  Laterality: N/A;  . IR FLUORO RM 30-60 MIN  10/28/2017  . tubal ligaation    . TUBAL LIGATION    :   Current Facility-Administered Medications:  .  acetaminophen (TYLENOL) tablet 650 mg, 650 mg, Oral, Q6H PRN **OR** acetaminophen (TYLENOL) suppository 650 mg, 650 mg, Rectal, Q6H PRN, Doutova, Anastassia, MD .  feeding supplement (JEVITY 1.2 CAL) liquid 1,000 mL, 1,000 mL, Per Tube, Q24H, Focht, Jessica L, PA, Last Rate: 50 mL/hr at 10/31/17 1334, 1,000 mL at 10/31/17 1334 .  folic acid injection 1 mg, 1 mg, Intravenous, Daily, Doutova, Anastassia, MD, 1 mg at 10/31/17 4193 .  free water 300 mL, 300 mL, Per Tube, QID, Rizwan, Saima, MD .  guaiFENesin (ROBITUSSIN) 100 MG/5ML solution 300 mg, 15 mL, Oral, Q6H, Knox Royalty, NP, 300 mg at 10/31/17 1350 .  [START ON 11/01/2017] levothyroxine (SYNTHROID, LEVOTHROID) tablet 50 mcg, 50 mcg, Per Tube, QAC breakfast, Rizwan, Saima, MD .  magnesium sulfate IVPB 4 g 100 mL, 4 g, Intravenous, Once, Rizwan, Saima, MD, Last Rate: 50 mL/hr at 10/31/17 1327, 4 g  at 10/31/17 1327 .  MEDLINE mouth rinse, 15 mL, Mouth Rinse, BID, Rizwan, Saima, MD, 15 mL at 10/31/17 0842 .  nicotine (NICODERM CQ - dosed in mg/24 hours) patch 21 mg, 21 mg, Transdermal, Daily, Bodenheimer, Charles A, NP, 21 mg at 10/31/17 0843 .  ondansetron (ZOFRAN) tablet 4 mg, 4 mg, Oral, Q6H PRN **OR** ondansetron (ZOFRAN) injection 4 mg, 4 mg, Intravenous, Q6H PRN, Doutova, Anastassia, MD, 4 mg at 10/26/17 1525 .  oxyCODONE (Oxy IR/ROXICODONE) immediate release tablet 5-10 mg, 5-10 mg, Oral, Q4H PRN, Focht, Jessica L, PA, 10 mg  at 10/31/17 1326 .  thiamine (B-1) injection 100 mg, 100 mg, Intravenous, Daily, Doutova, Anastassia, MD, 100 mg at 10/31/17 0839:  . feeding supplement (JEVITY 1.2 CAL)  1,000 mL Per Tube Q24H  . folic acid  1 mg Intravenous Daily  . free water  300 mL Per Tube QID  . guaiFENesin  15 mL Oral Q6H  . [START ON 11/01/2017] levothyroxine  50 mcg Per Tube QAC breakfast  . mouth rinse  15 mL Mouth Rinse BID  . nicotine  21 mg Transdermal Daily  . thiamine  100 mg Intravenous Daily  :  No Known Allergies:   Family History  Problem Relation Age of Onset  . Cirrhosis Father    Social History   Socioeconomic History  . Marital status: Divorced    Spouse name: Not on file  . Number of children: Not on file  . Years of education: Not on file  . Highest education level: Not on file  Occupational History  . Not on file  Social Needs  . Financial resource strain: Not on file  . Food insecurity:    Worry: Not on file    Inability: Not on file  . Transportation needs:    Medical: Not on file    Non-medical: Not on file  Tobacco Use  . Smoking status: Current Every Day Smoker    Packs/day: 3.00    Years: 28.00    Pack years: 84.00    Types: Cigarettes  . Smokeless tobacco: Never Used  Substance and Sexual Activity  . Alcohol use: Yes    Comment: daily  . Drug use: Yes    Types: Marijuana, Cocaine    Comment: yesterday  . Sexual activity: Never  Lifestyle  . Physical activity:    Days per week: Not on file    Minutes per session: Not on file  . Stress: Not on file  Relationships  . Social connections:    Talks on phone: Not on file    Gets together: Not on file    Attends religious service: Not on file    Active member of club or organization: Not on file    Attends meetings of clubs or organizations: Not on file    Relationship status: Not on file  . Intimate partner violence:    Fear of current or ex partner: Not on file    Emotionally abused: Not on file     Physically abused: Not on file    Forced sexual activity: Not on file  Other Topics Concern  . Not on file  Social History Narrative  . Not on file    Review of Systems: As per HPI.  All other systems are negative.   Physical Exam:  Blood pressure (!) 128/93, pulse 88, temperature 98.4 F (36.9 C), temperature source Oral, resp. rate 18, height 5\' 2"  (1.575 m), weight 99 lb 10.4 oz (45.2 kg),  SpO2 93 %.  Cachectic female who appears significantly older than the stated age.   HEENT: Anicteric sclerae, no facial swelling.  Bulky mass in the neck appreciated as asymmetric call and nodular palpation sensation of the upper airway Lungs: Coarse breath sounds bilaterally, no stridor Cardiac: S1/S2, regular, no murmurs rubs or gallops Abdomen: Soft, minimally tender around the site of the PEG tube placement.  No palpable masses. Lymph nodes: No palpable lymphadenopathy in the supraclavicular, axillary, or inguinal regions Neurologic: No gross focal neurological deficit Skin: No rash, petechiae, or ecchymosis Musculoskeletal: No lower extremity swelling  LABS:  No results for input(s): WBC, HGB, HCT, PLT in the last 72 hours.  Recent Labs    10/30/17 0751 10/31/17 0639  NA 131* 132*  K 4.2 3.5  CL 96* 96*  CO2 25 26  GLUCOSE 130* 103*  BUN <5* <5*  CREATININE 0.60 0.62  CALCIUM 7.9* 8.3*      RADIOLOGY:  Dg Neck Soft Tissue  Result Date: 10/08/2017 CLINICAL DATA:  52 y/o  F; 6 months of choking sensation. EXAM: NECK SOFT TISSUES - 1+ VIEW COMPARISON:  03/05/2016 CT cervical spine FINDINGS: Airway effacement in vallecula and hypopharynx may represent debris, mucosal lesion, or mucosal thickening. Moderate cervical spondylosis with large anterior marginal osteophytes at the C5-6 levels. No radiopaque foreign body identified. IMPRESSION: Airway effacement in vallecula and hypopharynx may represent debris, mucosal lesion, or mucosal thickening. Direct visualization recommended.  Electronically Signed   By: Kristine Garbe M.D.   On: 10/08/2017 23:26   Ct Soft Tissue Neck W Contrast  Result Date: 10/09/2017 CLINICAL DATA:  52 y/o F; 6 months of sore throat. Abnormal radiographs. EXAM: CT NECK WITH CONTRAST TECHNIQUE: Multidetector CT imaging of the neck was performed using the standard protocol following the bolus administration of intravenous contrast. CONTRAST:  8mL ISOVUE-300 IOPAMIDOL (ISOVUE-300) INJECTION 61% COMPARISON:  03/05/2016 CT cervical spine. 10/08/2017 cervical spine radiographs. FINDINGS: Pharynx and larynx: Diffuse irregular an infiltrative masslike enhancement involving nasopharynx, oropharynx, base of tongue, epiglottis, hypopharynx, supraglottic space, and glottis. In the nasopharynx there is mucosal enhancement along the right lateral and posterior walls of the nasopharynx including fossa of Rosenmuller and there is a 10 mm necrotic right node of Rouviere and right retropharyngeal lymph node at C1 level (series 2, image 22, 28). In the oropharynx mucosal enhancement involves the posterior pharyngeal wall crossing the midline, right lateral pharyngeal wall, retromolar trigone connecting right soft palate to base of tongue via the pterygomandibular raphe, bilateral base of tongue with up to 17 mm invasion on the right (series 2, image 42), and to lesser degree the left lateral oropharyngeal wall. Ill-defined enhancement effacing right prevertebral space, parapharyngeal space, carotid space likely representing local soft tissue invasion from approximately C2-C5 levels. In the hypopharynx there is irregular mucosal thickening of the epiglottis, and bulky masslike enhancement of the mucosal surfaces circumferentially with ulcerations resulting in severe airway effacement. Laryngeal involvement involves true and false cords, anterior commissure, and paraglottic fat. No definite thyroid cartilage invasion or extra laryngeal extension. Salivary glands: No definite  invasion. Thyroid: Normal. Lymph nodes: Necrotic 10 mm right level 3 and 14 x 13 mm necrotic right level 2 B lymphadenopathy (series 2, image 41 and 52). Vascular: Negative. Limited intracranial: Negative. Visualized orbits: Negative. Mastoids and visualized paranasal sinuses: Clear. Skeleton: No acute or aggressive process. Upper chest: Several 2-3 mm nodules within the upper lobes bilaterally. Other: None. IMPRESSION: Diffuse masslike irregular mucosal enhancement, likely squamous cell carcinoma, involving  right nasopharynx, right soft palate, near circumferential oropharynx, bilateral base of tongue, circumferential hypopharynx, and larynx. Severe airway effacement at level of hypopharynx. Probable infiltrative invasion of right parapharyngeal, prevertebral, and carotid spaces at approximately C2-C5 levels. Necrotic right retropharyngeal, level 2B, and level 3 metastatic lymphadenopathy. 2-3 mm pulmonary nodules in upper lobes bilaterally. These results were called by telephone at the time of interpretation on 10/09/2017 at 12:37 am to Dr. Nanda Quinton , who verbally acknowledged these results. Electronically Signed   By: Kristine Garbe M.D.   On: 10/09/2017 00:43   Ct Chest W Contrast  Result Date: 10/25/2017 CLINICAL DATA:  Acute onset of generalized chest and epigastric pain. Recent weight loss. Current history of head and neck malignancy. EXAM: CT CHEST, ABDOMEN, AND PELVIS WITH CONTRAST TECHNIQUE: Multidetector CT imaging of the chest, abdomen and pelvis was performed following the standard protocol during bolus administration of intravenous contrast. CONTRAST:  141mL ISOVUE-300 IOPAMIDOL (ISOVUE-300) INJECTION 61% COMPARISON:  Chest radiograph performed 01/23/2017, and CT of the abdomen and pelvis performed 03/05/2016 FINDINGS: CT CHEST FINDINGS Cardiovascular: The heart is normal in size. The thoracic aorta is unremarkable. The great vessels are within normal limits. Mediastinum/Nodes: The  mediastinum is unremarkable in appearance. No mediastinal lymphadenopathy is seen. The thyroid gland is grossly unremarkable in appearance. No axillary lymphadenopathy is seen. Lungs/Pleura: A tiny peripheral opacity at the right middle lobe is thought to reflect atelectasis. A calcified granuloma is noted at the left lung base. No focal consolidation, pleural effusion or pneumothorax is seen. No dominant mass is identified. Musculoskeletal: No acute osseous abnormalities are identified. The visualized musculature is unremarkable in appearance. Enhancement anterior to the trachea at the level of the neck is thought to reflect prior procedure in this region. CT ABDOMEN PELVIS FINDINGS Hepatobiliary: The liver is unremarkable in appearance. The gallbladder is unremarkable in appearance. The common bile duct remains normal in caliber. Pancreas: The pancreas is within normal limits. Spleen: The spleen is unremarkable in appearance. Adrenals/Urinary Tract: The adrenal glands are unremarkable in appearance. The kidneys are within normal limits. There is no evidence of hydronephrosis. No renal or ureteral stones are identified. No perinephric stranding is seen. Stomach/Bowel: The stomach is unremarkable in appearance. The small bowel is within normal limits. The appendix is not visualized; there is no evidence for appendicitis. The colon is unremarkable in appearance. Mild soft tissue inflammation is noted along the left paracolic gutter, of uncertain significance. This could reflect an omental infarct. Vascular/Lymphatic: Scattered calcification is seen along the abdominal aorta and its branches. The abdominal aorta is otherwise grossly unremarkable. The inferior vena cava is grossly unremarkable. No retroperitoneal lymphadenopathy is seen. No pelvic sidewall lymphadenopathy is identified. Reproductive: The bladder is mildly distended and grossly unremarkable. The uterus is grossly unremarkable. No suspicious adnexal  masses are seen. Other: No additional soft tissue abnormalities are seen. Musculoskeletal: No acute osseous abnormalities are identified. The visualized musculature is unremarkable in appearance. IMPRESSION: 1. Mild soft tissue inflammation along the left paracolic gutter, of uncertain significance. This could reflect an omental infarct. 2. No evidence of metastatic disease to the chest, abdomen or pelvis. Aortic Atherosclerosis (ICD10-I70.0). Electronically Signed   By: Garald Balding M.D.   On: 10/25/2017 22:47   Ct Abdomen Pelvis W Contrast  Result Date: 10/25/2017 CLINICAL DATA:  Acute onset of generalized chest and epigastric pain. Recent weight loss. Current history of head and neck malignancy. EXAM: CT CHEST, ABDOMEN, AND PELVIS WITH CONTRAST TECHNIQUE: Multidetector CT imaging of the chest,  abdomen and pelvis was performed following the standard protocol during bolus administration of intravenous contrast. CONTRAST:  155mL ISOVUE-300 IOPAMIDOL (ISOVUE-300) INJECTION 61% COMPARISON:  Chest radiograph performed 01/23/2017, and CT of the abdomen and pelvis performed 03/05/2016 FINDINGS: CT CHEST FINDINGS Cardiovascular: The heart is normal in size. The thoracic aorta is unremarkable. The great vessels are within normal limits. Mediastinum/Nodes: The mediastinum is unremarkable in appearance. No mediastinal lymphadenopathy is seen. The thyroid gland is grossly unremarkable in appearance. No axillary lymphadenopathy is seen. Lungs/Pleura: A tiny peripheral opacity at the right middle lobe is thought to reflect atelectasis. A calcified granuloma is noted at the left lung base. No focal consolidation, pleural effusion or pneumothorax is seen. No dominant mass is identified. Musculoskeletal: No acute osseous abnormalities are identified. The visualized musculature is unremarkable in appearance. Enhancement anterior to the trachea at the level of the neck is thought to reflect prior procedure in this region. CT  ABDOMEN PELVIS FINDINGS Hepatobiliary: The liver is unremarkable in appearance. The gallbladder is unremarkable in appearance. The common bile duct remains normal in caliber. Pancreas: The pancreas is within normal limits. Spleen: The spleen is unremarkable in appearance. Adrenals/Urinary Tract: The adrenal glands are unremarkable in appearance. The kidneys are within normal limits. There is no evidence of hydronephrosis. No renal or ureteral stones are identified. No perinephric stranding is seen. Stomach/Bowel: The stomach is unremarkable in appearance. The small bowel is within normal limits. The appendix is not visualized; there is no evidence for appendicitis. The colon is unremarkable in appearance. Mild soft tissue inflammation is noted along the left paracolic gutter, of uncertain significance. This could reflect an omental infarct. Vascular/Lymphatic: Scattered calcification is seen along the abdominal aorta and its branches. The abdominal aorta is otherwise grossly unremarkable. The inferior vena cava is grossly unremarkable. No retroperitoneal lymphadenopathy is seen. No pelvic sidewall lymphadenopathy is identified. Reproductive: The bladder is mildly distended and grossly unremarkable. The uterus is grossly unremarkable. No suspicious adnexal masses are seen. Other: No additional soft tissue abnormalities are seen. Musculoskeletal: No acute osseous abnormalities are identified. The visualized musculature is unremarkable in appearance. IMPRESSION: 1. Mild soft tissue inflammation along the left paracolic gutter, of uncertain significance. This could reflect an omental infarct. 2. No evidence of metastatic disease to the chest, abdomen or pelvis. Aortic Atherosclerosis (ICD10-I70.0). Electronically Signed   By: Garald Balding M.D.   On: 10/25/2017 22:47   Ir Fluoro Rm 30-60 Min  Result Date: 10/28/2017 CLINICAL DATA:  Oropharyngeal carcinoma. Needs enteral feeding support. EXAM: IR FLOURO RM 0-60 MIN  COMPARISON:  CT 10/25/2017 TECHNIQUE: The procedure, risks (including but not limited to bleeding, infection, organ damage ), Benefits, and alternatives were explained to the patient. Questions regarding the procedure were encouraged and answered. The patient understands and consents to the procedure. Upper abdomen prepped and draped in usual sterile fashion. A 5 French angled angiographic catheter was placed as orogastric tube into the stomach and the stomach was insufflated with gas. Under fluoroscopy, it was evident that the transverse colon overlie the stomach, confirmed on bilateral oblique projections. No safe percutaneous access was available. The procedure was therefore terminated. IMPRESSION: Transverse catheter overlies the stomach, preventing safe percutaneous approach for gastrostomy catheter. This represented a different orientation than was seen on earlier CT. Consider surgical consultation for surgical gastrostomy placement. Electronically Signed   By: Lucrezia Europe M.D.   On: 10/28/2017 11:30    Assessment and Plan:  52 y.o. female with diagnosis of locally advanced malignancy in  the area of the pharynx without available pathological confirmation so far.  Considering extensive smoking history, most likely squamous cell carcinoma.  Due to extent of the tumor, likely to be surgically resectable, and will need therapy.  Due to the advanced nature, I do consider chemotherapy plus radiation as the better option as the patient is at very high risk of distal recurrence of her disease.  Patient is somewhat reticent to proceed with chemotherapy.  The radiation alone is a viable option that can be entertained.  Recommendation: -Discharge from the hospital at discretion of the primary treating service. -I will arrange for patient to return to my clinic next week with lab work to discuss further therapy strategy.  The appointment will be coordinated with Dr. Isidore Moos.    Ardath Sax, MD 10/31/2017,  3:04 PM

## 2017-10-31 NOTE — Progress Notes (Signed)
PROGRESS NOTE    Cynthia Church   EPP:[id number]  DOB: [birth year]  DOA: 10/25/2017 PCP: Patient, No Pcp Per   Brief Narrative:  Cynthia Church  52 y.o. 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 Severe airway effacement at level of hypopharynx." and necrotic lymphadenopathy. She was admitted for work up but left AMA.  Seen by Dr Cynthia Church as outpt around 3/12 who recommended admission for PEG tube placement. She could take sips of liquids but cannot swallow solids. K noted to be 2.3    Subjective:  no complaints.   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 - 3/20: will start feeds today-  Nutrition recommendations: Jevity 1.2 at 38mL/hr, increase by 10 every 24 hours to goal rate of 44mL/hr and 150 cc of free water Q 6 hrs-  - apparently had had significant weight loss due to inability to eat- following for refeeding syndrome- she is a high risk - Dr Cynthia Church following- he has consulted medical and rad oncology for further input - palliative care also consulted   Active Problems:  Severe hypokalemia, hypomagnesemia -   Following and replacing  Hypothyroid - TSH 20.490 on 3/16 and 26.444 today - change IV Synthroid to per  tube  Poly substance abuse- - UDS + for Cocaine (used Crack daily), Opiates, Cannabis  - has h/o Tobacco abuse- tells me she smokes 2ppm - on nicotine patch - h/o alcohol abuse- she states she has not been drinking lately due to inability to swallow- cont Thiamine and Folic acid  UTI?  - UA + for WBC but not much bacteria - U culture shows no growth - Ceftriaxone started on 3/16-  stopped on 3/20    Bipolar disorder? - obtained a psych eval for capacity - per psych she has the capacity to make medical decisions   DVT prophylaxis: ambulating, SCDs Code Status: Full code Family Communication:  Disposition Plan: will need to follow for refeeding syndrome over the next few days- tube feeds are recommended to be advanced only by 10 cc / 24 hrs Consultants:   gen surg  IR Procedures:   PEG Antimicrobials:  Anti-infectives (From admission, onward)   Start     Dose/Rate Route Frequency Ordered Stop   10/28/17 1026  ceFAZolin (ANCEF) 2-4 GM/100ML-% IVPB    Note to Pharmacy:  Cynthia Church   : cabinet override      10/28/17 1026 10/28/17 1029   10/26/17 1045  cefTRIAXone (ROCEPHIN) 1 g in sodium chloride 0.9 % 100 mL IVPB  Status:  Discontinued     1 g 200 mL/hr over 30 Minutes Intravenous Every 24 hours 10/26/17 1036 10/30/17 1211       Objective: Vitals:   10/30/17 1049 10/30/17 2015 10/31/17 0417 10/31/17 0439  BP: 116/72 (!) 142/88 (!) 168/95  Pulse:  99 91   Resp:  18 18   Temp:  98.6 F (37 C) 98.2 F (36.8 C)   TempSrc:  Oral Oral   SpO2:  91% (!) 87%   Weight:    45.2 kg (99 lb 10.4 oz)  Height:        Intake/Output Summary (Last 24 hours) at 10/31/2017 1448 Last data filed at 10/31/2017 1447 Gross per 24 hour  Intake 83.17 ml  Output 1500 ml  Net -1416.83 ml   Filed Weights   10/26/17 1750 10/30/17 0358 10/31/17 0439  Weight: 39.7 kg (87 lb 8 oz) 40.7 kg (89 lb 11.6 oz) 45.2 kg (99 lb 10.4 oz)    Examination: General exam: Appears comfortable ,  cachectic  HEENT: PERRLA, oral mucosa moist, no sclera icterus or thrush Respiratory system: Clear to auscultation. Respiratory effort normal. Cardiovascular system: S1 & S2 heard, RRR.  No murmurs  Gastrointestinal system: Abdomen soft, non-tender, nondistended. Normal bowel sound. No organomegaly- PEG tube noted with dressing in place which was not opened Central nervous system: Alert and oriented. No focal neurological deficits. Extremities: No cyanosis, clubbing or edema Skin: No rashes or ulcers Psychiatry:  Abnormal mood and affect, agitated/ irritable     Data Reviewed: I have personally reviewed following labs and imaging studies  CBC: Recent Labs  Lab 10/25/17 1404 10/26/17 0450  WBC 8.8 6.6  NEUTROABS 6.6  --   HGB 16.7* 13.7  HCT 46.0 40.4  MCV 91.3 92.2  PLT 234 270   Basic Metabolic Panel: Recent Labs  Lab 10/25/17 1750  10/26/17 0450  10/28/17 0009 10/28/17 0817 10/29/17 0359 10/30/17 0751 10/31/17 0639  NA  --    < > 134*   < > 131* 135 133* 131* 132*  K  --    < > 2.8*   < > 4.1 3.9 3.4* 4.2 3.5  CL  --    < > 99*   < > 103 105 98* 96* 96*  CO2  --    < > 23   < > 23 23 25 25 26   GLUCOSE  --    < > 120*   < > 103* 92 83 130* 103*  BUN  --    < > 13   < > <5* <5* <5* <5* <5*  CREATININE  --    < > 0.75   < > 0.52 0.55 0.57 0.60 0.62  CALCIUM  --    < > 8.0*   < > 7.5* 7.7* 7.7* 7.9* 8.3*  MG 1.8  --  2.1  --   --   --  1.2* 1.7 1.5*  PHOS 3.0  --  2.8  --   --   --   --   --  3.2   < > = values in this interval not displayed.   GFR: Estimated Creatinine Clearance: 60 mL/min (by C-G formula based on SCr of 0.62 mg/dL). Liver Function Tests: Recent Labs  Lab 10/25/17 1404 10/26/17 0450 10/31/17 0639  AST 24 18 23   ALT 13* 9* 11*  ALKPHOS 61 46 57  BILITOT 1.3* 0.9 1.0  PROT 7.8 5.8* 6.3*  ALBUMIN 3.6 2.7* 2.7*   No results for input(s): LIPASE, AMYLASE in the last 168 hours. No results for input(s): AMMONIA in the last 168  hours. Coagulation Profile: Recent Labs  Lab 10/26/17 1116  INR 0.98   Cardiac Enzymes: Recent Labs  Lab 10/25/17 2356 10/26/17 0450 10/26/17 1116  TROPONINI <0.03 <0.03 <0.03   BNP (last 3 results) No results for input(s): PROBNP in the last 8760 hours. HbA1C: No results for input(s): HGBA1C in the last 72 hours. CBG: Recent Labs  Lab 10/30/17 1651 10/30/17 2014 10/31/17 0008 10/31/17 0414 10/31/17 0849  GLUCAP 105* 129* 102* 115* 84   Lipid Profile: No results for input(s): CHOL, HDL, LDLCALC, TRIG, CHOLHDL, LDLDIRECT in the last 72 hours. Thyroid Function Tests: Recent Labs    10/31/17 0639  TSH 26.444*   Anemia Panel: No results for input(s): VITAMINB12, FOLATE, FERRITIN, TIBC, IRON, RETICCTPCT in the last 72 hours. Urine analysis:    Component Value Date/Time   COLORURINE YELLOW 10/26/2017 0151   APPEARANCEUR HAZY (A) 10/26/2017 0151   LABSPEC >1.046 (H) 10/26/2017 0151   PHURINE 7.0 10/26/2017 0151   GLUCOSEU NEGATIVE 10/26/2017 0151   HGBUR SMALL (A) 10/26/2017 0151   BILIRUBINUR NEGATIVE 10/26/2017 0151   KETONESUR 5 (A) 10/26/2017 0151   PROTEINUR NEGATIVE 10/26/2017 0151   UROBILINOGEN 0.2 06/01/2015 1329   NITRITE NEGATIVE 10/26/2017 0151   LEUKOCYTESUR MODERATE (A) 10/26/2017 0151   Sepsis Labs: @LABRCNTIP (procalcitonin:4,lacticidven:4) ) Recent Results (from the past 240 hour(s))  Culture, Urine     Status: None   Collection Time: 10/26/17  1:51 AM  Result Value Ref Range Status   Specimen Description URINE, RANDOM  Final   Special Requests NONE  Final   Culture   Final    NO GROWTH Performed at [facility], [place] [address], [place], [place] [address]    Report Status 10/27/2017 FINAL  Final  Surgical PCR screen     Status: None   Collection Time: 10/28/17  9:18 PM  Result Value Ref Range Status   MRSA, PCR NEGATIVE NEGATIVE Final   Staphylococcus aureus NEGATIVE NEGATIVE Final    Comment: (NOTE) The Xpert SA Assay (FDA  approved for NASAL specimens in patients 27 years of age and older), is one component of a comprehensive surveillance program. It is not intended to diagnose infection nor to guide or monitor treatment. Performed at [facility], [place] [address], [place], [place] [address]          Radiology Studies: No results found.    Scheduled Meds: . feeding supplement (JEVITY 1.2 CAL)  1,000 mL Per Tube Q24H  . folic acid  1 mg Intravenous Daily  . free water  200 mL Per Tube QID  . guaiFENesin  15 mL Oral Q6H  . levothyroxine  25 mcg Intravenous Daily  . mouth rinse  15 mL Mouth Rinse BID  . nicotine  21 mg Transdermal Daily  . thiamine  100 mg Intravenous Daily   Continuous Infusions: . magnesium sulfate 1 - 4 g bolus IVPB 4 g (10/31/17 1327)     LOS: 6 days    Time spent in minutes: [place], MD [facility] Pager: [email or web address] Password [facility] 10/31/2017, 2:48 PM

## 2017-10-31 NOTE — Progress Notes (Signed)
SLP Cancellation Note  Patient Details Name: KIMBALL MANSKE MRN: [id number] DOB: Apr 30, 1966   Cancelled treatment:       Reason Eval/Treat Not Completed: Other (comment) Attempted MBS to identify potential compensatory strategies to facilitate comfort feeding of liquids and soft solids. Pt initially agreed, but when she arrived in the radiology suite she demanded to return to her room despite encouragement to participate.    Jadalee Westcott, Katherene Ponto 10/31/2017, 12:26 PM

## 2017-10-31 NOTE — Progress Notes (Signed)
Education provided for tube feed medications, patient administered medications herself with talk though with nurse, more hands on education to follow.

## 2017-10-31 NOTE — Care Management Note (Signed)
Case Management Note  Patient Details  Name: Cynthia Church MRN: [id number] Date of Birth: 11-19-1965  Subjective/Objective:    Admitted for hypokalemia in the setting of malnutrition secondary to inability to eat secondary to head and neck cancer.Hx of alcoholism, bipolar affective disorder, uterine cancer, chronic pain syndrome, COPD, headache, hypothyroidism. Pt states lives with boyfriend Eddie Dibbles whom she says is her HCPOA. PTA independent with ADL's, no DME usage per pt. States without PCP.      3/19  S/P INSERTION OF GASTROSTOMY TUBE    [name] (Other)     [phone]       Action/Plan: Transition to home when medically stable/ready with home health services for enteral feeding.  Expected Discharge Date:                  Expected Discharge Plan:  [place]  In-House Referral:  Clinical Social Work  Discharge planning Services  CM Consult  Post Acute Care Choice:  Durable Medical Equipment, Home Health Choice offered to:  Patient  DME Arranged:   enteral feeding and pump [facility]ME [facility]   [facility] Arranged:  RN [place] Agency:   [facility],pending MD order, NCM have requested order.  Status of Service:   in process  If discussed at Long Length of Stay Meetings, dates discussed:    Additional Comments:  Sharin Mons, RN 10/31/2017, 11:10 AM
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
## 2017-11-01 ENCOUNTER — Other Ambulatory Visit (HOSPITAL_COMMUNITY): Payer: Self-pay | Admitting: Hematology and Oncology

## 2017-11-01 DIAGNOSIS — E876 Hypokalemia: Principal | ICD-10-CM

## 2017-11-01 DIAGNOSIS — R221 Localized swelling, mass and lump, neck: Secondary | ICD-10-CM

## 2017-11-01 DIAGNOSIS — F10220 Alcohol dependence with intoxication, uncomplicated: Secondary | ICD-10-CM

## 2017-11-01 DIAGNOSIS — F609 Personality disorder, unspecified: Secondary | ICD-10-CM

## 2017-11-01 DIAGNOSIS — Z720 Tobacco use: Secondary | ICD-10-CM

## 2017-11-01 DIAGNOSIS — E039 Hypothyroidism, unspecified: Secondary | ICD-10-CM

## 2017-11-01 DIAGNOSIS — E43 Unspecified severe protein-calorie malnutrition: Secondary | ICD-10-CM

## 2017-11-01 DIAGNOSIS — J449 Chronic obstructive pulmonary disease, unspecified: Secondary | ICD-10-CM

## 2017-11-01 LAB — BASIC METABOLIC PANEL
Anion gap: 12 (ref 5–15)
Anion gap: 13 (ref 5–15)
CALCIUM: 8 mg/dL — AB (ref 8.9–10.3)
CHLORIDE: 92 mmol/L — AB (ref 101–111)
CO2: 24 mmol/L (ref 22–32)
CO2: 22 mmol/L (ref 22–32)
Calcium: 8.2 mg/dL — ABNORMAL LOW (ref 8.9–10.3)
Chloride: 90 mmol/L — ABNORMAL LOW (ref 101–111)
Creatinine, Ser: 0.56 mg/dL (ref 0.44–1.00)
Creatinine, Ser: 0.58 mg/dL (ref 0.44–1.00)
GFR calc Af Amer: >60 mL/min (ref >60)
GFR calc Af Amer: >60 mL/min (ref >60)
Glucose, Bld: 103 mg/dL — ABNORMAL HIGH (ref 65–99)
Glucose, Bld: 99 mg/dL (ref 65–99)
Potassium: 4.4 mmol/L (ref 3.5–5.1)
Potassium: 4.3 mmol/L (ref 3.5–5.1)
SODIUM: 126 mmol/L — AB (ref 135–145)
SODIUM: 127 mmol/L — AB (ref 135–145)

## 2017-11-01 LAB — MAGNESIUM: MAGNESIUM: 2 mg/dL (ref 1.7–2.4)

## 2017-11-01 LAB — PHOSPHORUS: PHOSPHORUS: 2.9 mg/dL (ref 2.5–4.6)

## 2017-11-01 MED ORDER — GUAIFENESIN 100 MG/5ML PO SOLN
15.0000 mL | Freq: Four times a day (QID) | ORAL | Status: DC
  Filled 2017-11-01: qty 15

## 2017-11-01 NOTE — Progress Notes (Signed)
Pt stated that she was going to leave AMA because the MD would not let her have a puree diet without SLP evaluating her first. The pt demanded that her IV be removed and her tube feeding disconnected. Pt signed AMA form and was escorted to the exit.
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
## 2017-11-01 NOTE — Plan of Care (Signed)

## 2017-11-02 ENCOUNTER — Telehealth: Payer: Self-pay | Admitting: *Deleted

## 2017-11-02 NOTE — Telephone Encounter (Signed)
Oncology Nurse Navigator Documentation  Rec'd call from pt's HCPOA, [name].  He indicated pt is staying with him s/p her [place] yesterday.  He asked about Monday 1:00 appt at [facility].  I explained referrals have been made to [facility] per her request for tmts closer to her home.  He voiced understanding.  Gayleen Orem, RN, BSN Head & Neck Oncology Nurse [facility] [phone]

## 2017-11-02 NOTE — ED Notes (Signed)
Received a call from pt's boyfriend stating he did not know what to do as pt signed out AMA and wanted him to go buy her some crack to help with the pain.  Advised him to call pt's oncologist to discuss treatment plans.

## 2017-11-03 ENCOUNTER — Encounter (HOSPITAL_COMMUNITY): Payer: Self-pay | Admitting: Emergency Medicine

## 2017-11-03 ENCOUNTER — Emergency Department (HOSPITAL_COMMUNITY)
Admission: EM | Admit: 2017-11-03 | Discharge: 2017-11-03 | Discharge status: Against Medical Advice | Payer: Medicare Other | Attending: Emergency Medicine | Admitting: Emergency Medicine

## 2017-11-03 ENCOUNTER — Emergency Department (HOSPITAL_COMMUNITY): Payer: Medicare Other

## 2017-11-03 DIAGNOSIS — R110 Nausea: Secondary | ICD-10-CM | POA: Diagnosis not present

## 2017-11-03 DIAGNOSIS — R109 Unspecified abdominal pain: Secondary | ICD-10-CM | POA: Insufficient documentation

## 2017-11-03 DIAGNOSIS — Z5329 Procedure and treatment not carried out because of patient's decision for other reasons: Secondary | ICD-10-CM | POA: Insufficient documentation

## 2017-11-03 DIAGNOSIS — Z79899 Other long term (current) drug therapy: Secondary | ICD-10-CM | POA: Insufficient documentation

## 2017-11-03 DIAGNOSIS — Z431 Encounter for attention to gastrostomy: Secondary | ICD-10-CM | POA: Insufficient documentation

## 2017-11-03 DIAGNOSIS — R531 Weakness: Secondary | ICD-10-CM | POA: Insufficient documentation

## 2017-11-03 DIAGNOSIS — E039 Hypothyroidism, unspecified: Secondary | ICD-10-CM | POA: Diagnosis not present

## 2017-11-03 DIAGNOSIS — F17210 Nicotine dependence, cigarettes, uncomplicated: Secondary | ICD-10-CM | POA: Insufficient documentation

## 2017-11-03 DIAGNOSIS — R222 Localized swelling, mass and lump, trunk: Secondary | ICD-10-CM | POA: Insufficient documentation

## 2017-11-03 DIAGNOSIS — R221 Localized swelling, mass and lump, neck: Secondary | ICD-10-CM | POA: Insufficient documentation

## 2017-11-03 DIAGNOSIS — I10 Essential (primary) hypertension: Secondary | ICD-10-CM | POA: Insufficient documentation

## 2017-11-03 DIAGNOSIS — Z8541 Personal history of malignant neoplasm of cervix uteri: Secondary | ICD-10-CM | POA: Diagnosis not present

## 2017-11-03 DIAGNOSIS — J449 Chronic obstructive pulmonary disease, unspecified: Secondary | ICD-10-CM | POA: Insufficient documentation

## 2017-11-03 LAB — CBC WITH DIFFERENTIAL/PLATELET
BASOS ABS: 0 10*3/uL (ref 0.0–0.1)
Basophils Relative: 0 %
Eosinophils Absolute: 0 10*3/uL (ref 0.0–0.7)
Eosinophils Relative: 0 %
HEMATOCRIT: 42 % (ref 36.0–46.0)
Hemoglobin: 13.8 g/dL (ref 12.0–15.0)
LYMPHS ABS: 1.4 10*3/uL (ref 0.7–4.0)
LYMPHS PCT: 18 %
MCH: 31 pg (ref 26.0–34.0)
MCHC: 32.9 g/dL (ref 30.0–36.0)
MCV: 94.4 fL (ref 78.0–100.0)
Monocytes Absolute: 0.4 10*3/uL (ref 0.1–1.0)
Monocytes Relative: 6 %
NEUTROS ABS: 5.9 10*3/uL (ref 1.7–7.7)
Neutrophils Relative %: 76 %
Platelets: 313 10*3/uL (ref 150–400)
RBC: 4.45 MIL/uL (ref 3.87–5.11)
RDW: 14.3 % (ref 11.5–15.5)
WBC: 7.7 10*3/uL (ref 4.0–10.5)

## 2017-11-03 LAB — COMPREHENSIVE METABOLIC PANEL
ALK PHOS: 62 U/L (ref 38–126)
ALT: 13 U/L — AB (ref 14–54)
AST: 21 U/L (ref 15–41)
Albumin: 3 g/dL — ABNORMAL LOW (ref 3.5–5.0)
Anion gap: 12 (ref 5–15)
BILIRUBIN TOTAL: 1 mg/dL (ref 0.3–1.2)
BUN: 8 mg/dL (ref 6–20)
CALCIUM: 8.6 mg/dL — AB (ref 8.9–10.3)
CO2: 27 mmol/L (ref 22–32)
CREATININE: 0.56 mg/dL (ref 0.44–1.00)
Chloride: 95 mmol/L — ABNORMAL LOW (ref 101–111)
GFR calc Af Amer: >60 mL/min (ref >60)
Glucose, Bld: 102 mg/dL — ABNORMAL HIGH (ref 65–99)
Potassium: 3.5 mmol/L (ref 3.5–5.1)
Sodium: 134 mmol/L — ABNORMAL LOW (ref 135–145)
TOTAL PROTEIN: 7.2 g/dL (ref 6.5–8.1)

## 2017-11-03 LAB — MAGNESIUM: MAGNESIUM: 1.9 mg/dL (ref 1.7–2.4)

## 2017-11-03 MED ORDER — HYDROCODONE-ACETAMINOPHEN 5-325 MG PO TABS: 1.0000 | ORAL_TABLET | Freq: Once | ORAL | Status: DC

## 2017-11-03 MED ORDER — FREE WATER: 300.0000 mL | Freq: Once | Status: DC

## 2017-11-03 MED ORDER — ONDANSETRON HCL 4 MG/5ML PO SOLN: 4.0000 mg | Freq: Once | ORAL | Status: DC

## 2017-11-03 MED ORDER — JEVITY 1.2 CAL PO LIQD
1000.0000 mL | ORAL | Status: DC
  Filled 2017-11-03 (×2): qty 1000

## 2017-11-03 NOTE — ED Notes (Signed)
Waiting for abdomen xray results to come back and will then give medications

## 2017-11-03 NOTE — ED Provider Notes (Signed)
[facility] EMERGENCY DEPARTMENT Provider Note   CSN: [id number] Arrival date & time: 11/03/17  4098     History   Chief Complaint Chief Complaint  Patient presents with  . Weakness    HPI Cynthia Church is a 52 y.o. female with a past medical history as outlined below, most significant for recent diagnosis of a pharyngeal mass under the care of Dr. Constance Holster and with recent hospitalization for PEG tube placement and further diagnostic testing presenting with increasing weakness and inability to eat or drink by mouth. She was being supported during her recent hospitalization with water and Jevity through the newly placed PEG tube, unfortunately she left from the hospital AMA 2 days ago and did not give an opportunity to her providers to give outpatient instructions for the PEG tube use.  She has arranged however for her first oncology visit tomorrow here at [facility].  She presents with complaint of increasing weakness and being hungry and is worried about her potassium levels and is in a lot of pain with nausea.  She reports no problems with urination.  She does have soreness around the PEG tube site, there has been no drainage from the site.   The history is provided by the patient and a friend.    Past Medical History:  Diagnosis Date  . Alcoholism ([place])   . Bipolar affective disorder, manic ([place]) 1993   Dx'd at [facility]  . Cancer ([place]) 1989   uterine  . Chronic pain   . COPD (chronic obstructive pulmonary disease) ([place])   . Headache(784.0) 09/25/2011  . Hyperthyroidism 1993  . Low back pain   . Mental disorder   . Personality disorders 1993    Patient Active Problem List   Diagnosis Date Noted  . Malnutrition ([place])   . Increased oropharyngeal secretions   . Palliative care by specialist   . 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 Protein-calorie malnutrition, severe 10/29/2017  . Hypokalemia 10/08/2017  . Polycythemia 10/08/2017  . MDD (major depressive disorder) 07/07/2016   . COPD without exacerbation ([place]) 06/05/2016  . Tobacco abuse 06/05/2016  . Bipolar disorder ([place]) 06/05/2016  . 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 Abdominal aortic atherosclerosis ([place]) 06/05/2016  . Hypothyroidism 06/05/2016  . Alcohol dependence ([place]) 09/29/2011    Past Surgical History:  Procedure Laterality Date  . BACK SURGERY    . BREAST SURGERY  2003   to check for possible cancer  . cauterization of uterine cancer  2008  . GASTROSTOMY N/A 10/29/2017   Procedure: INSERTION OF GASTROSTOMY TUBE;  Surgeon: Georganna Skeans, MD;  Location: [facility];  Service: General;  Laterality: N/A;  . IR FLUORO RM 30-60 MIN  10/28/2017  . tubal ligaation    . TUBAL LIGATION       OB History    Gravida      Para      Term      Preterm      AB      Living  0     SAB      TAB      Ectopic      Multiple      Live Births               Home Medications    Prior to Admission medications   Medication Sig Start Date End Date Taking? Authorizing Provider  metoprolol tartrate (LOPRESSOR) 25 MG tablet Take 1 tablet (25 mg total) by mouth 2 (  two) times daily. 07/10/16  Yes Kerrie Buffalo, NP  traZODone (DESYREL) 50 MG tablet Take 1 tablet (50 mg total) by mouth at bedtime as needed for sleep. 07/10/16  Yes Kerrie Buffalo, NP  diphenhydrAMINE (BENADRYL) 25 MG tablet Take 1 tablet (25 mg total) by mouth every 6 (six) hours. Patient not taking: Reported on 10/25/2017 01/02/17   Jola Schmidt, MD  famotidine (PEPCID) 20 MG tablet Take 1 tablet (20 mg total) by mouth 2 (two) times daily. Patient not taking: Reported on 10/25/2017 01/02/17   Jola Schmidt, MD  ibuprofen (ADVIL,MOTRIN) 800 MG tablet Take 1 tablet (800 mg total) by mouth every 8 (eight) hours as needed for moderate pain. Patient not taking: Reported on 10/25/2017 01/23/17   Milton Ferguson, MD  levofloxacin (LEVAQUIN) 500 MG tablet Take 1 tablet (500 mg total) by mouth daily. Patient not taking: Reported on  10/25/2017 01/02/17   Jola Schmidt, MD  predniSONE (DELTASONE) 10 MG tablet Take 6 tablets (60 mg total) by mouth daily. Patient not taking: Reported on 10/25/2017 01/02/17   Jola Schmidt, MD    Family History Family History  Problem Relation Age of Onset  . Cirrhosis Father     Social History Social History   Tobacco Use  . Smoking status: Current Every Day Smoker    Packs/day: 3.00    Years: 28.00    Pack years: 84.00    Types: Cigarettes  . Smokeless tobacco: Never Used  Substance Use Topics  . Alcohol use: Yes    Comment: daily  . Drug use: Yes    Types: Marijuana, Cocaine    Comment: yesterday     Allergies   Patient has no known allergies.   Review of Systems Review of Systems  Constitutional: Negative for fever.  HENT: Positive for sore throat and trouble swallowing. Negative for congestion.   Eyes: Negative.   Respiratory: Negative for chest tightness, shortness of breath, wheezing and stridor.   Cardiovascular: Negative for chest pain.  Gastrointestinal: Positive for abdominal pain and nausea. Negative for vomiting.  Genitourinary: Negative.   Musculoskeletal: Negative for arthralgias, joint swelling and neck pain.  Skin: Negative.  Negative for rash and wound.  Neurological: Positive for weakness. Negative for dizziness, light-headedness, numbness and headaches.  Psychiatric/Behavioral: Negative.      Physical Exam Updated Vital Signs Ht 5\' 2"  (1.575 m)   Wt 44.9 kg (99 lb)   BMI 18.11 kg/m   Physical Exam  Constitutional:  Cachectic   HENT:  Head: Normocephalic and atraumatic.  Mouth/Throat: Mucous membranes are dry. No oropharyngeal exudate, posterior oropharyngeal erythema or tonsillar abscesses.  Eyes: Conjunctivae are normal.  Neck: Normal range of motion.  Small palpable mass right neck and right upper chest.  Cardiovascular: Normal rate, regular rhythm, normal heart sounds and intact distal pulses.  Pulmonary/Chest: Effort normal and  breath sounds normal. She has no wheezes.  Abdominal: Soft. Bowel sounds are normal. There is no tenderness.  Musculoskeletal: Normal range of motion.  Neurological: She is alert.  Skin: Skin is warm and dry.  Psychiatric: She has a normal mood and affect.  Nursing note and vitals reviewed.    ED Treatments / Results  Labs (all labs ordered are listed, but only abnormal results are displayed) Labs Reviewed  COMPREHENSIVE METABOLIC PANEL - Abnormal; Notable for the following components:      Result Value   Sodium 134 (*)    Chloride 95 (*)    Glucose, Bld 102 (*)    Calcium  8.6 (*)    Albumin 3.0 (*)    ALT 13 (*)    All other components within normal limits  CBC WITH DIFFERENTIAL/PLATELET  MAGNESIUM  URINALYSIS, ROUTINE W REFLEX MICROSCOPIC    EKG None  Radiology Dg Abdomen 1 View  Result Date: 11/03/2017 CLINICAL DATA:  Peg tube placement. EXAM: ABDOMEN - 1 VIEW COMPARISON:  October 28, 2017 FINDINGS: The distal tip of the PEG tube overlies the gastric bubble. A tiny amount of free air under the diaphragm is most consistent with recent gastric tube placement. IMPRESSION: The distal tip of the gastric tube projects over the gastric bubble. There is a small amount of free air which is expected after surgery. Electronically Signed   By: Dorise Bullion III M.D   On: 11/03/2017 11:40    Procedures Procedures (including critical care time)  Medications Ordered in ED Medications  ondansetron (ZOFRAN) 4 MG/5ML solution 4 mg (has no administration in time range)  HYDROcodone-acetaminophen (NORCO/VICODIN) 5-325 MG per tablet 1 tablet (has no administration in time range)  feeding supplement (JEVITY 1.2 CAL) liquid 1,000 mL (has no administration in time range)  free water 300 mL (has no administration in time range)     Initial Impression / Assessment and Plan / ED Course  I have reviewed the triage vital signs and the nursing notes.  Pertinent labs & imaging results that  were available during my care of the patient were reviewed by me and considered in my medical decision making (see chart for details).     Lab tests, pain and nausea medicine ordered per PEG tube, Kub to confirm placement given abdominal soreness.  Also, water and Jevity ordered per PEG.  Pt left ama prior to these tx performed, stating she needed to smoke.  I did not have an opportunity to talk to her prior to her leaving.  She does have f/u tomorrow at 1 pm with the cancer center.   Final Clinical Impressions(s) / ED Diagnoses   Final diagnoses:  Weakness    ED Discharge Orders    None       Landis Martins 11/03/17 1242    Noemi Chapel, MD 11/04/17 2143

## 2017-11-03 NOTE — ED Notes (Signed)
Pt alerted staff that she would like to go outside to smoke, pt then notified by staff that she could not go smoke and per policy that if she left to go smoke then the ER process would start over again. The pt then preceded to walk out to go smoke with family members. Eboni RN aware.

## 2017-11-03 NOTE — ED Triage Notes (Signed)
Pt states she signed out AMA from [facility] on 3/22 and is requesting multiple services today.

## 2017-11-03 NOTE — ED Notes (Signed)
Lab in room getting blood work

## 2017-11-03 NOTE — ED Notes (Signed)
Pt left AMA. Told pt she could not go outside and smoke a cigarette or she would be discharged from the facility. Pt was not cooperative at all and left. Have talked with the family as well.

## 2017-11-03 NOTE — ED Notes (Signed)
Have assessed pt. Have also given blankets and awaiting further instructions.

## 2017-11-04 ENCOUNTER — Other Ambulatory Visit (HOSPITAL_COMMUNITY): Payer: Self-pay

## 2017-11-04 ENCOUNTER — Encounter (HOSPITAL_COMMUNITY): Payer: Self-pay | Admitting: Lab

## 2017-11-04 ENCOUNTER — Encounter (HOSPITAL_COMMUNITY): Payer: Self-pay | Admitting: Hematology

## 2017-11-04 ENCOUNTER — Other Ambulatory Visit: Payer: Self-pay

## 2017-11-04 ENCOUNTER — Inpatient Hospital Stay (HOSPITAL_COMMUNITY): Payer: Medicare Other | Attending: Hematology | Admitting: Hematology

## 2017-11-04 VITALS — BP 137/85 | HR 94 | Temp 97.8°F | Resp 16 | Ht 63.0 in | Wt 80.6 lb

## 2017-11-04 DIAGNOSIS — R590 Localized enlarged lymph nodes: Secondary | ICD-10-CM | POA: Diagnosis not present

## 2017-11-04 DIAGNOSIS — J392 Other diseases of pharynx: Secondary | ICD-10-CM

## 2017-11-04 DIAGNOSIS — E46 Unspecified protein-calorie malnutrition: Secondary | ICD-10-CM | POA: Diagnosis not present

## 2017-11-04 DIAGNOSIS — Z79899 Other long term (current) drug therapy: Secondary | ICD-10-CM | POA: Insufficient documentation

## 2017-11-04 DIAGNOSIS — C760 Malignant neoplasm of head, face and neck: Secondary | ICD-10-CM | POA: Diagnosis present

## 2017-11-04 DIAGNOSIS — H9201 Otalgia, right ear: Secondary | ICD-10-CM | POA: Diagnosis not present

## 2017-11-04 DIAGNOSIS — R1310 Dysphagia, unspecified: Secondary | ICD-10-CM | POA: Diagnosis not present

## 2017-11-04 DIAGNOSIS — F17210 Nicotine dependence, cigarettes, uncomplicated: Secondary | ICD-10-CM | POA: Insufficient documentation

## 2017-11-04 DIAGNOSIS — Z931 Gastrostomy status: Secondary | ICD-10-CM | POA: Diagnosis not present

## 2017-11-04 NOTE — Progress Notes (Unsigned)
Referral sent to [facility]. Records faxed on 3/25.

## 2017-11-04 NOTE — Patient Instructions (Addendum)
[place] at [facility] Discharge Instructions  Today you saw Dr. Raliegh Ip.  Home health will come to see you concerning your feeding tube. You will have referral back to Dr. Constance Holster for biopsy.  PET scan     Thank you for choosing [place] at [facility] to provide your oncology and hematology care.  To afford each patient quality time with our provider, please arrive at least 15 minutes before your scheduled appointment time.   If you have a lab appointment with the [facility] please come in thru the  Main Entrance and check in at the main information desk  You need to re-schedule your appointment should you arrive 10 or more minutes late.  We strive to give you quality time with our providers, and arriving late affects you and other patients whose appointments are after yours.  Also, if you no show three or more times for appointments you may be dismissed from the clinic at the providers discretion.     Again, thank you for choosing [facility].  Our hope is that these requests will decrease the amount of time that you wait before being seen by our physicians.       _____________________________________________________________  Should you have questions after your visit to [facility], please contact our office at [phone] between the hours of 8:30 a.m. and 4:30 p.m.  Voicemails left after 4:30 p.m. will not be returned until the following business day.  For prescription refill requests, have your pharmacy contact our office.       Resources For Cancer Patients and their Caregivers ? American Cancer Society: Can assist with transportation, wigs, general needs, runs Look Good Feel Better.        971-687-5857 ? Cancer Care: Provides financial assistance, online support groups, medication/co-pay assistance.  1-800-813-HOPE 917-558-0399) ? [facility] Assists [place] cancer patients and  their families through emotional , educational and financial support.  9125829144 ? [place] DSS Where to apply for food stamps, Medicaid and utility assistance. (206)093-1647 ? [facility]: Transportation to medical appointments. (864)275-4108 ? Social Security Administration: May apply for disability if have a Stage IV cancer. 289-104-7507 573-325-6172 ? [employer], Disability and Transit Services: Assists with nutrition, care and transit needs. [place] Support Programs:   > Cancer Support Group  2nd Tuesday of the month 1pm-2pm, Journey Room   > Creative Journey  3rd Tuesday of the month 1130am-1pm, Journey Room

## 2017-11-04 NOTE — Progress Notes (Unsigned)
Called Dr Constance Holster office at [phone].  Talked to [facility] and they will contact pt for biopsy.

## 2017-11-04 NOTE — Progress Notes (Signed)
[facility] NOTE  Patient Care Team: Patient, No Pcp Per as PCP - General (General Practice)  CHIEF COMPLAINTS/PURPOSE OF CONSULTATION:  Head and neck cancer.  HISTORY OF PRESENTING ILLNESS:  Cynthia Church 52 y.o. female is seen in consultation today for further workup and management of head and neck cancer.  She has been having sore throat for the past few months.  She also has difficulty swallowing, even had sputum for the past few weeks.  She lost about 36 pounds in the last 4 months.  She was recently admitted to [facility], evaluated by Dr. Lebron Conners and a biopsy was recommended.  A CT scan done on 10/09/2017 showed diffuse masslike enhancement involving the right nasopharynx, right soft palate, near circumferential oropharynx, bilateral base of tongue, hypopharynx and larynx.  She was also evaluated by Dr. Constance Holster who recommended a biopsy.  However patient signed out AMA.  She had a PEG tube placed for nutritional delivery on 10/29/2017.  She is not taking any tube feeds as she does not have any.  She has extensive history of smoking, alcohol use and crack cocaine use in the past.  She reports that she is not drinking alcohol anymore as she is unable to swallow.  She lives with her 2 ex-boyfriend who assist her in ADLs.  She complains of right ear pain for the past few weeks.  She is using heating pad which helps.  MEDICAL HISTORY:  Past Medical History:  Diagnosis Date  . Alcoholism ([place])   . Bipolar affective disorder, manic ([place]) 1993   Dx'd at [facility]  . Cancer ([place]) 1989   uterine  . Chronic pain   . COPD (chronic obstructive pulmonary disease) ([place])   . Headache(784.0) 09/25/2011  . Hyperthyroidism 1993  . Low back pain   . Mental disorder   . Personality disorders 1993    SURGICAL HISTORY: Past Surgical History:  Procedure Laterality Date  . BACK SURGERY    . BREAST SURGERY  2003   to check for possible cancer  . cauterization of  uterine cancer  2008  . GASTROSTOMY N/A 10/29/2017   Procedure: INSERTION OF GASTROSTOMY TUBE;  Surgeon: Georganna Skeans, MD;  Location: [place];  Service: General;  Laterality: N/A;  . IR FLUORO RM 30-60 MIN  10/28/2017  . tubal ligaation    . TUBAL LIGATION      SOCIAL HISTORY: Social History   Socioeconomic History  . Marital status: Divorced    Spouse name: Not on file  . Number of children: Not on file  . Years of education: Not on file  . Highest education level: Not on file  Occupational History  . Not on file  Social Needs  . Financial resource strain: Not on file  . Food insecurity:    Worry: Not on file    Inability: Not on file  . Transportation needs:    Medical: Not on file    Non-medical: Not on file  Tobacco Use  . Smoking status: Current Every Day Smoker    Packs/day: 3.00    Years: 28.00    Pack years: 84.00    Types: Cigarettes  . Smokeless tobacco: Never Used  Substance and Sexual Activity  . Alcohol use: Yes    Comment: daily  . Drug use: Yes    Types: Marijuana, Cocaine    Comment: yesterday  . Sexual activity: Never  Lifestyle  . Physical activity:    Days per week:  Not on file    Minutes per session: Not on file  . Stress: Not on file  Relationships  . Social connections:    Talks on phone: Not on file    Gets together: Not on file    Attends religious service: Not on file    Active member of club or organization: Not on file    Attends meetings of clubs or organizations: Not on file    Relationship status: Not on file  . Intimate partner violence:    Fear of current or ex partner: Not on file    Emotionally abused: Not on file    Physically abused: Not on file    Forced sexual activity: Not on file  Other Topics Concern  . Not on file  Social History Narrative  . Not on file    FAMILY HISTORY: Family History  Problem Relation Age of Onset  . Cirrhosis Father     ALLERGIES:  has No Known Allergies.  MEDICATIONS:  Current  Outpatient Medications  Medication Sig Dispense Refill  . diphenhydrAMINE (BENADRYL) 25 MG tablet Take 1 tablet (25 mg total) by mouth every 6 (six) hours. (Patient not taking: Reported on 10/25/2017) 12 tablet 0  . famotidine (PEPCID) 20 MG tablet Take 1 tablet (20 mg total) by mouth 2 (two) times daily. (Patient not taking: Reported on 10/25/2017) 10 tablet 0  . ibuprofen (ADVIL,MOTRIN) 800 MG tablet Take 1 tablet (800 mg total) by mouth every 8 (eight) hours as needed for moderate pain. (Patient not taking: Reported on 10/25/2017) 20 tablet 0  . levofloxacin (LEVAQUIN) 500 MG tablet Take 1 tablet (500 mg total) by mouth daily. (Patient not taking: Reported on 10/25/2017) 7 tablet 0  . metoprolol tartrate (LOPRESSOR) 25 MG tablet Take 1 tablet (25 mg total) by mouth 2 (two) times daily. (Patient not taking: Reported on 11/04/2017) 60 tablet 0  . predniSONE (DELTASONE) 10 MG tablet Take 6 tablets (60 mg total) by mouth daily. (Patient not taking: Reported on 10/25/2017) 30 tablet 0  . traZODone (DESYREL) 50 MG tablet Take 1 tablet (50 mg total) by mouth at bedtime as needed for sleep. (Patient not taking: Reported on 11/04/2017) 30 tablet 0   No current facility-administered medications for this visit.     REVIEW OF SYSTEMS:   Constitutional: Denies any fevers or night sweats, but has 36 pound weight loss in the last 4 months.   Eyes: Denies blurriness of vision, double vision or watery eyes Ears, nose, mouth, throat, and face: Cannot swallow saliva.  Has right ear pain.  Respiratory: Denies cough, dyspnea or wheezes Cardiovascular: Denies palpitation, chest discomfort or lower extremity swelling Gastrointestinal:  Denies nausea, heartburn or change in bowel habits Skin: Denies abnormal skin rashes Lymphatics: Denies new lymphadenopathy or easy bruising Neurological:Denies numbness, tingling or new weaknesses Behavioral/Psych: Mood is stable, no new changes  All other systems were reviewed with the  patient and are negative.  PHYSICAL EXAMINATION: ECOG PERFORMANCE STATUS: 2 - Symptomatic, <50% confined to bed  Vitals:   11/04/17 1258  BP: 137/85  Pulse: 94  Resp: 16  Temp: 97.8 F (36.6 C)  SpO2: 100%   Filed Weights   11/04/17 1256  Weight: 80 lb 9.6 oz (36.6 kg)    GENERAL:alert, no distress and comfortable SKIN: skin color, texture, turgor are normal, no rashes or significant lesions EYES: normal, conjunctiva are pink and non-injected, sclera clear OROPHARYNX: There is a necrotic tumor in the right oropharyngeal area, involving the  right soft palate.   NECK: Palpable lymph node in the angle of the jaw. LYMPH: No other lymphadenopathy other than right neck.  LUNGS: clear to auscultation and percussion with normal breathing effort HEART: regular rate & rhythm and no murmurs and no lower extremity edema ABDOMEN: PEG tube is well healed. Musculoskeletal:no cyanosis of digits and no clubbing  PSYCH: alert & oriented x 3 with fluent speech NEURO: no focal motor/sensory deficits  LABORATORY DATA:  I have reviewed the data as listed Lab Results  Component Value Date   WBC 7.7 11/03/2017   HGB 13.8 11/03/2017   HCT 42.0 11/03/2017   MCV 94.4 11/03/2017   PLT 313 11/03/2017     Chemistry      Component Value Date/Time   NA 134 (L) 11/03/2017 1101   K 3.5 11/03/2017 1101   CL 95 (L) 11/03/2017 1101   CO2 27 11/03/2017 1101   BUN 8 11/03/2017 1101   CREATININE 0.56 11/03/2017 1101      Component Value Date/Time   CALCIUM 8.6 (L) 11/03/2017 1101   ALKPHOS 62 11/03/2017 1101   AST 21 11/03/2017 1101   ALT 13 (L) 11/03/2017 1101   BILITOT 1.0 11/03/2017 1101       RADIOGRAPHIC STUDIES: I have personally reviewed the radiological images as listed and agreed with the findings in the report. Dg Neck Soft Tissue  Result Date: 10/08/2017 CLINICAL DATA:  52 y/o  F; 6 months of choking sensation. EXAM: NECK SOFT TISSUES - 1+ VIEW COMPARISON:  03/05/2016 CT cervical  spine FINDINGS: Airway effacement in vallecula and hypopharynx may represent debris, mucosal lesion, or mucosal thickening. Moderate cervical spondylosis with large anterior marginal osteophytes at the C5-6 levels. No radiopaque foreign body identified. IMPRESSION: Airway effacement in vallecula and hypopharynx may represent debris, mucosal lesion, or mucosal thickening. Direct visualization recommended. Electronically Signed   By: Kristine Garbe M.D.   On: 10/08/2017 23:26   Dg Abdomen 1 View  Result Date: 11/03/2017 CLINICAL DATA:  Peg tube placement. EXAM: ABDOMEN - 1 VIEW COMPARISON:  October 28, 2017 FINDINGS: The distal tip of the PEG tube overlies the gastric bubble. A tiny amount of free air under the diaphragm is most consistent with recent gastric tube placement. IMPRESSION: The distal tip of the gastric tube projects over the gastric bubble. There is a small amount of free air which is expected after surgery. Electronically Signed   By: Dorise Bullion III M.D   On: 11/03/2017 11:40   Ct Soft Tissue Neck W Contrast  Result Date: 10/09/2017 CLINICAL DATA:  52 y/o F; 6 months of sore throat. Abnormal radiographs. EXAM: CT NECK WITH CONTRAST TECHNIQUE: Multidetector CT imaging of the neck was performed using the standard protocol following the bolus administration of intravenous contrast. CONTRAST:  60mL ISOVUE-300 IOPAMIDOL (ISOVUE-300) INJECTION 61% COMPARISON:  03/05/2016 CT cervical spine. 10/08/2017 cervical spine radiographs. FINDINGS: Pharynx and larynx: Diffuse irregular an infiltrative masslike enhancement involving nasopharynx, oropharynx, base of tongue, epiglottis, hypopharynx, supraglottic space, and glottis. In the nasopharynx there is mucosal enhancement along the right lateral and posterior walls of the nasopharynx including fossa of Rosenmuller and there is a 10 mm necrotic right node of Rouviere and right retropharyngeal lymph node at C1 level (series 2, image 22, 28). In  the oropharynx mucosal enhancement involves the posterior pharyngeal wall crossing the midline, right lateral pharyngeal wall, retromolar trigone connecting right soft palate to base of tongue via the pterygomandibular raphe, bilateral base of tongue with up  to 17 mm invasion on the right (series 2, image 42), and to lesser degree the left lateral oropharyngeal wall. Ill-defined enhancement effacing right prevertebral space, parapharyngeal space, carotid space likely representing local soft tissue invasion from approximately C2-C5 levels. In the hypopharynx there is irregular mucosal thickening of the epiglottis, and bulky masslike enhancement of the mucosal surfaces circumferentially with ulcerations resulting in severe airway effacement. Laryngeal involvement involves true and false cords, anterior commissure, and paraglottic fat. No definite thyroid cartilage invasion or extra laryngeal extension. Salivary glands: No definite invasion. Thyroid: Normal. Lymph nodes: Necrotic 10 mm right level 3 and 14 x 13 mm necrotic right level 2 B lymphadenopathy (series 2, image 41 and 52). Vascular: Negative. Limited intracranial: Negative. Visualized orbits: Negative. Mastoids and visualized paranasal sinuses: Clear. Skeleton: No acute or aggressive process. Upper chest: Several 2-3 mm nodules within the upper lobes bilaterally. Other: None. 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 Severe airway effacement at level of hypopharynx. Probable infiltrative invasion of right parapharyngeal, prevertebral, and carotid spaces at approximately C2-C5 levels. Necrotic right retropharyngeal, level 2B, and level 3 metastatic lymphadenopathy. 2-3 mm pulmonary nodules in upper lobes bilaterally. These results were called by telephone at the  time of interpretation on 10/09/2017 at 12:37 am to Dr. Nanda Quinton , who verbally acknowledged these results. Electronically Signed   By: Kristine Garbe M.D.   On: 10/09/2017 00:43   Ct Chest W Contrast  Result Date: 10/25/2017 CLINICAL DATA:  Acute onset of generalized chest and epigastric pain. Recent weight loss. Current history of head and neck malignancy. EXAM: CT CHEST, ABDOMEN, AND PELVIS WITH CONTRAST TECHNIQUE: Multidetector CT imaging of the chest, abdomen and pelvis was performed following the standard protocol during bolus administration of intravenous contrast. CONTRAST:  180mL ISOVUE-300 IOPAMIDOL (ISOVUE-300) INJECTION 61% COMPARISON:  Chest radiograph performed 01/23/2017, and CT of the abdomen and pelvis performed 03/05/2016 FINDINGS: CT CHEST FINDINGS Cardiovascular: The heart is normal in size. The thoracic aorta is unremarkable. The great vessels are within normal limits. Mediastinum/Nodes: The mediastinum is unremarkable in appearance. No mediastinal lymphadenopathy is seen. The thyroid gland is grossly unremarkable in appearance. No axillary lymphadenopathy is seen. Lungs/Pleura: A tiny peripheral opacity at the right middle lobe is thought to reflect atelectasis. A calcified granuloma is noted at the left lung base. No focal consolidation, pleural effusion or pneumothorax is seen. No dominant mass is identified. Musculoskeletal: No acute osseous abnormalities are identified. The visualized musculature is unremarkable in appearance. Enhancement anterior to the trachea at the level of the neck is thought to reflect prior procedure in this region. CT ABDOMEN PELVIS FINDINGS Hepatobiliary: The liver is unremarkable in appearance. The gallbladder is unremarkable in appearance. The common bile duct remains normal in caliber. Pancreas: The pancreas is within normal limits. Spleen: The spleen is unremarkable in appearance. Adrenals/Urinary Tract: The adrenal glands are unremarkable in  appearance. The kidneys are within normal limits. There is no evidence of hydronephrosis. No renal or ureteral stones are identified. No perinephric stranding is seen. Stomach/Bowel: The stomach is unremarkable in appearance. The small bowel is within normal limits. The appendix is not visualized; there is no evidence for appendicitis. The colon is unremarkable in appearance. Mild soft tissue inflammation is noted along the left paracolic gutter, of uncertain significance. This could reflect an omental infarct. Vascular/Lymphatic: Scattered calcification is seen along the abdominal aorta and its  branches. The abdominal aorta is otherwise grossly unremarkable. The inferior vena cava is grossly unremarkable. No retroperitoneal lymphadenopathy is seen. No pelvic sidewall lymphadenopathy is identified. Reproductive: The bladder is mildly distended and grossly unremarkable. The uterus is grossly unremarkable. No suspicious adnexal masses are seen. Other: No additional soft tissue abnormalities are seen. Musculoskeletal: No acute osseous abnormalities are identified. The visualized musculature is unremarkable in appearance. IMPRESSION: 1. Mild soft tissue inflammation along the left paracolic gutter, of uncertain significance. This could reflect an omental infarct. 2. No evidence of metastatic disease to the chest, abdomen or pelvis. Aortic Atherosclerosis (ICD10-I70.0). Electronically Signed   By: Garald Balding M.D.   On: 10/25/2017 22:47   Ct Abdomen Pelvis W Contrast  Result Date: 10/25/2017 CLINICAL DATA:  Acute onset of generalized chest and epigastric pain. Recent weight loss. Current history of head and neck malignancy. EXAM: CT CHEST, ABDOMEN, AND PELVIS WITH CONTRAST TECHNIQUE: Multidetector CT imaging of the chest, abdomen and pelvis was performed following the standard protocol during bolus administration of intravenous contrast. CONTRAST:  123mL ISOVUE-300 IOPAMIDOL (ISOVUE-300) INJECTION 61% COMPARISON:   Chest radiograph performed 01/23/2017, and CT of the abdomen and pelvis performed 03/05/2016 FINDINGS: CT CHEST FINDINGS Cardiovascular: The heart is normal in size. The thoracic aorta is unremarkable. The great vessels are within normal limits. Mediastinum/Nodes: The mediastinum is unremarkable in appearance. No mediastinal lymphadenopathy is seen. The thyroid gland is grossly unremarkable in appearance. No axillary lymphadenopathy is seen. Lungs/Pleura: A tiny peripheral opacity at the right middle lobe is thought to reflect atelectasis. A calcified granuloma is noted at the left lung base. No focal consolidation, pleural effusion or pneumothorax is seen. No dominant mass is identified. Musculoskeletal: No acute osseous abnormalities are identified. The visualized musculature is unremarkable in appearance. Enhancement anterior to the trachea at the level of the neck is thought to reflect prior procedure in this region. CT ABDOMEN PELVIS FINDINGS Hepatobiliary: The liver is unremarkable in appearance. The gallbladder is unremarkable in appearance. The common bile duct remains normal in caliber. Pancreas: The pancreas is within normal limits. Spleen: The spleen is unremarkable in appearance. Adrenals/Urinary Tract: The adrenal glands are unremarkable in appearance. The kidneys are within normal limits. There is no evidence of hydronephrosis. No renal or ureteral stones are identified. No perinephric stranding is seen. Stomach/Bowel: The stomach is unremarkable in appearance. The small bowel is within normal limits. The appendix is not visualized; there is no evidence for appendicitis. The colon is unremarkable in appearance. Mild soft tissue inflammation is noted along the left paracolic gutter, of uncertain significance. This could reflect an omental infarct. Vascular/Lymphatic: Scattered calcification is seen along the abdominal aorta and its branches. The abdominal aorta is otherwise grossly unremarkable. The  inferior vena cava is grossly unremarkable. No retroperitoneal lymphadenopathy is seen. No pelvic sidewall lymphadenopathy is identified. Reproductive: The bladder is mildly distended and grossly unremarkable. The uterus is grossly unremarkable. No suspicious adnexal masses are seen. Other: No additional soft tissue abnormalities are seen. Musculoskeletal: No acute osseous abnormalities are identified. The visualized musculature is unremarkable in appearance. IMPRESSION: 1. Mild soft tissue inflammation along the left paracolic gutter, of uncertain significance. This could reflect an omental infarct. 2. No evidence of metastatic disease to the chest, abdomen or pelvis. Aortic Atherosclerosis (ICD10-I70.0). Electronically Signed   By: Garald Balding M.D.   On: 10/25/2017 22:47   Ir Fluoro Rm 30-60 Min  Result Date: 10/28/2017 CLINICAL DATA:  Oropharyngeal carcinoma. Needs enteral feeding support. EXAM: IR FLOURO RM  0-60 MIN COMPARISON:  CT 10/25/2017 TECHNIQUE: The procedure, risks (including but not limited to bleeding, infection, organ damage ), Benefits, and alternatives were explained to the patient. Questions regarding the procedure were encouraged and answered. The patient understands and consents to the procedure. Upper abdomen prepped and draped in usual sterile fashion. A 5 French angled angiographic catheter was placed as orogastric tube into the stomach and the stomach was insufflated with gas. Under fluoroscopy, it was evident that the transverse colon overlie the stomach, confirmed on bilateral oblique projections. No safe percutaneous access was available. The procedure was therefore terminated. IMPRESSION: Transverse catheter overlies the stomach, preventing safe percutaneous approach for gastrostomy catheter. This represented a different orientation than was seen on earlier CT. Consider surgical consultation for surgical gastrostomy placement. Electronically Signed   By: Lucrezia Europe M.D.   On:  10/28/2017 11:30    ASSESSMENT & PLAN:  Pharyngeal mass 1.  Head and neck cancer: She has an extensive infiltrating mass involving oropharynx, nasopharynx, right-sided soft palate, hypopharynx, base of tongue, and the larynx.  CT scan also revealed right neck adenopathy.  She did not have a biopsy done yet.  I will refer her back to Dr. Constance Holster for biopsy.  I will also set up a PET/CT scan.  We will make a referral to Dr. Quitman Livings for radiation.  We will see her back in 1-2 weeks after the scan and biopsy.  2.  Malnutrition: She has lost about 36 pounds in the last 4 months.  She cannot have anything by mouth as she is unable to swallow.  She has a PEG tube placed but does not have any tube feeds.  We will place a nutrition consult.  We will give her Ensure plus, to start with 1 can 3 times a day and titrated up to 5-6 cans/day.  3.  Right ear pain: This is a referred pain from her tumor infiltration.  This is relieved by heating pad at this time.  I am reluctant to give her narcotics at this time given her crack cocaine and alcohol abuse. We will also make referral for home health to help with tube feeds. Orders Placed This Encounter  Procedures  . Ambulatory referral to Social Work    Referral Priority:   Routine    Referral Type:   Consultation    Referral Reason:   Specialty Services Required    Number of Visits Requested:   1      Derek Jack, MD 11/04/2017 1:49 PM

## 2017-11-04 NOTE — Assessment & Plan Note (Signed)
1.  Head and neck cancer: She has an extensive infiltrating mass involving oropharynx, nasopharynx, right-sided soft palate, hypopharynx, base of tongue, and the larynx.  CT scan also revealed right neck adenopathy.  She did not have a biopsy done yet.  I will refer her back to Dr. Constance Holster for biopsy.  I will also set up a PET/CT scan.  We will make a referral to Dr. Quitman Livings for radiation.  We will see her back in 1-2 weeks after the scan and biopsy.  2.  Malnutrition: She has lost about 36 pounds in the last 4 months.  She cannot have anything by mouth as she is unable to swallow.  She has a PEG tube placed but does not have any tube feeds.  We will place a nutrition consult.  We will give her Ensure plus, to start with 1 can 3 times a day and titrated up to 5-6 cans/day.  3.  Right ear pain: This is a referred pain from her tumor infiltration.  This is relieved by heating pad at this time.  I am reluctant to give her narcotics at this time given her crack cocaine and alcohol abuse.

## 2017-11-04 NOTE — Progress Notes (Signed)
Patient and family were taught how to do feedings via PEG tube. They were given supplies (4x4's, 60 cc syringe, and several cans of Ensure plus). Family taught back how to do feedings. [facility].

## 2017-11-05 ENCOUNTER — Encounter: Payer: Self-pay | Admitting: General Practice

## 2017-11-05 ENCOUNTER — Encounter (HOSPITAL_COMMUNITY): Payer: Self-pay | Admitting: *Deleted

## 2017-11-05 ENCOUNTER — Other Ambulatory Visit: Payer: Self-pay

## 2017-11-05 ENCOUNTER — Emergency Department (HOSPITAL_COMMUNITY)
Admission: EM | Admit: 2017-11-05 | Discharge: 2017-11-11 | Disposition: A | Discharge status: Home / Self Care | Payer: Medicare Other | Attending: Emergency Medicine | Admitting: Emergency Medicine

## 2017-11-05 ENCOUNTER — Encounter (HOSPITAL_COMMUNITY): Payer: Self-pay

## 2017-11-05 DIAGNOSIS — E876 Hypokalemia: Secondary | ICD-10-CM | POA: Diagnosis not present

## 2017-11-05 DIAGNOSIS — F32A Depression, unspecified: Secondary | ICD-10-CM

## 2017-11-05 DIAGNOSIS — E46 Unspecified protein-calorie malnutrition: Secondary | ICD-10-CM | POA: Diagnosis not present

## 2017-11-05 DIAGNOSIS — J449 Chronic obstructive pulmonary disease, unspecified: Secondary | ICD-10-CM | POA: Diagnosis not present

## 2017-11-05 DIAGNOSIS — Z8542 Personal history of malignant neoplasm of other parts of uterus: Secondary | ICD-10-CM | POA: Diagnosis not present

## 2017-11-05 DIAGNOSIS — F17210 Nicotine dependence, cigarettes, uncomplicated: Secondary | ICD-10-CM | POA: Insufficient documentation

## 2017-11-05 DIAGNOSIS — E039 Hypothyroidism, unspecified: Secondary | ICD-10-CM | POA: Diagnosis not present

## 2017-11-05 DIAGNOSIS — F329 Major depressive disorder, single episode, unspecified: Secondary | ICD-10-CM | POA: Diagnosis present

## 2017-11-05 NOTE — Addendum Note (Signed)
Addended by: Derek Jack on: 11/05/2017 05:32 PM   Modules accepted: Orders
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
## 2017-11-05 NOTE — Progress Notes (Signed)
[facility] CC CSW Progress Notes  Adult Protective Services report made to [employer] DSS/APS [employer]) based on indications of unsafe conditions in current living situation for homeowner Cynthia Church) and unmet needs of patient.  CSW also spoke w [name], [place] at [facility] [name] [phone] cell). Per [name], [facility] has not received orders for this patient as patient left hospital AMA.  CSW advise treatment team and ask for RN, social work and nutritional support.  CSW will await decision of APS, per APS if report is not accepted at [place], they will refer to sheriff for additional support.  Edwyna Shell, LCSW Clinical Social Worker Phone:  [phone]
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
## 2017-11-06 LAB — COMPREHENSIVE METABOLIC PANEL
ALBUMIN: 2.8 g/dL — AB (ref 3.5–5.0)
ALK PHOS: 65 U/L (ref 38–126)
ALT: 12 U/L — AB (ref 14–54)
AST: 18 U/L (ref 15–41)
Anion gap: 10 (ref 5–15)
BUN: 12 mg/dL (ref 6–20)
CALCIUM: 8.4 mg/dL — AB (ref 8.9–10.3)
CO2: 26 mmol/L (ref 22–32)
Chloride: 96 mmol/L — ABNORMAL LOW (ref 101–111)
Creatinine, Ser: 0.57 mg/dL (ref 0.44–1.00)
GFR calc Af Amer: >60 mL/min (ref >60)
GFR calc non Af Amer: >60 mL/min (ref >60)
GLUCOSE: 92 mg/dL (ref 65–99)
Potassium: 3 mmol/L — ABNORMAL LOW (ref 3.5–5.1)
Sodium: 132 mmol/L — ABNORMAL LOW (ref 135–145)
Total Bilirubin: 0.8 mg/dL (ref 0.3–1.2)
Total Protein: 6.5 g/dL (ref 6.5–8.1)

## 2017-11-06 LAB — CBC WITH DIFFERENTIAL/PLATELET
BASOS PCT: 0 %
Basophils Absolute: 0 10*3/uL (ref 0.0–0.1)
Eosinophils Absolute: 0 10*3/uL (ref 0.0–0.7)
Eosinophils Relative: 0 %
HEMATOCRIT: 49.4 % — AB (ref 36.0–46.0)
HEMOGLOBIN: 16.5 g/dL — AB (ref 12.0–15.0)
LYMPHS ABS: 1.2 10*3/uL (ref 0.7–4.0)
Lymphocytes Relative: 19 %
MCH: 31 pg (ref 26.0–34.0)
MCHC: 33.4 g/dL (ref 30.0–36.0)
MCV: 92.9 fL (ref 78.0–100.0)
Monocytes Absolute: 0.4 10*3/uL (ref 0.1–1.0)
Monocytes Relative: 7 %
NEUTROS ABS: 4.5 10*3/uL (ref 1.7–7.7)
NEUTROS PCT: 74 %
Platelets: 220 10*3/uL (ref 150–400)
RBC: 5.32 MIL/uL — AB (ref 3.87–5.11)
RDW: 14.4 % (ref 11.5–15.5)
WBC: 6.1 10*3/uL (ref 4.0–10.5)

## 2017-11-06 LAB — RAPID URINE DRUG SCREEN, HOSP PERFORMED
Amphetamines: NOT DETECTED
BARBITURATES: NOT DETECTED
Benzodiazepines: NOT DETECTED
Cocaine: POSITIVE — AB
Opiates: NOT DETECTED
Tetrahydrocannabinol: POSITIVE — AB

## 2017-11-06 LAB — ETHANOL

## 2017-11-06 MED ORDER — NICOTINE 21 MG/24HR TD PT24
21.0000 mg | MEDICATED_PATCH | Freq: Every day | TRANSDERMAL | Status: DC
  Administered 2017-11-06 – 2017-11-11 (×5): 21 mg via TRANSDERMAL
  Filled 2017-11-06 (×7): qty 1

## 2017-11-06 MED ORDER — ENSURE ENLIVE PO LIQD
237.0000 mL | Freq: Three times a day (TID) | ORAL | Status: DC
  Administered 2017-11-06 (×3): 237 mL
  Filled 2017-11-06 (×7): qty 237

## 2017-11-06 MED ORDER — POTASSIUM CHLORIDE 20 MEQ PO PACK
40.0000 meq | PACK | Freq: Once | ORAL | Status: AC
  Administered 2017-11-06: 40 meq via ORAL
  Filled 2017-11-06: qty 2

## 2017-11-06 MED ORDER — HYDROCODONE-ACETAMINOPHEN 5-325 MG PO TABS
1.0000 | ORAL_TABLET | Freq: Once | ORAL | Status: AC
  Administered 2017-11-06: 1 via ORAL
  Filled 2017-11-06: qty 1

## 2017-11-06 MED ORDER — POTASSIUM CHLORIDE 20 MEQ PO PACK
40.0000 meq | PACK | Freq: Every day | ORAL | Status: DC
  Administered 2017-11-06 – 2017-11-11 (×7): 40 meq via ORAL
  Filled 2017-11-06 (×7): qty 2

## 2017-11-06 MED ORDER — ACETAMINOPHEN 325 MG PO TABS: 650.0000 mg | ORAL_TABLET | Freq: Once | ORAL | Status: DC

## 2017-11-06 MED ORDER — ACETAMINOPHEN 325 MG PO TABS
650.0000 mg | ORAL_TABLET | Freq: Four times a day (QID) | ORAL | Status: DC | PRN
  Administered 2017-11-06 – 2017-11-08 (×8): 650 mg
  Filled 2017-11-06 (×8): qty 2

## 2017-11-06 NOTE — ED Notes (Signed)
EDP in room  

## 2017-11-06 NOTE — ED Notes (Signed)
Pt has socks in locker room
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
## 2017-11-06 NOTE — Progress Notes (Signed)
Pt. meets criteria for inpatient treatment per [name], PA.  Referred out to the following hospitals:  [facility]  [facility]  [facility]  [facility]  [facility]  [facility]        Disposition CSW will continue to follow for placement.  Areatha Keas. Judi Cong, MSW, [place] Disposition Clinical Social Work [phone] (cell) [phone] (office)

## 2017-11-06 NOTE — ED Notes (Signed)
Dr Dina Rich approved ice chips for pt
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
## 2017-11-06 NOTE — ED Notes (Signed)
RN and NT to room to collect EKG and give medications.

## 2017-11-06 NOTE — ED Notes (Signed)
Placed stopcock on end of PEG tube

## 2017-11-07 ENCOUNTER — Other Ambulatory Visit: Payer: Self-pay

## 2017-11-07 LAB — BASIC METABOLIC PANEL
Anion gap: 8 (ref 5–15)
BUN: 13 mg/dL (ref 6–20)
CHLORIDE: 97 mmol/L — AB (ref 101–111)
CO2: 27 mmol/L (ref 22–32)
CREATININE: 0.63 mg/dL (ref 0.44–1.00)
Calcium: 8.4 mg/dL — ABNORMAL LOW (ref 8.9–10.3)
GFR calc Af Amer: >60 mL/min (ref >60)
GFR calc non Af Amer: >60 mL/min (ref >60)
GLUCOSE: 126 mg/dL — AB (ref 65–99)
POTASSIUM: 3.8 mmol/L (ref 3.5–5.1)
Sodium: 132 mmol/L — ABNORMAL LOW (ref 135–145)

## 2017-11-07 LAB — CBC
HEMATOCRIT: 40.9 % (ref 36.0–46.0)
HEMOGLOBIN: 12.9 g/dL (ref 12.0–15.0)
MCH: 30.4 pg (ref 26.0–34.0)
MCHC: 31.5 g/dL (ref 30.0–36.0)
MCV: 96.2 fL (ref 78.0–100.0)
Platelets: 360 10*3/uL (ref 150–400)
RBC: 4.25 MIL/uL (ref 3.87–5.11)
RDW: 14.8 % (ref 11.5–15.5)
WBC: 6 10*3/uL (ref 4.0–10.5)

## 2017-11-07 MED ORDER — OSMOLITE 1.5 CAL PO LIQD
474.0000 mL | Freq: Two times a day (BID) | ORAL | Status: DC
  Filled 2017-11-07 (×2): qty 474

## 2017-11-07 MED ORDER — OSMOLITE 1.5 CAL PO LIQD
237.0000 mL | Freq: Every day | ORAL | Status: DC
  Administered 2017-11-07: 237 mL
  Filled 2017-11-07 (×2): qty 237

## 2017-11-07 MED ORDER — JEVITY 1.2 CAL PO LIQD
1000.0000 mL | ORAL | Status: DC
  Administered 2017-11-07: 1000 mL
  Filled 2017-11-07 (×2): qty 1000

## 2017-11-07 MED ORDER — OSMOLITE 1.5 CAL PO LIQD
300.0000 mL | Freq: Four times a day (QID) | ORAL | Status: DC
  Administered 2017-11-07 – 2017-11-11 (×15): 300 mL
  Filled 2017-11-07 (×21): qty 1000

## 2017-11-07 MED ORDER — TRAZODONE HCL 50 MG PO TABS
50.0000 mg | ORAL_TABLET | Freq: Every evening | ORAL | Status: DC | PRN
  Administered 2017-11-07 – 2017-11-10 (×4): 50 mg
  Filled 2017-11-07 (×4): qty 1

## 2017-11-07 NOTE — ED Notes (Signed)
Patient asking for Nurse, informed patient that she is in another room and will let her know when she comes out.

## 2017-11-07 NOTE — ED Notes (Signed)
Patient requested crayons and blank paper gave them to her

## 2017-11-07 NOTE — ED Notes (Signed)
Nurse and Tech in room feeding patient and changing bed and bathing patient.

## 2017-11-07 NOTE — ED Notes (Signed)
Patient ambulated to the bathroom she also requested a cup of ice, I asked the nurse she said it was ok.

## 2017-11-07 NOTE — ED Notes (Signed)
Pt states she is not a danger to herself or others and wants to get out of here so she can go to her oncology appt.

## 2017-11-07 NOTE — ED Notes (Signed)
I spoke with the patient's POA and got him to call [facility] but he could not get in touch with who he needed to speak with. I called [facility] and got to speak with her case worker and gave her the POA's phone number to call him.

## 2017-11-07 NOTE — ED Notes (Signed)
Patient ambulated to the bathroom and back

## 2017-11-07 NOTE — ED Notes (Signed)
Patient is resting comfortably. 

## 2017-11-07 NOTE — Progress Notes (Signed)
CSW contacted [facility] APS [place]) and made a report due to concerns regarding self-neglect (Not eating enough food to the point of malnourishment, living in hazardous conditions, worsening of MH symptoms) and pt's possible need for guardianship. APS worker was also informed that pt has a POA and that placement at a SNF is being explored as pt's roommates feel that her needs can no longer be adequately met where she was living previously.   Audree Camel, LCSW, [place] Disposition [facility] BHH/TTS [phone]

## 2017-11-07 NOTE — ED Notes (Signed)
Nurse Marita [place]) in room giving meds.

## 2017-11-07 NOTE — ED Provider Notes (Signed)
Patient's notes reviewed.  Behavioral Health will reassess this afternoon.  But clinically and due to her medical problems of the cancer tube feedings and all patients really not a good candidate to go to behavioral health some of her needs would be too complicated for them.  It appears that the family that she wants to return to is not interested in having her come back.  Social worker seems to be working on nursing home places but makes more appropriate sense for this patient.  Discussed with her nurse for this evening and will be passed on to the evening doctor.   Fredia Sorrow, MD 11/07/17 1524
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
## 2017-11-07 NOTE — ED Notes (Signed)
Pt came to nurses station agitated and yelling at staff stating she ws not given her Ensure. RN explained that she was given her ensure at 2140. Pt stated "oh well, I guess its the pain medication I'm needing then." Pt went back to room and Tyl given.

## 2017-11-07 NOTE — ED Notes (Signed)
Patient requesting nurse made nurse aware and she is in patients room.

## 2017-11-07 NOTE — ED Provider Notes (Signed)
Patient requesting something for sleep.  When I review her prior admission she has been on trazodone as needed sleep 50 mg at night.   Rolland Porter, MD 11/07/17 [phone]

## 2017-11-07 NOTE — ED Notes (Signed)
Pt states that she needs to be fed the doctor has ordered her tube feeding it will be started when pharmacy brings it up

## 2017-11-07 NOTE — Progress Notes (Addendum)
Initial Nutrition Assessment  DOCUMENTATION CODES:   Underweight, Severe malnutrition in context of chronic illness  INTERVENTION:   Initiate bolus tube feedings: - 300 mL Osmolite 1.5 via G-tube 4 times daily at 0800, 1200, 1600, and 2000.  - Recommend 100 mL free water flushes before and after each bolus.  Total volume of Osmolite 1.5 daily: 1200 ml/day  TF regimen and recommended free water flushes provide 1800 kcal (105% estimated kcal needs), 75 grams protein (100% estimated protein needs), and 1712 ml free water.  Monitor magnesium, potassium, and phosphorus daily for at least 3 days, MD to replete as needed, as pt is at risk for refeeding syndrome given severe protein-calorie malnutrition, underweight BMI, hypokalemia, and unknown compliance with tube feedings after leaving A[facility] from [facility].  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  GOAL:   Patient will meet greater than or equal to 90% of their needs  MONITOR:   Weight trends, TF tolerance, Labs  REASON FOR ASSESSMENT:   Consult Assessment of nutrition requirement/status  ASSESSMENT:   52 year old female who presented to the ED under IVC due to concerns for her welfare from her roommates. 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 Pt had G-tube placed on 10/29/17 at [facility]. Pt signed out from [facility] AMA during hospitalization last week. Per [facility] assessment, inpatient treatment is recommended and currently seeking outside placement.  10/29/17 - G-tube placed 11/06/17 - Ensure Enlive TID per tube ordered 11/07/17 - Jevity 1.2 @ 50 ml/hr ordered and started  Pt does not have a  clamp on her G-tube and needs one prior to discharge.  Spoke with pt at bedside. Jevity 1.2 infusing @ 50 ml/hr via G-tube. Pt states she is still very hungry and wants more of the tube feeding. Pt states she cannot swallow anything but water and sometimes puts food in her mouth then spits it out.  MD provided verbal approval for RD to change TF formula and rate.  Pt states she has lost over 30 lbs in the past 3 months. Per weight history in chart, pt has experienced a 19% weight loss over the past week which is significant for timeframe.  Discussed bolus tube feedings with pt. Pt states that after leaving [facility] AMA and prior to presentation to ED, pt did bolus feedings of 1 bottle of Ensure BID (provides 700 kcal and 40 grams of protein). This does not meet pt's estimated energy and protein needs.  Pt had appointment scheduled with Helena RD tomorrow (11/08/17). Spoke with this RD who states pt will need to transition to bolus tube feedings prior to discharge. RD to change orders from continuous to bolus. Addendum: RD discussed change from continue to bolus TF with ED RN and with RD who will be following pt.  Addendum: If pt is able to tolerate above TF regimen, recommend end goal of 2 cans Osmolite 1.5 at 0800 and 1200 and 1 can Osmolite 1.5 at 1800.  Medications reviewed and include: 40 mEq potassium chloride daily  Labs reviewed: sodium 132 (L), potassium 3.0 (L)  NUTRITION - FOCUSED PHYSICAL EXAM:    Most Recent Value  Orbital Region  Moderate depletion  Upper Arm Region  Severe depletion  Thoracic and Lumbar Region  Severe depletion  Buccal Region  Moderate depletion  Temple Region  Moderate depletion  Clavicle Bone Region  Moderate depletion  Clavicle and Acromion Bone Region  Severe depletion  Scapular Bone Region  Unable to assess  Dorsal Hand  Mild depletion  Patellar Region  Severe depletion  Anterior Thigh Region  Severe depletion  Posterior Calf Region   Moderate depletion  Edema (RD Assessment)  None  Hair  Reviewed  Eyes  Reviewed  Mouth  Reviewed  Skin  Reviewed  Nails  Reviewed       Diet Order:  No diet orders on file  EDUCATION NEEDS:   Education needs have been addressed  Skin:  Skin Assessment: Skin Integrity Issues: Skin Integrity Issues:: Incisions Incisions: incision to abdomen  Last BM:  unknown  Height:   Ht Readings from Last 1 Encounters:  11/05/17 5\' 3"  (1.6 m)    Weight:   Wt Readings from Last 1 Encounters:  11/05/17 80 lb (36.3 kg)    Ideal Body Weight:  52.3 kg  BMI:  Body mass index is 14.17 kg/m.  Estimated Nutritional Needs:   Kcal:  1500-1700 kcal/day  Protein:  65-75 grams/day  Fluid:  > 1.5 L/day    Gaynell Face, MS, RD, LDN Pager: [phone] Weekend/After Hours: [phone]

## 2017-11-07 NOTE — ED Notes (Signed)
Pt c/o abd pain, nausea and diarrhea since starting new tube feedings.

## 2017-11-08 ENCOUNTER — Encounter (HOSPITAL_COMMUNITY): Payer: Self-pay

## 2017-11-08 ENCOUNTER — Encounter: Payer: Self-pay | Admitting: General Practice

## 2017-11-08 MED ORDER — ACETAMINOPHEN 160 MG/5ML PO SOLN
ORAL | Status: AC
  Administered 2017-11-08: 13:00:00
  Filled 2017-11-08: qty 20.3

## 2017-11-08 MED ORDER — FLUCONAZOLE 100 MG PO TABS
200.0000 mg | ORAL_TABLET | Freq: Once | ORAL | Status: AC
  Administered 2017-11-08: 200 mg via ORAL
  Filled 2017-11-08: qty 2

## 2017-11-08 MED ORDER — ACETAMINOPHEN 160 MG/5ML PO SOLN
650.0000 mg | Freq: Four times a day (QID) | ORAL | Status: DC | PRN
  Administered 2017-11-08 – 2017-11-10 (×4): 650 mg
  Filled 2017-11-08 (×4): qty 20.3

## 2017-11-08 MED ORDER — HYDROMORPHONE HCL 2 MG PO TABS
1.0000 mg | ORAL_TABLET | ORAL | Status: DC | PRN
  Administered 2017-11-08 – 2017-11-10 (×9): 1 mg
  Filled 2017-11-08 (×9): qty 1

## 2017-11-08 NOTE — Progress Notes (Signed)
Nutrition  Patient scheduled for nutrition appointment at [facility] today.  Chart reviewed and noted patient was brought to [facility] ED on 3/26 with IVC paperwork.  Patient is currently in [facility] ED awaiting placement.  Inpatient RD has been consulted for tube feeding and monitoring patient tolerance.    Betsabe Iglesia B. Zenia Resides, [place], [place] Registered Dietitian [phone] (pager)

## 2017-11-08 NOTE — Progress Notes (Signed)
CSW spoke with Cynthia Church case worker, Cynthia Church, who stated that Cynthia case had been reviewed and accepted by APS. Ms. Cynthia Church will be Cynthia Church. She stated that she will contact disposition CSW on Monday, 11/11/17 to collaborate on Cynthia concerns/needs further.   Cynthia Camel, LCSW, [place] Disposition [facility] BHH/TTS [phone]
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
## 2017-11-08 NOTE — BH Assessment (Signed)
[place] Assessment Progress Note    TTS Reassessment:  Patient states that she is in the hospital under false pretenses and that every minute that she remains in treatment that her cancer is spreading.  Patient states: "I love myself" and she is disputing everything that her roommates claimed to be happening on the IVC Paperwork.  However, patient has multiple health issues, she has a history of bipolar disorder and has been abusing cocaine.  Patient presented as being very agitated/irriated because she is having to stay in the ED. She denies any thoughts of hurting herself or others.  It is reported that patient has made threats to pull her feeding tube out and she appears to be malnourished. Her ability to care for herself is questionable and she has been told that she cannot return back to where she was living.  She states that: "I could stay with friends, I get my check next week," but she did not identify anyone specific that she could stay with.  Patient continue to need inpatient placement.
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
## 2017-11-08 NOTE — Progress Notes (Signed)
[facility] CSW Progress Note  CSW reviewed CSW [place]'s note - clarification on patient's appointments are as follows.  Pt had appointment today w nutritionist, subsequent appointments are listed for PET Scan on 4/5 and oncologist on 4/10.  Edwyna Shell, LCSW Clinical Social Worker Phone:  [phone]

## 2017-11-08 NOTE — ED Notes (Signed)
Dr Thurnell Garbe spoke to palliative nurse

## 2017-11-08 NOTE — ED Notes (Signed)
Entered note on wrong pt

## 2017-11-08 NOTE — ED Notes (Signed)
Pt reports pain in right ear and right side of face. Requesting pain medication. Notified EDP

## 2017-11-09 MED ORDER — ONDANSETRON 4 MG PO TBDP
4.0000 mg | ORAL_TABLET | Freq: Once | ORAL | Status: AC
  Administered 2017-11-09: 4 mg via ORAL
  Filled 2017-11-09: qty 1

## 2017-11-09 NOTE — Progress Notes (Signed)
-  Patient requested pain medication . Notified RN.
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
## 2017-11-09 NOTE — ED Notes (Signed)
TTS in progress 

## 2017-11-09 NOTE — Progress Notes (Signed)
Patient meets criteria for inpatient treatment. There are currently no appropriate beds available at [facility] per [facility]. CSW faxed referrals to the following facilities for review.  [place], [place], [place], [place], [place], [place], [place], [place], [place], [place], [place], [place], [place], [place], [place], [place], and [place].   Patient has been declined from [place] and [place].   TTS will continue to seek bed placement.     Herbert Moors, MSW, LCSW-A, LCAS-A 11/09/2017 10:21 AM

## 2017-11-09 NOTE — Progress Notes (Signed)
-  Patient woke out of sleep, and  stated " I will raise hell if I don't get my medication." Notified RN.

## 2017-11-10 MED ORDER — ACETAMINOPHEN 160 MG/5ML PO SOLN
650.0000 mg | Freq: Four times a day (QID) | ORAL | Status: DC | PRN
  Administered 2017-11-10 – 2017-11-11 (×4): 650 mg
  Filled 2017-11-10 (×5): qty 20.3

## 2017-11-10 MED ORDER — HYDROMORPHONE HCL 2 MG PO TABS
2.0000 mg | ORAL_TABLET | ORAL | Status: DC | PRN
  Administered 2017-11-10 – 2017-11-11 (×6): 2 mg
  Filled 2017-11-10 (×7): qty 1

## 2017-11-10 MED ORDER — CLINDAMYCIN PHOSPHATE 600 MG/50ML IV SOLN: 600.0000 mg | Freq: Once | INTRAVENOUS | Status: DC

## 2017-11-10 MED ORDER — HYDROXYZINE HCL 25 MG PO TABS
25.0000 mg | ORAL_TABLET | Freq: Three times a day (TID) | ORAL | Status: DC | PRN
Start: 2017-11-10 — End: 2017-11-11
  Administered 2017-11-10 – 2017-11-11 (×4): 25 mg via ORAL
  Filled 2017-11-10 (×4): qty 1

## 2017-11-10 NOTE — ED Notes (Signed)
Pt had another TTS done today.  Pt upset while talking to [facility].  Requested something " for her nerves"

## 2017-11-10 NOTE — ED Notes (Signed)
Pt woke up from her sleep requesting pain meds. Pt groggy and her BP was 92/57. Pt informed that this RN could not give her pain meds at this time due to her BP and was so groggy.

## 2017-11-10 NOTE — ED Notes (Signed)
Patient left numbers [address]" [phone], "[name]" [phone]

## 2017-11-10 NOTE — ED Notes (Signed)
Pt did own tube feeding  with nurse at bedside helping pt.     Pt concerned about being here in the ER without treatment for her cancer.  States she wants another TTS and to be discharged. Pt denies any SI or HI.
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
## 2017-11-11 ENCOUNTER — Ambulatory Visit (HOSPITAL_COMMUNITY): Payer: Self-pay | Admitting: Hematology

## 2017-11-11 DIAGNOSIS — F1290 Cannabis use, unspecified, uncomplicated: Secondary | ICD-10-CM

## 2017-11-11 DIAGNOSIS — E43 Unspecified severe protein-calorie malnutrition: Secondary | ICD-10-CM

## 2017-11-11 DIAGNOSIS — F332 Major depressive disorder, recurrent severe without psychotic features: Secondary | ICD-10-CM

## 2017-11-11 DIAGNOSIS — C760 Malignant neoplasm of head, face and neck: Secondary | ICD-10-CM

## 2017-11-11 DIAGNOSIS — R450 Nervousness: Secondary | ICD-10-CM

## 2017-11-11 DIAGNOSIS — F419 Anxiety disorder, unspecified: Secondary | ICD-10-CM

## 2017-11-11 DIAGNOSIS — F1490 Cocaine use, unspecified, uncomplicated: Secondary | ICD-10-CM

## 2017-11-11 MED ORDER — OXYCODONE-ACETAMINOPHEN 5-325 MG PO TABS: 1.0000 | ORAL_TABLET | ORAL | 0 refills | Status: DC | PRN

## 2017-11-11 NOTE — ED Notes (Signed)
Per Gene at [facility], cannot get in contact with pt's friends whom she may be able to reside with. Confirmed with this RN to rescind IVC paperwork and d/c paperwork. Examination and recommendation to rescind patient given to Dr. Wilson Singer.

## 2017-11-11 NOTE — ED Notes (Signed)
Given telephone

## 2017-11-11 NOTE — ED Notes (Signed)
Pt has been very cold and currently restless, waiting to leave.

## 2017-11-11 NOTE — ED Notes (Signed)
Pt requesting nicotine patch.

## 2017-11-11 NOTE — ED Notes (Signed)
Called pharmacy for tube feeding. Out in pt's med drawer in pyxis.

## 2017-11-11 NOTE — ED Notes (Signed)
Pt consult to tts

## 2017-11-11 NOTE — ED Notes (Signed)
Pt on 2nd telephone call.

## 2017-11-11 NOTE — ED Notes (Signed)
Pt becoming agitated, wanting to leave.

## 2017-11-11 NOTE — Progress Notes (Signed)
Patient cleared psychiatrically per Elmarie Shiley, [place].  Disposition CSW called and left HIPAA compliant message for two friends, with whom she may be able to stay, that pt gave verbal consent to contact.  CSW did hear back from one of the friends, Stanton Kidney [phone]) who indicated that she was currently staying with her own brother, so could not accommodate patient "but would if I had my own place."  CSW left three messages for a friend of the patient's named [name] [phone]) but received no return call.    CSW contacted pt's nurse @ [facility] ED and recommended that pt's IVC be rescinded and that pt be discharged if the EDP agrees.  Areatha Keas. Judi Cong, MSW, [place] Disposition Clinical Social Work [phone] (cell) [phone] (office)

## 2017-11-11 NOTE — ED Notes (Signed)
1tsp of residual.

## 2017-11-11 NOTE — ED Notes (Signed)
Patient states she had $5 here. Security contacted, they do not have anything on hold. Patient informed. Patient's follow-up appointments discussed, patient verbalized understanding.

## 2017-11-11 NOTE — Plan of Care (Signed)
Cynthia Church is standing quietly outside her room.  She greets me making and keeping eye contact.  She appears frail, chronically ill, and much older than stated age.  There is no psychosocial/family support at bedside.   We talked about what is next.  We discussed her follow-up appointments for PET scan and oncology visit.  We also talked about pain management.  [name] states that she continues to have pain in her right ear, and she expects this to worsen.  I share that, unfortunately, I also expect her pain to worsen.  We talked about primary care physician for management of pain medication.  I share that nursing staff will try to get a list of providers for her.  We also talked about follow-up with oncology for pain management.  Conference with ED provider and nursing staff related to plan of care and disposition.   20 minutes, no charge

## 2017-11-11 NOTE — ED Provider Notes (Signed)
Pt seen for psychiatric reasons in the ED. Extensive head/neck CA. Difficult situation. Will prescribe her some pain medication until she can get further oncology care.   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 Severe airway effacement at level of hypopharynx. Probable infiltrative invasion of right parapharyngeal, prevertebral, and carotid spaces at approximately C2-C5 levels.   Virgel Manifold, MD 11/11/17 [phone]

## 2017-11-11 NOTE — ED Notes (Signed)
Pt out to the nurses station advising this RN that she is in a lot of pain in her right ear and neck. Pt requesting pain and anxiety medication. This RN advised pt that this RN would bring meds to her room shortly.

## 2017-11-11 NOTE — ED Notes (Signed)
Pt given hospital bed for comfort provided by [facility].

## 2017-11-11 NOTE — ED Notes (Signed)
Cynthia Church [facility] is going to get a hospital bed for this pt. Pt also placed in new wine scrubs due to her g-tube leaking.

## 2017-11-11 NOTE — ED Notes (Signed)
TTS consult in process. 

## 2017-11-11 NOTE — ED Notes (Signed)
Oncology NP at bedside speaking with patient.
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
## 2017-11-11 NOTE — ED Notes (Addendum)
Pt provided with a hospital due to length of stay and comfort at this time.

## 2017-11-15 ENCOUNTER — Ambulatory Visit (HOSPITAL_COMMUNITY)
Admission: RE | Admit: 2017-11-15 | Discharge: 2017-11-15 | Disposition: A | Discharge status: Home / Self Care | Payer: Medicare Other | Source: Ambulatory Visit | Attending: Hematology | Admitting: Hematology

## 2017-11-15 DIAGNOSIS — C760 Malignant neoplasm of head, face and neck: Secondary | ICD-10-CM | POA: Insufficient documentation

## 2017-11-15 DIAGNOSIS — R590 Localized enlarged lymph nodes: Secondary | ICD-10-CM | POA: Insufficient documentation

## 2017-11-15 LAB — GLUCOSE, CAPILLARY: Glucose-Capillary: 94 mg/dL (ref 65–99)

## 2017-11-15 MED ORDER — FLUDEOXYGLUCOSE F - 18 (FDG) INJECTION
5.0000 | Freq: Once | INTRAVENOUS | Status: AC | PRN
  Administered 2017-11-15: 5 via INTRAVENOUS

## 2017-11-19 ENCOUNTER — Encounter: Payer: Self-pay | Admitting: General Practice

## 2017-11-19 ENCOUNTER — Encounter (HOSPITAL_COMMUNITY): Payer: Self-pay | Admitting: Dietician

## 2017-11-19 NOTE — Progress Notes (Signed)
Patient left message on RD phone 4/8 asking for RD to call her back ASAP. RD does not work Mondays. Returned call this morning. She says it was "because I needed food". She says she got it all straightened out now and she has access to tube feeding.   During conversation, She did state some of the food "was hard to drink". RD clarified she is to put the tube feeding through her tube. She notes understanding.   She says she got "clammered" up, with one feeding. She thinks it was "too hot". Reviewed tube feeding should be administered at room temp.   She says her pain is severe and she needs something stronger. RD asked her to bring this up with MD tomorrow at her appointment. She did note know her appt was tomorrow. She knew her appt was the 10th, but was unaware that today was the 9th. RD clarified appointment date and time.   Burtis Junes RD, LDN, CNSC Clinical Nutrition Available Tues-Sat via Pager: [phone] 11/19/2017 10:47 AM

## 2017-11-19 NOTE — Progress Notes (Signed)
[facility] CC CSW Progress Notes  CSW received staff message from Lupita Raider RN stating that patient's friend, Lauris Chroman, inquired "what the status of someone getting guardianship" on patient.  CSW has no information on guardianship in this patient's case.  Called [place] DSS, spoke w Loletha Grayer, Adult [employer] (APS) supervisor.  Initial report to APS due to concerns for patient's friend, Lauris Chroman, was not screened in.  DSS did sent sheriff to home and per ED records this resulted in patient's being sent IVC to [facility] ED for assessment.  Patient was released on 4/1 and reportedly returned to Mr [name] home.  Per Ms Bobette Mo, DSS is not pursuing guardianship of this patient nor is APS involved in any aspect of this case at this time.  If Mr [name] wishes to pursue guardianship, he would need to contact local authorities to discuss the process.  Ms Bobette Mo is checking on whether patient can be served by [employer] which is a Lawyer the Health Department and [place] to assist w care for medically complex individuals in the community.  It is not certain patient meets criteria for this program.  Edwyna Shell, LCSW Clinical Social Worker Phone:  [phone]

## 2017-11-20 ENCOUNTER — Encounter (HOSPITAL_COMMUNITY): Payer: Self-pay | Admitting: Hematology

## 2017-11-20 ENCOUNTER — Inpatient Hospital Stay (HOSPITAL_COMMUNITY): Payer: Medicare Other | Attending: Hematology | Admitting: Hematology

## 2017-11-20 VITALS — BP 128/55 | HR 102 | Temp 98.6°F | Resp 18 | Wt 79.2 lb

## 2017-11-20 DIAGNOSIS — H9201 Otalgia, right ear: Secondary | ICD-10-CM | POA: Diagnosis not present

## 2017-11-20 DIAGNOSIS — R42 Dizziness and giddiness: Secondary | ICD-10-CM | POA: Diagnosis not present

## 2017-11-20 DIAGNOSIS — R0602 Shortness of breath: Secondary | ICD-10-CM | POA: Insufficient documentation

## 2017-11-20 DIAGNOSIS — F17210 Nicotine dependence, cigarettes, uncomplicated: Secondary | ICD-10-CM | POA: Insufficient documentation

## 2017-11-20 DIAGNOSIS — R634 Abnormal weight loss: Secondary | ICD-10-CM

## 2017-11-20 DIAGNOSIS — R530 Neoplastic (malignant) related fatigue: Secondary | ICD-10-CM

## 2017-11-20 DIAGNOSIS — C760 Malignant neoplasm of head, face and neck: Secondary | ICD-10-CM | POA: Diagnosis not present

## 2017-11-20 DIAGNOSIS — J392 Other diseases of pharynx: Secondary | ICD-10-CM

## 2017-11-20 MED ORDER — NICOTINE 21 MG/24HR TD PT24: 21.0000 mg | MEDICATED_PATCH | Freq: Every day | TRANSDERMAL | 0 refills | Status: AC

## 2017-11-20 MED ORDER — OXYCODONE-ACETAMINOPHEN 5-325 MG PO TABS: 1.0000 | ORAL_TABLET | Freq: Four times a day (QID) | ORAL | 0 refills | Status: AC | PRN

## 2017-11-20 NOTE — Assessment & Plan Note (Signed)
1.  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 -She tells me that she sees Dr. Quitman Livings next week.  I have talked to her about weekly cisplatin as radiosensitizer.  We talked about the side effects including but not limited to ototoxicity, nephrotoxicity, peripheral neuropathy, cytopenias, life-threatening infections, electrolyte imbalances among others.  She will require a port for chemotherapy administration.  We will make a referral for it.  2.  Weight loss: She is currently taking in 4-5 cans of Ensure Plus daily.  I have told her to increase it to 5-6 cans/day.  3.  Right ear pain: She complains of continuous right ear pain.  I have renewed her Percocet prescription 5 mg to be taken every 6 hours as needed.  Once she starts chemoradiation therapy the pain will likely improve.  4.  I have also given her nicotine 21 mg patch to facilitate her to quit smoking.

## 2017-11-20 NOTE — Progress Notes (Signed)
START ON PATHWAY REGIMEN - Head and Neck     Administer weekly:     Cisplatin   **Always confirm dose/schedule in your pharmacy ordering system**    Patient Characteristics: Oropharynx, HPV Negative/Unknown, Clinically Staged, Stage IV Disease Classification: Oropharynx HPV Status: Awaiting Test Results Current Disease Status: No Distant Metastases and No Recurrent Disease AJCC T Category: T4b AJCC 8 Stage Grouping: IVB AJCC N Category: cN2b AJCC M Category: M0 Intent of Therapy: Non-Curative / Palliative Intent, Discussed with Patient

## 2017-11-20 NOTE — Progress Notes (Signed)
Cynthia Church, [place] [id number]   CLINIC:  Medical Oncology/Hematology  PCP:  Patient, No Pcp Per No address on file None   REASON FOR VISIT:  Follow-up for head and neck squamous cell carcinoma.  CURRENT THERAPY: Weekly cisplatin to be initiated.   INTERVAL HISTORY:  Cynthia Church 52 y.o. female returns for routine follow-up after PET/CT scan and biopsy of the right neck lymph node.  She had FNA of the right neck lymph node done on the same day by Dr. Constance Holster.  She complains of pain in her right ear.  She is taking Percocet given by the ER doctor.  She ran out of the pills.  She is requesting refill.  She does not have a primary care doctor.  She is taking in via PEG tube about 4-5 cans of Ensure Plus.  She denies any recent hospitalizations.   REVIEW OF SYSTEMS:  Review of Systems  Constitutional: Positive for fatigue.  HENT:   Positive for trouble swallowing.   Respiratory: Positive for shortness of breath.   Neurological: Positive for dizziness.  All other systems reviewed and are negative.    PAST MEDICAL/SURGICAL HISTORY:  Past Medical History:  Diagnosis Date  . Alcoholism ([place])   . Bipolar affective disorder, manic ([place]) 1993   [facility]  . Cancer ([place]) 1989   uterine  . Chronic pain   . COPD (chronic obstructive pulmonary disease) ([place])   . Headache(784.0) 09/25/2011  . Hyperthyroidism 1993  . Low back pain   . Mental disorder   . Personality disorders 1993   Past Surgical History:  Procedure Laterality Date  . BACK SURGERY    . BREAST SURGERY  2003   to check for possible cancer  . cauterization of uterine cancer  2008  . GASTROSTOMY N/A 10/29/2017   Procedure: INSERTION OF GASTROSTOMY TUBE;  Surgeon: Georganna Skeans, MD;  Location: [facility];  Service: General;  Laterality: N/A;  . IR FLUORO RM 30-60 MIN  10/28/2017  . tubal ligaation    . TUBAL LIGATION       SOCIAL HISTORY:  Social History   Socioeconomic  History  . Marital status: Divorced    Spouse name: Not on file  . Number of children: Not on file  . Years of education: Not on file  . Highest education level: Not on file  Occupational History  . Not on file  Social Needs  . Financial resource strain: Not on file  . Food insecurity:    Worry: Not on file    Inability: Not on file  . Transportation needs:    Medical: Not on file    Non-medical: Not on file  Tobacco Use  . Smoking status: Current Every Day Smoker    Packs/day: 3.00    Years: 28.00    Pack years: 84.00    Types: Cigarettes  . Smokeless tobacco: Never Used  Substance and Sexual Activity  . Alcohol use: Yes    Comment: daily  . Drug use: Yes    Types: Marijuana, Cocaine    Comment: yesterday  . Sexual activity: Never  Lifestyle  . Physical activity:    Days per week: Not on file    Minutes per session: Not on file  . Stress: Not on file  Relationships  . Social connections:    Talks on phone: Not on file    Gets together: Not on file    Attends religious service:  Not on file    Active member of club or organization: Not on file    Attends meetings of clubs or organizations: Not on file    Relationship status: Not on file  . Intimate partner violence:    Fear of current or ex partner: Not on file    Emotionally abused: Not on file    Physically abused: Not on file    Forced sexual activity: Not on file  Other Topics Concern  . Not on file  Social History Narrative  . Not on file    FAMILY HISTORY:  Family History  Problem Relation Age of Onset  . Cirrhosis Father     CURRENT MEDICATIONS:  Outpatient Encounter Medications as of 11/20/2017  Medication Sig  . nicotine (NICODERM CQ - DOSED IN MG/24 HOURS) 21 mg/24hr patch Place 1 patch (21 mg total) onto the skin daily.  Marland Kitchen oxyCODONE-acetaminophen (PERCOCET/ROXICET) 5-325 MG tablet Place 1 tablet into feeding tube every 6 (six) hours as needed.  . [DISCONTINUED] oxyCODONE-acetaminophen  (PERCOCET/ROXICET) 5-325 MG tablet Place 1-2 tablets into feeding tube every 4 (four) hours as needed.   No facility-administered encounter medications on file as of 11/20/2017.     ALLERGIES:  No Known Allergies   PHYSICAL EXAM:  ECOG Performance status: 2  Vitals:   11/20/17 1405  BP: (!) 128/55  Pulse: (!) 102  Resp: 18  Temp: 98.6 F (37 C)  SpO2: 100%   Filed Weights   11/20/17 1405  Weight: 79 lb 3.2 oz (35.9 kg)      LABORATORY DATA:  I have reviewed the labs as listed.  CBC    Component Value Date/Time   WBC 6.0 11/07/2017 1556   RBC 4.25 11/07/2017 1556   HGB 12.9 11/07/2017 1556   HCT 40.9 11/07/2017 1556   PLT 360 11/07/2017 1556   MCV 96.2 11/07/2017 1556   MCH 30.4 11/07/2017 1556   MCHC 31.5 11/07/2017 1556   RDW 14.8 11/07/2017 1556   LYMPHSABS 1.2 11/06/2017 0059   MONOABS 0.4 11/06/2017 0059   EOSABS 0.0 11/06/2017 0059   BASOSABS 0.0 11/06/2017 0059   CMP Latest Ref Rng & Units 11/07/2017 11/06/2017 11/03/2017  Glucose 65 - 99 mg/dL 126(H) 92 102(H)  BUN 6 - 20 mg/dL 13 12 8   Creatinine 0.44 - 1.00 mg/dL 0.63 0.57 0.56  Sodium 135 - 145 mmol/L 132(L) 132(L) 134(L)  Potassium 3.5 - 5.1 mmol/L 3.8 3.0(L) 3.5  Chloride 101 - 111 mmol/L 97(L) 96(L) 95(L)  CO2 22 - 32 mmol/L 27 26 27   Calcium 8.9 - 10.3 mg/dL 8.4(L) 8.4(L) 8.6(L)  Total Protein 6.5 - 8.1 g/dL - 6.5 7.2  Total Bilirubin 0.3 - 1.2 mg/dL - 0.8 1.0  Alkaline Phos 38 - 126 U/L - 65 62  AST 15 - 41 U/L - 18 21  ALT 14 - 54 U/L - 12(L) 13(L)       DIAGNOSTIC IMAGING:  I have discussed the results of the PET/CT scan dated 11/15/2017 which shows extensive hypermetabolic infiltrating tumor involving right nasopharynx, right soft palate, oropharynx, tongue base, hypopharynx and larynx.  There is hypermetabolic right neck adenopathy.  No other evidence of metastasis.    ASSESSMENT & PLAN:   Head and neck cancer ([place]) 1.  Clinical stage IVb head and neck squamous cell  carcinoma: -She finally had FNA of the right neck level 2 lymph node which was consistent with squamous cell carcinoma, moderately differentiated. -She tells me that she sees Dr. Quitman Livings  next week.  I have talked to her about weekly cisplatin as radiosensitizer.  We talked about the side effects including but not limited to ototoxicity, nephrotoxicity, peripheral neuropathy, cytopenias, life-threatening infections, electrolyte imbalances among others.  She will require a port for chemotherapy administration.  We will make a referral for it.  2.  Weight loss: She is currently taking in 4-5 cans of Ensure Plus daily.  I have told her to increase it to 5-6 cans/day.  3.  Right ear pain: She complains of continuous right ear pain.  I have renewed her Percocet prescription 5 mg to be taken every 6 hours as needed.  Once she starts chemoradiation therapy the pain will likely improve.  4.  I have also given her nicotine 21 mg patch to facilitate her to quit smoking.      Orders placed this encounter:  Orders Placed This Encounter  Procedures  . CBC with Differential  . Comprehensive metabolic panel  . Magnesium      Derek Jack, MD [place] [phone]

## 2017-11-22 ENCOUNTER — Inpatient Hospital Stay (HOSPITAL_COMMUNITY): Payer: Medicare Other

## 2017-11-22 ENCOUNTER — Other Ambulatory Visit (HOSPITAL_COMMUNITY): Payer: Self-pay

## 2017-11-22 MED ORDER — OSMOLITE 1.5 CAL PO LIQD: ORAL | 0 refills | Status: AC

## 2017-11-22 NOTE — Progress Notes (Addendum)
Nutrition Assessment:  Patient with squamous cell carcinoma involving right nasopharynx, right soft palate, orropharynx, bilateral base of tongue, hypopharynx and larynx.  Noted planning chemotherapy and radiation therapy.   Past medical history of etoh abuse, bipolar, cocaine use, COPD, hyperthyroidism.  Chart reviewed and noted hospital admission with G-tube placement on 10/28/2017. G-tube placed due to patient being unable to tolerate oral intake for the past 3 months and weight loss. Inpatient RD note reviewed.  Noted patient left AMA without tube feeding supplies.  Presented to oncology office and ensure plus was given to patient.  Patient then taken to ED and started on osmolite 1.5.   Met with patient, Cynthia Church female friend and Cynthia Church today in clinic.  Patient reports that she has been taking 4 ensure/boost daily via g-tube.  Reports that he is flushing tube with water after she gives the tube feeding.  Reports she gives tube feeding whenever she is hungry.  Reports some constipation but is taking miralax which has helped.  No other nutrition impact symptoms reported today.    Patient asking about pain patch, nerve pil and K labs today.      Medications: reviewed  Labs: reviewed    Anthropometrics:   Height: 63 inches Weight: 79 lb 4/10 Noted weight of 125 lb on 10/08/17 BMI: 14  36 % weight loss in the last 1 1/2 months, signifcant.     Estimated Energy Needs  Kcals: 1500-1700 calories/d Protein: 65-75 g  Fluid: > 1.5 L/d  NUTRITION DIAGNOSIS: Malnutrition related to cancer as evidenced by 36% weight loss in 2 months and eating < 50% of energy needs for > or equal to 5 days.    MALNUTRITION DIAGNOSIS: Patient meets criteria for severe malnutrition in acute illness likely progressing to chronic illness as evidenced by 36% weight loss in the last 2 months and eating < or equal to 50% of energy needs for > 5 days.     INTERVENTION:  Recommend osmolite 1.5, 5 cartons  per day (8am, 11 am, 2pm, 5pm and 8pm) to better meet nutritional needs (ensure plus not sole source nutrition for long term).  Flush with 41m of water before and after feeding. Tube feeding regimen will provide 1775 calories, 74.5 g of protein and 1502mwater.   Informed patient and Cynthia Church new referral has been sent to Cynthia Church provide enteral nutrition.  Patient appreciative of service and reports "there are angels watching over me."  Patient requested that Cynthia Church contact [facility] Cynthia Church to set up delivery of supplies. Cynthia Church if he could put baby food or whey protein powder down the tube. Recommend at this time only water (patient using bottled water), osmolite and crushed medications through tube at this time.  Cynthia Church understanding.     Reviewed with patient today PEG care instructions and provided written copy to patient.   Provided patient with tube feeding regimen in writing and verbally reviewed regimen with patient.  Patient verbalized understanding as well as Cynthia Church Questions were answered. Discussed patients request of pain patch, nerve pill and K level with Church, Cynthia Church will address these request with patient today.  Contact information given  MONITORING, EVALUATION, GOAL: weight trends, intake   NEXT VISIT: phone follow-up   Cynthia Church B. Cynthia Church [phone])

## 2017-11-22 NOTE — Progress Notes (Signed)
Nutrition  Chart reviewed as patient due for nutrition appointment today.   Spoke with [place] RD and representative this am and enteral orders were not written prior to discharge from [facility] ED.    Recommend osmolite 1.5, 5 cans per day to meet nutritional needs ( 8am, 11am, 2pm, 5pm and 8pm).  Flush tube with 61ml of water before and after feeding.  Tube feeding will provide 1775 calories, 74.5 g of protein and 1522ml water with water flush and from formula. Discussed with RN, Sharyn Lull and orders to be signed by MD and faxed to [place].    Destynee Stringfellow B. Zenia Resides, [place], [place] Registered Dietitian [phone] (pager)

## 2017-11-26 ENCOUNTER — Encounter: Payer: Self-pay | Admitting: General Practice

## 2017-11-26 NOTE — Progress Notes (Signed)
[facility] Progress Notes  Call received from [place], patient is not eligible for Integrated Health Program (collaborative program w DSS and Health Department); program has served patient in the past but she does not meet current criteria for services.  Edwyna Shell, LCSW Clinical Social Worker Phone:  [phone]

## 2017-11-27 ENCOUNTER — Ambulatory Visit (HOSPITAL_COMMUNITY): Payer: Self-pay

## 2017-11-27 ENCOUNTER — Inpatient Hospital Stay (HOSPITAL_COMMUNITY): Payer: Self-pay

## 2017-11-28 ENCOUNTER — Other Ambulatory Visit (HOSPITAL_COMMUNITY): Payer: Self-pay | Admitting: Emergency Medicine

## 2017-11-28 ENCOUNTER — Encounter (HOSPITAL_COMMUNITY): Payer: Self-pay | Admitting: Emergency Medicine

## 2017-11-28 DIAGNOSIS — J392 Other diseases of pharynx: Secondary | ICD-10-CM

## 2017-11-28 MED ORDER — LIDOCAINE-PRILOCAINE 2.5-2.5 % EX CREA: TOPICAL_CREAM | CUTANEOUS | 3 refills | Status: AC

## 2017-11-28 MED ORDER — ONDANSETRON HCL 8 MG PO TABS: 8.0000 mg | ORAL_TABLET | Freq: Two times a day (BID) | ORAL | 1 refills | Status: AC | PRN

## 2017-11-28 MED ORDER — PROCHLORPERAZINE MALEATE 10 MG PO TABS: 10.0000 mg | ORAL_TABLET | Freq: Four times a day (QID) | ORAL | 1 refills | Status: AC | PRN

## 2017-11-28 NOTE — Progress Notes (Signed)
Tentative chemo schedule made.  Faxed to gina at [facility].  Pt will be CT SIM for RAD ONC on 4/30.  EST start XRT/CHEMO on 5/20.

## 2017-12-03 ENCOUNTER — Ambulatory Visit (INDEPENDENT_AMBULATORY_CARE_PROVIDER_SITE_OTHER): Payer: Medicare Other | Admitting: General Surgery

## 2017-12-03 ENCOUNTER — Encounter: Payer: Self-pay | Admitting: General Surgery

## 2017-12-03 VITALS — BP 103/72 | HR 101 | Temp 98.0°F | Ht 62.0 in | Wt 77.0 lb

## 2017-12-03 DIAGNOSIS — C099 Malignant neoplasm of tonsil, unspecified: Secondary | ICD-10-CM | POA: Diagnosis not present

## 2017-12-03 NOTE — H&P (Signed)
Cynthia Church; [id number]; [birth year]   HPI Paitent is a 52yo wf who is referred for portacath insertion by Dr. Delton Coombes, Oncology.  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  Is also undergoing XRT.  Is very anxious.  10 out 10 pain all over. Past Medical History:  Diagnosis Date  . Alcoholism ([place])   . Bipolar affective disorder, manic ([place]) 1993   [facility]'d at [facility]  . Cancer ([place]) 1989   uterine  . Chronic pain   . COPD (chronic obstructive pulmonary disease) ([place])   . Headache(784.0) 09/25/2011  . Hyperthyroidism 1993  . Low back pain   . Mental disorder   . Personality disorders 1993    Past Surgical History:  Procedure Laterality Date  . BACK SURGERY    . BREAST SURGERY  2003   to check for possible cancer  . cauterization of uterine cancer  2008  . GASTROSTOMY N/A 10/29/2017   Procedure: INSERTION OF GASTROSTOMY TUBE;  Surgeon: Georganna Skeans, MD;  Location: [facility];  Service: General;  Laterality: N/A;  . IR FLUORO RM 30-60 MIN  10/28/2017  . tubal ligaation    . TUBAL LIGATION      Family History  Problem Relation Age of Onset  . Cirrhosis Father     Current Outpatient Medications on File Prior to Visit  Medication Sig Dispense Refill  . lidocaine-prilocaine (EMLA) cream Apply to affected area once 30 g 3  . nicotine (NICODERM CQ - DOSED IN MG/24 HOURS) 21 mg/24hr patch Place 1 patch (21 mg total) onto the skin daily. 28 patch 0  . Nutritional Supplements (FEEDING SUPPLEMENT, OSMOLITE 1.5 CAL,) LIQD Give 1 carton 5 times per day (8am, 11 am, 2pm, 5pm and 8pm) in feeding tube.   Flush with 73ml of water before and after each feeding. 1185 mL 0  . ondansetron (ZOFRAN) 8 MG tablet Take 1 tablet (8 mg total) by mouth 2 (two) times daily as needed. Start on the third day after chemotherapy. 30 tablet 1  . oxyCODONE-acetaminophen (PERCOCET/ROXICET) 5-325 MG tablet Place 1 tablet into feeding tube every 6 (six) hours as  needed. 56 tablet 0  . prochlorperazine (COMPAZINE) 10 MG tablet Take 1 tablet (10 mg total) by mouth every 6 (six) hours as needed (Nausea or vomiting). 30 tablet 1   No current facility-administered medications on file prior to visit.     No Known Allergies  Social History   Substance and Sexual Activity  Alcohol Use Yes   Comment: daily    Social History   Tobacco Use  Smoking Status Current Every Day Smoker  . Packs/day: 3.00  . Years: 28.00  . Pack years: 84.00  . Types: Cigarettes  Smokeless Tobacco Never Used    Review of Systems  Constitutional: Positive for chills, malaise/fatigue and weight loss.  HENT: Positive for ear pain, sinus pain and sore throat.   Eyes: Positive for pain.  Respiratory: Positive for shortness of breath.   Cardiovascular: Negative.   Gastrointestinal: Negative.   Genitourinary: Negative.   Musculoskeletal: Positive for back pain and neck pain.  Skin: Negative.   Neurological: Negative.   Endo/Heme/Allergies: Negative.   Psychiatric/Behavioral: Negative.     Objective   Vitals:   12/03/17 1411  BP: 103/72  Pulse: (!) 101  Temp: 98 F (36.7 C)    Physical Exam  Constitutional: She is oriented to person, place, and time. She appears well-developed and well-nourished.  Cachetic  HENT:  Head: Normocephalic and atraumatic.  Cardiovascular: Normal rate, regular rhythm and normal heart sounds. Exam reveals no gallop and no friction rub.  No murmur heard. Pulmonary/Chest: Effort normal and breath sounds normal. No respiratory distress. She has no wheezes. She has no rales.  Neurological: She is alert and oriented to person, place, and time.  Skin: Skin is warm and dry.  Vitals reviewed.  Oncology notes reviewed Assessment  Tonsil cancer, need for central venous access Plan   Scheduled for portacath insertion on 12/09/17.  Risks and benefits of procedure including bleeding, infection, and pneumothorax were fully explained to the  patient, who gives informed consent.

## 2017-12-03 NOTE — Patient Instructions (Signed)
Implanted Port Insertion  Implanted port insertion is a procedure to put in a port and catheter. The port is a device with an injectable disk that can be accessed by your health care provider. The port is connected to a vein in the chest or neck by a small flexible tube (catheter). There are different types of ports. The implanted port may be used as a long-term IV access for:  · Medicines, such as chemotherapy.  · Fluids.  · Liquid nutrition, such as total parenteral nutrition (TPN).  · Blood samples.    Having a port means that your health care provider will not need to use the veins in your arms for these procedures.  Tell a health care provider about:  · Any allergies you have.  · All medicines you are taking, especially blood thinners, as well as any vitamins, herbs, eye drops, creams, over-the-counter medicines, and steroids.  · Any problems you or family members have had with anesthetic medicines.  · Any blood disorders you have.  · Any surgeries you have had.  · Any medical conditions you have, including diabetes or kidney problems.  · Whether you are pregnant or may be pregnant.  What are the risks?  Generally, this is a safe procedure. However, problems may occur, including:  · Allergic reactions to medicines or dyes.  · Damage to other structures or organs.  · Infection.  · Damage to the blood vessel, bruising, or bleeding at the puncture site.  · Blood clot.  · Breakdown of the skin over the port.  · A collection of air in the chest that can cause one of the lungs to collapse (pneumothorax). This is rare.    What happens before the procedure?  Staying hydrated  Follow instructions from your health care provider about hydration, which may include:  · Up to 2 hours before the procedure - you may continue to drink clear liquids, such as water, clear fruit juice, black coffee, and plain tea.    Eating and drinking restrictions  · Follow instructions from your health care provider about eating and drinking,  which may include:  ? 8 hours before the procedure - stop eating heavy meals or foods such as meat, fried foods, or fatty foods.  ? 6 hours before the procedure - stop eating light meals or foods, such as toast or cereal.  ? 6 hours before the procedure - stop drinking milk or drinks that contain milk.  ? 2 hours before the procedure - stop drinking clear liquids.  Medicines  · Ask your health care provider about:  ? Changing or stopping your regular medicines. This is especially important if you are taking diabetes medicines or blood thinners.  ? Taking medicines such as aspirin and ibuprofen. These medicines can thin your blood. Do not take these medicines before your procedure if your health care provider instructs you not to.  · You may be given antibiotic medicine to help prevent infection.  General instructions  · Plan to have someone take you home from the hospital or clinic.  · If you will be going home right after the procedure, plan to have someone with you for 24 hours.  · You may have blood tests.  · You may be asked to shower with a germ-killing soap.  What happens during the procedure?  · To lower your risk of infection:  ? Your health care team will wash or sanitize their hands.  ? Your skin will be washed with   soap.  ? Hair may be removed from the surgical area.  · An IV tube will be inserted into one of your veins.  · You will be given one or more of the following:  ? A medicine to help you relax (sedative).  ? A medicine to numb the area (local anesthetic).  · Two small cuts (incisions) will be made to insert the port.  ? One incision will be made in your neck to get access to the vein where the catheter will lie.  ? The other incision will be made in the upper chest. This is where the port will lie.  · The procedure may be done using continuous X-ray (fluoroscopy) or other imaging tools for guidance.  · The port and catheter will be placed. There may be a small, raised area where the port  is.  · The port will be flushed with a salt solution (saline), and blood will be drawn to make sure that it is working correctly.  · The incisions will be closed.  · Bandages (dressings) may be placed over the incisions.  The procedure may vary among health care providers and hospitals.  What happens after the procedure?  · Your blood pressure, heart rate, breathing rate, and blood oxygen level will be monitored until the medicines you were given have worn off.  · Do not drive for 24 hours if you were given a sedative.  · You will be given a manufacturer's information card for the type of port that you have. Keep this with you.  · Your port will need to be flushed and checked as told by your health care provider, usually every few weeks.  · A chest X-ray will be done to:  ? Check the placement of the port.  ? Make sure there is no injury to your lung.  Summary  · Implanted port insertion is a procedure to put in a port and catheter.  · The implanted port is used as a long-term IV access.  · The port will need to be flushed and checked as told by your health care provider, usually every few weeks.  · Keep your manufacturer's information card with you at all times.  This information is not intended to replace advice given to you by your health care provider. Make sure you discuss any questions you have with your health care provider.  Document Released: 05/20/2013 Document Revised: 06/20/2016 Document Reviewed: 06/20/2016  Elsevier Interactive Patient Education © 2017 [employer]vier Inc.

## 2017-12-03 NOTE — Progress Notes (Signed)
Cynthia Church; [id number]; 03/10/1966   HPI Paitent is a 52yo wf who is referred for portacath insertion by Dr. Delton Coombes, Oncology.  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  Is also undergoing XRT.  Is very anxious.  10 out 10 pain all over. Past Medical History:  Diagnosis Date  . Alcoholism ([place])   . Bipolar affective disorder, manic ([place]) 1993   [facility]'d at [facility]  . Cancer ([place]) 1989   uterine  . Chronic pain   . COPD (chronic obstructive pulmonary disease) ([place])   . Headache(784.0) 09/25/2011  . Hyperthyroidism 1993  . Low back pain   . Mental disorder   . Personality disorders 1993    Past Surgical History:  Procedure Laterality Date  . BACK SURGERY    . BREAST SURGERY  2003   to check for possible cancer  . cauterization of uterine cancer  2008  . GASTROSTOMY N/A 10/29/2017   Procedure: INSERTION OF GASTROSTOMY TUBE;  Surgeon: Georganna Skeans, MD;  Location: [facility];  Service: General;  Laterality: N/A;  . IR FLUORO RM 30-60 MIN  10/28/2017  . tubal ligaation    . TUBAL LIGATION      Family History  Problem Relation Age of Onset  . Cirrhosis Father     Current Outpatient Medications on File Prior to Visit  Medication Sig Dispense Refill  . lidocaine-prilocaine (EMLA) cream Apply to affected area once 30 g 3  . nicotine (NICODERM CQ - DOSED IN MG/24 HOURS) 21 mg/24hr patch Place 1 patch (21 mg total) onto the skin daily. 28 patch 0  . Nutritional Supplements (FEEDING SUPPLEMENT, OSMOLITE 1.5 CAL,) LIQD Give 1 carton 5 times per day (8am, 11 am, 2pm, 5pm and 8pm) in feeding tube.   Flush with 19ml of water before and after each feeding. 1185 mL 0  . ondansetron (ZOFRAN) 8 MG tablet Take 1 tablet (8 mg total) by mouth 2 (two) times daily as needed. Start on the third day after chemotherapy. 30 tablet 1  . oxyCODONE-acetaminophen (PERCOCET/ROXICET) 5-325 MG tablet Place 1 tablet into feeding tube every 6 (six) hours as  needed. 56 tablet 0  . prochlorperazine (COMPAZINE) 10 MG tablet Take 1 tablet (10 mg total) by mouth every 6 (six) hours as needed (Nausea or vomiting). 30 tablet 1   No current facility-administered medications on file prior to visit.     No Known Allergies  Social History   Substance and Sexual Activity  Alcohol Use Yes   Comment: daily    Social History   Tobacco Use  Smoking Status Current Every Day Smoker  . Packs/day: 3.00  . Years: 28.00  . Pack years: 84.00  . Types: Cigarettes  Smokeless Tobacco Never Used    Review of Systems  Constitutional: Positive for chills, malaise/fatigue and weight loss.  HENT: Positive for ear pain, sinus pain and sore throat.   Eyes: Positive for pain.  Respiratory: Positive for shortness of breath.   Cardiovascular: Negative.   Gastrointestinal: Negative.   Genitourinary: Negative.   Musculoskeletal: Positive for back pain and neck pain.  Skin: Negative.   Neurological: Negative.   Endo/Heme/Allergies: Negative.   Psychiatric/Behavioral: Negative.     Objective   Vitals:   12/03/17 1411  BP: 103/72  Pulse: (!) 101  Temp: 98 F (36.7 C)    Physical Exam  Constitutional: She is oriented to person, place, and time. She appears well-developed and well-nourished.  Cachetic  HENT:  Head: Normocephalic and atraumatic.  Cardiovascular: Normal rate, regular rhythm and normal heart sounds. Exam reveals no gallop and no friction rub.  No murmur heard. Pulmonary/Chest: Effort normal and breath sounds normal. No respiratory distress. She has no wheezes. She has no rales.  Neurological: She is alert and oriented to person, place, and time.  Skin: Skin is warm and dry.  Vitals reviewed.  Oncology notes reviewed Assessment  Tonsil cancer, need for central venous access Plan   Scheduled for portacath insertion on 12/09/17.  Risks and benefits of procedure including bleeding, infection, and pneumothorax were fully explained to the  patient, who gives informed consent.

## 2017-12-04 ENCOUNTER — Ambulatory Visit (HOSPITAL_COMMUNITY): Payer: Self-pay

## 2017-12-04 ENCOUNTER — Ambulatory Visit (HOSPITAL_COMMUNITY): Payer: Self-pay | Admitting: Hematology

## 2017-12-04 NOTE — Patient Instructions (Signed)
Cynthia Church  12/04/2017     @PREFPERIOPPHARMACY @   Your procedure is scheduled on  12/09/2017  Report to [facility] at  615  A.M.  Call this number if you have problems the morning of surgery:  [phone]   Remember:  Do not eat food or drink liquids after midnight.  Take these medicines the morning of surgery with A SIP OF WATER  zofran or compazine, oxycodone.   Do not wear jewelry, make-up or nail polish.  Do not wear lotions, powders, or perfumes, or deodorant.  Do not shave 48 hours prior to surgery.  Men may shave face and neck.  Do not bring valuables to the hospital.  [facility] is not responsible for any belongings or valuables.  Contacts, dentures or bridgework may not be worn into surgery.  Leave your suitcase in the car.  After surgery it may be brought to your room.  For patients admitted to the hospital, discharge time will be determined by your treatment team.  Patients discharged the day of surgery will not be allowed to drive home.   Name and phone number of your driver:   family Special instructions:  None  Please read over the following fact sheets that you were given. Anesthesia Post-op Instructions and Care and Recovery After Surgery       Implanted Port Removal Implanted port removal is a procedure to remove the port and catheter (port-a-cath) that is implanted under your skin. The port is a small disc under your skin that can be punctured with a needle. It is connected to a vein in your chest or neck by a small flexible tube (catheter). The port-a-cath is used for treatment through an IV tube and for taking blood samples. Your health care provider will remove the port-a-cath if:  You no longer need it for treatment.  It is not working properly.  The area around it gets infected.  Tell a health care provider about:  Any allergies you have.  All medicines you are taking, including vitamins, herbs, eye drops, creams,  and over-the-counter medicines.  Any problems you or family members have had with anesthetic medicines.  Any blood disorders you have.  Any surgeries you have had.  Any medical conditions you have.  Whether you are pregnant or may be pregnant. What are the risks? Generally, this is a safe procedure. However, problems may occur, including:  Infection.  Bleeding.  Allergic reactions to anesthetic medicines.  Damage to nerves or blood vessels.  What happens before the procedure?  You will have: ? A physical exam. ? Blood tests. ? Imaging tests, including a chest X-ray.  Follow instructions from your health care provider about eating or drinking restrictions.  Ask your health care provider about: ? Changing or stopping your regular medicines. This is especially important if you are taking diabetes medicines or blood thinners. ? Taking medicines such as aspirin and ibuprofen. These medicines can thin your blood. Do not take these medicines before your procedure if your surgeon instructs you not to.  Ask your health care provider how your surgical site will be marked or identified.  You may be given antibiotic medicine to help prevent infection.  Plan to have someone take you home after the procedure.  If you will be going home right after the procedure, plan to have someone stay with you for 24 hours. What happens during the procedure?  To reduce  your risk of infection: ? Your health care team will wash or sanitize their hands. ? Your skin will be washed with soap.  You may be given one or more of the following: ? A medicine to help you relax (sedative). ? A medicine to numb the area (local anesthetic).  A small cut (incision) will be made at the site of your port-a-cath.  The port-a-cath and the catheter that has been inside your vein will gently be removed.  The incision will be closed with stitches (sutures), adhesive strips, or skin glue.  A bandage (dressing)  will be placed over the incision. The procedure may vary among health care providers and hospitals. What happens after the procedure?  Your blood pressure, heart rate, breathing rate, and blood oxygen level will be monitored often until the medicines you were given have worn off.  Do not drive for 24 hours if you received a sedative. This information is not intended to replace advice given to you by your health care provider. Make sure you discuss any questions you have with your health care provider. Document Released: 07/11/2015 Document Revised: 01/05/2016 Document Reviewed: 05/04/2015 Elsevier Interactive Patient Education  2018 [place], Care After This sheet gives you information about how to care for yourself after your procedure. Your health care provider may also give you more specific instructions. If you have problems or questions, contact your health care provider. What can I expect after the procedure? After your procedure, it is common to have:  Discomfort at the port insertion site.  Bruising on the skin over the port. This should improve over 3-4 days.  Follow these instructions at home: [facility] care  After your port is placed, you will get a manufacturer's information card. The card has information about your port. Keep this card with you at all times.  Take care of the port as told by your health care provider. Ask your health care provider if you or a family member can get training for taking care of the port at home. A home health care nurse may also take care of the port.  Make sure to remember what type of port you have. Incision care  Follow instructions from your health care provider about how to take care of your port insertion site. Make sure you: ? Wash your hands with soap and water before you change your bandage (dressing). If soap and water are not available, use hand sanitizer. ? Change your dressing as told by your health  care provider. ? Leave stitches (sutures), skin glue, or adhesive strips in place. These skin closures may need to stay in place for 2 weeks or longer. If adhesive strip edges start to loosen and curl up, you may trim the loose edges. Do not remove adhesive strips completely unless your health care provider tells you to do that.  Check your port insertion site every day for signs of infection. Check for: ? More redness, swelling, or pain. ? More fluid or blood. ? Warmth. ? Pus or a bad smell. General instructions  Do not take baths, swim, or use a hot tub until your health care provider approves.  Do not lift anything that is heavier than 10 lb (4.5 kg) for a week, or as told by your health care provider.  Ask your health care provider when it is okay to: ? Return to work or school. ? Resume usual physical activities or sports.  Do not drive for 24 hours if  you were given a medicine to help you relax (sedative).  Take over-the-counter and prescription medicines only as told by your health care provider.  Wear a medical alert bracelet in case of an emergency. This will tell any health care providers that you have a port.  Keep all follow-up visits as told by your health care provider. This is important. Contact a health care provider if:  You cannot flush your port with saline as directed, or you cannot draw blood from the port.  You have a fever or chills.  You have more redness, swelling, or pain around your port insertion site.  You have more fluid or blood coming from your port insertion site.  Your port insertion site feels warm to the touch.  You have pus or a bad smell coming from the port insertion site. Get help right away if:  You have chest pain or shortness of breath.  You have bleeding from your port that you cannot control. Summary  Take care of the port as told by your health care provider.  Change your dressing as told by your health care  provider.  Keep all follow-up visits as told by your health care provider. This information is not intended to replace advice given to you by your health care provider. Make sure you discuss any questions you have with your health care provider. Document Released: 05/20/2013 Document Revised: 06/20/2016 Document Reviewed: 06/20/2016 Elsevier Interactive Patient Education  2017 [place] An implanted port is a type of central line that is placed under the skin. Central lines are used to provide IV access when treatment or nutrition needs to be given through a person's veins. Implanted ports are used for long-term IV access. An implanted port may be placed because:  You need IV medicine that would be irritating to the small veins in your hands or arms.  You need long-term IV medicines, such as antibiotics.  You need IV nutrition for a long period.  You need frequent blood draws for lab tests.  You need dialysis.  Implanted ports are usually placed in the chest area, but they can also be placed in the upper arm, the abdomen, or the leg. An implanted port has two main parts:  Reservoir. The reservoir is round and will appear as a small, raised area under your skin. The reservoir is the part where a needle is inserted to give medicines or draw blood.  Catheter. The catheter is a thin, flexible tube that extends from the reservoir. The catheter is placed into a large vein. Medicine that is inserted into the reservoir goes into the catheter and then into the vein.  How will I care for my incision site? Do not get the incision site wet. Bathe or shower as directed by your health care provider. How is my port accessed? Special steps must be taken to access the port:  Before the port is accessed, a numbing cream can be placed on the skin. This helps numb the skin over the port site.  Your health care provider uses a sterile technique to access the port. ? Your  health care provider must put on a mask and sterile gloves. ? The skin over your port is cleaned carefully with an antiseptic and allowed to dry. ? The port is gently pinched between sterile gloves, and a needle is inserted into the port.  Only "non-coring" port needles should be used to access the port. Once the port is accessed,  a blood return should be checked. This helps ensure that the port is in the vein and is not clogged.  If your port needs to remain accessed for a constant infusion, a clear (transparent) bandage will be placed over the needle site. The bandage and needle will need to be changed every week, or as directed by your health care provider.  Keep the bandage covering the needle clean and dry. Do not get it wet. Follow your health care provider's instructions on how to take a shower or bath while the port is accessed.  If your port does not need to stay accessed, no bandage is needed over the port.  What is flushing? Flushing helps keep the port from getting clogged. Follow your health care provider's instructions on how and when to flush the port. Ports are usually flushed with saline solution or a medicine called heparin. The need for flushing will depend on how the port is used.  If the port is used for intermittent medicines or blood draws, the port will need to be flushed: ? After medicines have been given. ? After blood has been drawn. ? As part of routine maintenance.  If a constant infusion is running, the port may not need to be flushed.  How long will my port stay implanted? The port can stay in for as long as your health care provider thinks it is needed. When it is time for the port to come out, surgery will be done to remove it. The procedure is similar to the one performed when the port was put in. When should I seek immediate medical care? When you have an implanted port, you should seek immediate medical care if:  You notice a bad smell coming from the  incision site.  You have swelling, redness, or drainage at the incision site.  You have more swelling or pain at the port site or the surrounding area.  You have a fever that is not controlled with medicine.  This information is not intended to replace advice given to you by your health care provider. Make sure you discuss any questions you have with your health care provider. Document Released: 07/30/2005 Document Revised: 01/05/2016 Document Reviewed: 04/06/2013 Elsevier Interactive Patient Education  2017 [place] Anesthesia is a term that refers to techniques, procedures, and medicines that help a person stay safe and comfortable during a medical procedure. Monitored anesthesia care, or sedation, is one type of anesthesia. Your anesthesia specialist may recommend sedation if you will be having a procedure that does not require you to be unconscious, such as:  Cataract surgery.  A dental procedure.  A biopsy.  A colonoscopy.  During the procedure, you may receive a medicine to help you relax (sedative). There are three levels of sedation:  Mild sedation. At this level, you may feel awake and relaxed. You will be able to follow directions.  Moderate sedation. At this level, you will be sleepy. You may not remember the procedure.  Deep sedation. At this level, you will be asleep. You will not remember the procedure.  The more medicine you are given, the deeper your level of sedation will be. Depending on how you respond to the procedure, the anesthesia specialist may change your level of sedation or the type of anesthesia to fit your needs. An anesthesia specialist will monitor you closely during the procedure. Let your health care provider know about:  Any allergies you have.  All medicines you are taking,  including vitamins, herbs, eye drops, creams, and over-the-counter medicines.  Any use of steroids (by mouth or as a cream).  Any problems  you or family members have had with sedatives and anesthetic medicines.  Any blood disorders you have.  Any surgeries you have had.  Any medical conditions you have, such as sleep apnea.  Whether you are pregnant or may be pregnant.  Any use of cigarettes, alcohol, or street drugs. What are the risks? Generally, this is a safe procedure. However, problems may occur, including:  Getting too much medicine (oversedation).  Nausea.  Allergic reaction to medicines.  Trouble breathing. If this happens, a breathing tube may be used to help with breathing. It will be removed when you are awake and breathing on your own.  Heart trouble.  Lung trouble.  Before the procedure Staying hydrated Follow instructions from your health care provider about hydration, which may include:  Up to 2 hours before the procedure - you may continue to drink clear liquids, such as water, clear fruit juice, black coffee, and plain tea.  Eating and drinking restrictions Follow instructions from your health care provider about eating and drinking, which may include:  8 hours before the procedure - stop eating heavy meals or foods such as meat, fried foods, or fatty foods.  6 hours before the procedure - stop eating light meals or foods, such as toast or cereal.  6 hours before the procedure - stop drinking milk or drinks that contain milk.  2 hours before the procedure - stop drinking clear liquids.  Medicines Ask your health care provider about:  Changing or stopping your regular medicines. This is especially important if you are taking diabetes medicines or blood thinners.  Taking medicines such as aspirin and ibuprofen. These medicines can thin your blood. Do not take these medicines before your procedure if your health care provider instructs you not to.  Tests and exams  You will have a physical exam.  You may have blood tests done to show: ? How well your kidneys and liver are  working. ? How well your blood can clot.  General instructions  Plan to have someone take you home from the hospital or clinic.  If you will be going home right after the procedure, plan to have someone with you for 24 hours.  What happens during the procedure?  Your blood pressure, heart rate, breathing, level of pain and overall condition will be monitored.  An IV tube will be inserted into one of your veins.  Your anesthesia specialist will give you medicines as needed to keep you comfortable during the procedure. This may mean changing the level of sedation.  The procedure will be performed. After the procedure  Your blood pressure, heart rate, breathing rate, and blood oxygen level will be monitored until the medicines you were given have worn off.  Do not drive for 24 hours if you received a sedative.  You may: ? Feel sleepy, clumsy, or nauseous. ? Feel forgetful about what happened after the procedure. ? Have a sore throat if you had a breathing tube during the procedure. ? Vomit. This information is not intended to replace advice given to you by your health care provider. Make sure you discuss any questions you have with your health care provider. Document Released: 04/25/2005 Document Revised: 01/06/2016 Document Reviewed: 11/20/2015 Elsevier Interactive Patient Education  2018 [place], Care After These instructions provide you with information about caring for yourself after your  procedure. Your health care provider may also give you more specific instructions. Your treatment has been planned according to current medical practices, but problems sometimes occur. Call your health care provider if you have any problems or questions after your procedure. What can I expect after the procedure? After your procedure, it is common to:  Feel sleepy for several hours.  Feel clumsy and have poor balance for several hours.  Feel forgetful about  what happened after the procedure.  Have poor judgment for several hours.  Feel nauseous or vomit.  Have a sore throat if you had a breathing tube during the procedure.  Follow these instructions at home: For at least 24 hours after the procedure:   Do not: ? Participate in activities in which you could fall or become injured. ? Drive. ? Use heavy machinery. ? Drink alcohol. ? Take sleeping pills or medicines that cause drowsiness. ? Make important decisions or sign legal documents. ? Take care of children on your own.  Rest. Eating and drinking  Follow the diet that is recommended by your health care provider.  If you vomit, drink water, juice, or soup when you can drink without vomiting.  Make sure you have little or no nausea before eating solid foods. General instructions  Have a responsible adult stay with you until you are awake and alert.  Take over-the-counter and prescription medicines only as told by your health care provider.  If you smoke, do not smoke without supervision.  Keep all follow-up visits as told by your health care provider. This is important. Contact a health care provider if:  You keep feeling nauseous or you keep vomiting.  You feel light-headed.  You develop a rash.  You have a fever. Get help right away if:  You have trouble breathing. This information is not intended to replace advice given to you by your health care provider. Make sure you discuss any questions you have with your health care provider. Document Released: 11/20/2015 Document Revised: 03/21/2016 Document Reviewed: 11/20/2015 Elsevier Interactive Patient Education  [employer].

## 2017-12-05 ENCOUNTER — Encounter (HOSPITAL_COMMUNITY)
Admission: RE | Admit: 2017-12-05 | Discharge: 2017-12-05 | Disposition: A | Discharge status: Home / Self Care | Payer: Medicare Other | Source: Ambulatory Visit | Attending: General Surgery | Admitting: General Surgery

## 2017-12-05 DIAGNOSIS — Z01818 Encounter for other preprocedural examination: Secondary | ICD-10-CM | POA: Insufficient documentation

## 2017-12-05 NOTE — Pre-Procedure Instructions (Signed)
Called number listed to go over information for upcoming surgery on Monday. "Her friend" answered the phone and states,"she aint here... She's gone on a tear and I dont know where she is or when she will be back. I can't help you". He states if "she shows back up I will be the one to bring her, if she will let me". Since he was not a family member, I did not go over any information with him and told him to call us back when she returns or have her call us back. He stated he would. Dr Arnoldo Morale made aware of the above information.

## 2017-12-06 ENCOUNTER — Telehealth (HOSPITAL_COMMUNITY): Payer: Self-pay

## 2017-12-06 ENCOUNTER — Encounter (HOSPITAL_COMMUNITY): Payer: Self-pay

## 2017-12-06 NOTE — Telephone Encounter (Signed)
Nutrition  Called for nutrition follow-up and left message with Blanche East.  [name] and patient not available but had gone into town for some business per [employer]. Elenore Rota reports that he will have [name] call RD back.  Amberlee Garvey B. Zenia Resides, [place], [place] Registered Dietitian [phone] (pager)

## 2017-12-06 NOTE — Telephone Encounter (Signed)
Nutrition Follow-up:  Received call back from [name], [place] for patient.  Reports that patient took 5 cans of osmolite 1.5 yesterday.  Unsure prior to that because she was gone from his house for about 3 days.  Reports that patient is flush tube with water before and after feedings and thinks she may be giving little bit more water at times.  Unable to tell RD how much water patient is giving via tube.  Reports that patient is tolerating tube feeding.    Reports that he has plenty of formula and tube feeding supplies from [place] currently.   Medications: reviewed  Labs: no recent  Anthropometrics:   Weight decreased to 77 lb on 4/23 from 79 lb on 4/10   Estimated Energy Needs  Kcals: 1500-1700 calories/d Protein: 65-75 g Fluid: > 1.5 L/d  NUTRITION DIAGNOSIS: Malnutrition continues   MALNUTRITION DIAGNOSIS: severe malnutrition continues   INTERVENTION:   Continue osmolite 1.5, 5 cartons per day with water flush of 31ml before and after.   Discussed signs/symptoms when patient needs more water through tube with Eddie Dibbles and he will discuss with patient.     MONITORING, EVALUATION, GOAL: weight trends, TF tolerance   NEXT VISIT: phone follow-up in 2 weeks  Artemisia Auvil B. Zenia Resides, [place], [place] Registered Dietitian [phone] (pager)

## 2017-12-08 ENCOUNTER — Encounter (HOSPITAL_COMMUNITY): Payer: Self-pay | Admitting: Anesthesiology

## 2017-12-09 ENCOUNTER — Encounter (HOSPITAL_COMMUNITY): Payer: Self-pay | Admitting: *Deleted

## 2017-12-09 ENCOUNTER — Encounter (HOSPITAL_COMMUNITY)
Admission: RE | Disposition: A | Discharge status: Home / Self Care | Payer: Self-pay | Source: Ambulatory Visit | Attending: General Surgery

## 2017-12-09 ENCOUNTER — Ambulatory Visit (HOSPITAL_COMMUNITY)
Admission: RE | Admit: 2017-12-09 | Discharge: 2017-12-09 | Disposition: A | Discharge status: Home / Self Care | Payer: Medicare Other | Source: Ambulatory Visit | Attending: General Surgery | Admitting: General Surgery

## 2017-12-09 DIAGNOSIS — Z931 Gastrostomy status: Secondary | ICD-10-CM | POA: Insufficient documentation

## 2017-12-09 DIAGNOSIS — R51 Headache: Secondary | ICD-10-CM | POA: Diagnosis not present

## 2017-12-09 DIAGNOSIS — F609 Personality disorder, unspecified: Secondary | ICD-10-CM | POA: Diagnosis not present

## 2017-12-09 DIAGNOSIS — E0590 Thyrotoxicosis, unspecified without thyrotoxic crisis or storm: Secondary | ICD-10-CM | POA: Insufficient documentation

## 2017-12-09 DIAGNOSIS — F99 Mental disorder, not otherwise specified: Secondary | ICD-10-CM | POA: Insufficient documentation

## 2017-12-09 DIAGNOSIS — Z5309 Procedure and treatment not carried out because of other contraindication: Secondary | ICD-10-CM | POA: Insufficient documentation

## 2017-12-09 DIAGNOSIS — J449 Chronic obstructive pulmonary disease, unspecified: Secondary | ICD-10-CM | POA: Diagnosis not present

## 2017-12-09 DIAGNOSIS — F17210 Nicotine dependence, cigarettes, uncomplicated: Secondary | ICD-10-CM | POA: Insufficient documentation

## 2017-12-09 DIAGNOSIS — C099 Malignant neoplasm of tonsil, unspecified: Secondary | ICD-10-CM | POA: Insufficient documentation

## 2017-12-09 DIAGNOSIS — F1020 Alcohol dependence, uncomplicated: Secondary | ICD-10-CM | POA: Insufficient documentation

## 2017-12-09 DIAGNOSIS — Z8542 Personal history of malignant neoplasm of other parts of uterus: Secondary | ICD-10-CM | POA: Diagnosis not present

## 2017-12-09 DIAGNOSIS — M545 Low back pain: Secondary | ICD-10-CM | POA: Insufficient documentation

## 2017-12-09 DIAGNOSIS — G8929 Other chronic pain: Secondary | ICD-10-CM | POA: Insufficient documentation

## 2017-12-09 DIAGNOSIS — F319 Bipolar disorder, unspecified: Secondary | ICD-10-CM | POA: Diagnosis not present

## 2017-12-09 DIAGNOSIS — C801 Malignant (primary) neoplasm, unspecified: Secondary | ICD-10-CM | POA: Diagnosis present

## 2017-12-09 DIAGNOSIS — Z79899 Other long term (current) drug therapy: Secondary | ICD-10-CM | POA: Insufficient documentation

## 2017-12-09 DIAGNOSIS — Z8379 Family history of other diseases of the digestive system: Secondary | ICD-10-CM | POA: Diagnosis not present

## 2017-12-09 LAB — RAPID URINE DRUG SCREEN, HOSP PERFORMED
AMPHETAMINES: NOT DETECTED
Barbiturates: NOT DETECTED
Benzodiazepines: NOT DETECTED
Cocaine: POSITIVE — AB
OPIATES: NOT DETECTED
Tetrahydrocannabinol: POSITIVE — AB

## 2017-12-09 SURGERY — INSERTION, TUNNELED CENTRAL VENOUS DEVICE, WITH PORT
Anesthesia: Monitor Anesthesia Care | Laterality: Left

## 2017-12-09 MED ORDER — CHLORHEXIDINE GLUCONATE CLOTH 2 % EX PADS: 6.0000 | MEDICATED_PAD | Freq: Once | CUTANEOUS | Status: DC

## 2017-12-09 MED ORDER — LIDOCAINE HCL (PF) 1 % IJ SOLN
INTRAMUSCULAR | Status: AC
Start: 2017-12-09 — End: 2017-12-09
  Filled 2017-12-09: qty 30

## 2017-12-09 MED ORDER — HEPARIN SOD (PORK) LOCK FLUSH 100 UNIT/ML IV SOLN
INTRAVENOUS | Status: AC
Start: 2017-12-09 — End: 2017-12-09
  Filled 2017-12-09: qty 5

## 2017-12-09 MED ORDER — CEFAZOLIN SODIUM-DEXTROSE 2-4 GM/100ML-% IV SOLN
2.0000 g | INTRAVENOUS | Status: DC
  Filled 2017-12-09: qty 100

## 2017-12-09 NOTE — Progress Notes (Signed)
Case cancelled per Dr. Arnoldo Morale due to positive cocaine results, patient has been informed by physician and informed that she has to be off cocaine in order to have surgery. Patient and friends are receptive of this information.

## 2017-12-09 NOTE — Progress Notes (Signed)
Surgery cancelled due to testing positive for cocaine.  Will reschedule.  Patient instructed to obstain from cocaine use.

## 2017-12-10 ENCOUNTER — Encounter: Payer: Self-pay | Admitting: General Practice

## 2017-12-10 NOTE — Progress Notes (Signed)
[facility] CC Progress Notes  CSW discussed case w supervisor, Nathaniel Man, and [facility] [employer] to determine if any social work interventions were needed at this time.  Per both parties, if patient is competent and able to her own decisions about behaviors and treatments, then no APS report need to be made.  There are no other services, other than home health services, available to patient at this time.  Patient has been declined [place] (partnership w DSS and Dept of Health) as she has declined their services multiple times in the past.  Home health services appear to be in place at this time and can support patient as needed.  No further [place] work interventions were advised by either party.    Edwyna Shell, LCSW Clinical Social Worker Phone:  [phone]

## 2017-12-13 ENCOUNTER — Other Ambulatory Visit: Payer: Self-pay

## 2017-12-13 ENCOUNTER — Inpatient Hospital Stay (HOSPITAL_COMMUNITY): Payer: Medicare Other

## 2017-12-13 ENCOUNTER — Inpatient Hospital Stay (HOSPITAL_COMMUNITY): Payer: Medicare Other | Attending: Hematology | Admitting: Hematology

## 2017-12-13 ENCOUNTER — Encounter (HOSPITAL_COMMUNITY): Payer: Self-pay | Admitting: Hematology

## 2017-12-13 VITALS — BP 79/57 | HR 100 | Temp 98.1°F | Resp 20 | Ht 64.0 in | Wt 73.6 lb

## 2017-12-13 DIAGNOSIS — F17210 Nicotine dependence, cigarettes, uncomplicated: Secondary | ICD-10-CM | POA: Diagnosis not present

## 2017-12-13 DIAGNOSIS — F1410 Cocaine abuse, uncomplicated: Secondary | ICD-10-CM

## 2017-12-13 DIAGNOSIS — F1290 Cannabis use, unspecified, uncomplicated: Secondary | ICD-10-CM | POA: Insufficient documentation

## 2017-12-13 DIAGNOSIS — F1020 Alcohol dependence, uncomplicated: Secondary | ICD-10-CM

## 2017-12-13 DIAGNOSIS — Z79899 Other long term (current) drug therapy: Secondary | ICD-10-CM | POA: Insufficient documentation

## 2017-12-13 DIAGNOSIS — G8929 Other chronic pain: Secondary | ICD-10-CM | POA: Insufficient documentation

## 2017-12-13 DIAGNOSIS — C760 Malignant neoplasm of head, face and neck: Secondary | ICD-10-CM | POA: Diagnosis not present

## 2017-12-13 DIAGNOSIS — Z9119 Patient's noncompliance with other medical treatment and regimen: Secondary | ICD-10-CM | POA: Diagnosis not present

## 2017-12-13 DIAGNOSIS — Z9114 Patient's other noncompliance with medication regimen: Secondary | ICD-10-CM | POA: Insufficient documentation

## 2017-12-13 DIAGNOSIS — H9201 Otalgia, right ear: Secondary | ICD-10-CM | POA: Diagnosis not present

## 2017-12-13 NOTE — Progress Notes (Signed)
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  CURRENT THERAPY: Has not started treatment    INTERVAL HISTORY:  Cynthia Church 52 y.o. female returns for routine follow-up for H&N cancer.   Here today with the person she lives with.   Her right ear is hurting very badly today.  This is her biggest complaint. The pain is worst "when the wind blows in it."  She is doing about 4 cans per day of tube feeding. Encouraged her to increase her water flushes given lower BP today (SBP 80s).    Discussed with patient concerns that Dr. Quitman Livings (radiation oncologist in [place] at [facility]) has communicated with Dr. Delton Coombes. Dr. Quitman Livings does not feel comfortable treating Cynthia Church given her non-compliance, diminished performance status, and advanced/progressed disease.  Given that radiation is no longer an option for her, systemic chemotherapy would offer very little benefit for her.  Discussed option for home vs inpatient hospice to better control her symptoms.  Shared with patient and family, discussed life expectancy which we anticipate is < 3 months at this time.     Patient agrees with hospice referral.    Patient's house mate asked to speak with [place] without the patient present.  He is concerned about being able to care for her at his home any longer.      REVIEW OF SYSTEMS:  Review of Systems  Constitutional: Positive for appetite change, fatigue and unexpected weight change.  HENT:   Positive for trouble swallowing.        Pain in the right ear present.  Neurological: Positive for dizziness.  All other systems reviewed and are negative.    PAST MEDICAL/SURGICAL HISTORY:  Past Medical History:  Diagnosis Date  . Alcoholism ([place])   . Bipolar affective disorder, manic ([place]) 1993   Dx'd at  [facility]  . Cancer ([place]) 1989   uterine  . Chronic pain   . COPD (chronic obstructive pulmonary disease) ([place])   . Headache(784.0) 09/25/2011  . Hyperthyroidism 1993  . Low back pain   . Mental disorder   . Personality disorders 1993   Past Surgical History:  Procedure Laterality Date  . BACK SURGERY    . BREAST SURGERY  2003   to check for possible cancer  . cauterization of uterine cancer  2008  . GASTROSTOMY N/A 10/29/2017   Procedure: INSERTION OF GASTROSTOMY TUBE;  Surgeon: Georganna Skeans, MD;  Location: [place];  Service: General;  Laterality: N/A;  . IR FLUORO RM 30-60 MIN  10/28/2017  . tubal ligaation    . TUBAL LIGATION       SOCIAL HISTORY:  Social History   Socioeconomic History  . Marital status: Divorced    Spouse name: Not on file  . Number of children: Not on file  . Years of education: Not on file  . Highest education level: Not on file  Occupational History  . Not on file  Social Needs  . Financial resource strain: Not on file  . Food insecurity:    Worry: Not on file    Inability: Not on file  . Transportation needs:    Medical: Not on file    Non-medical: Not on file  Tobacco Use  . Smoking status: Current Every Day Smoker  Packs/day: 3.00    Years: 28.00    Pack years: 84.00    Types: Cigarettes  . Smokeless tobacco: Never Used  Substance and Sexual Activity  . Alcohol use: Yes    Comment: daily  . Drug use: Yes    Types: Marijuana, Cocaine    Comment: yesterday  . Sexual activity: Never  Lifestyle  . Physical activity:    Days per week: Not on file    Minutes per session: Not on file  . Stress: Not on file  Relationships  . Social connections:    Talks on phone: Not on file    Gets together: Not on file    Attends religious service: Not on file    Active member of club or organization: Not on file    Attends meetings of clubs or organizations: Not on file    Relationship status: Not on file  . Intimate partner violence:      Fear of current or ex partner: Not on file    Emotionally abused: Not on file    Physically abused: Not on file    Forced sexual activity: Not on file  Other Topics Concern  . Not on file  Social History Narrative  . Not on file    FAMILY HISTORY:  Family History  Problem Relation Age of Onset  . Cirrhosis Father     CURRENT MEDICATIONS:  Outpatient Encounter Medications as of 12/13/2017  Medication Sig Note  . lidocaine-prilocaine (EMLA) cream Apply to affected area once 12/09/2017: Have not started  . nicotine (NICODERM CQ - DOSED IN MG/24 HOURS) 21 mg/24hr patch Place 1 patch (21 mg total) onto the skin daily.   . Nutritional Supplements (FEEDING SUPPLEMENT, OSMOLITE 1.5 CAL,) LIQD Give 1 carton 5 times per day (8am, 11 am, 2pm, 5pm and 8pm) in feeding tube.   Flush with 30ml of water before and after each feeding.   . ondansetron (ZOFRAN) 8 MG tablet Take 1 tablet (8 mg total) by mouth 2 (two) times daily as needed. Start on the third day after chemotherapy. 12/09/2017: Have not started  . oxyCODONE-acetaminophen (PERCOCET/ROXICET) 5-325 MG tablet Place 1 tablet into feeding tube every 6 (six) hours as needed.   . prochlorperazine (COMPAZINE) 10 MG tablet Take 1 tablet (10 mg total) by mouth every 6 (six) hours as needed (Nausea or vomiting). 12/09/2017: Have not started   No facility-administered encounter medications on file as of 12/13/2017.     ALLERGIES:  No Known Allergies   PHYSICAL EXAM:  ECOG Performance status: 2  We have reviewed her vital signs today.   LABORATORY DATA:  I have reviewed the labs as listed.  CBC    Component Value Date/Time   WBC 6.0 11/07/2017 1556   RBC 4.25 11/07/2017 1556   HGB 12.9 11/07/2017 1556   HCT 40.9 11/07/2017 1556   PLT 360 11/07/2017 1556   MCV 96.2 11/07/2017 1556   MCH 30.4 11/07/2017 1556   MCHC 31.5 11/07/2017 1556   RDW 14.8 11/07/2017 1556   LYMPHSABS 1.2 11/06/2017 0059   MONOABS 0.4 11/06/2017 0059   EOSABS  0.0 11/06/2017 0059   BASOSABS 0.0 11/06/2017 0059   CMP Latest Ref Rng & Units 11/07/2017 11/06/2017 11/03/2017  Glucose 65 - 99 mg/dL 126(H) 92 102(H)  BUN 6 - 20 mg/dL 13 12 8   Creatinine 0.44 - 1.00 mg/dL 0.63 0.57 0.56  Sodium 135 - 145 mmol/L 132(L) 132(L) 134(L)  Potassium 3.5 - 5.1 mmol/L 3.8 3.0(L)  3.5  Chloride 101 - 111 mmol/L 97(L) 96(L) 95(L)  CO2 22 - 32 mmol/L 27 26 27   Calcium 8.9 - 10.3 mg/dL 8.4(L) 8.4(L) 8.6(L)  Total Protein 6.5 - 8.1 g/dL - 6.5 7.2  Total Bilirubin 0.3 - 1.2 mg/dL - 0.8 1.0  Alkaline Phos 38 - 126 U/L - 65 62  AST 15 - 41 U/L - 18 21  ALT 14 - 54 U/L - 12(L) 13(L)       DIAGNOSTIC IMAGING:  I have discussed the results of the PET/CT scan dated 11/15/2017 which shows extensive hypermetabolic infiltrating tumor involving right nasopharynx, right soft palate, oropharynx, tongue base, hypopharynx and larynx.  There is hypermetabolic right neck adenopathy.  No other evidence of metastasis.    ASSESSMENT & PLAN:   Head and neck cancer ([place]) 1.  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  She is continuing to lose weight.  Her performance status is declining.  Dr. Quitman Livings indicated that she would not be a good candidate for radiation.  I do agree with that and I do not think she is a candidate for palliative chemotherapy.  She was positive for cocaine on urine testing multiple times.  She was negative for opioids, even though she was given a prescription for her right ear pain.  Hence I would not renew her pain medication.  We also talked at length about palliative care in the form of hospice.  Patient is agreeable to this option.  She does have mental capacity during our interview today.  We will put in a consultation for hospice.  We have also discussed the option of inpatient  hospice, as her friend who is accompanying her today told us that he may not be able to take  care of her in the future.  Currently she lives with her friend in his house.    This note includes documentation from Mike Craze, NP, who was present during this patient's office visit and evaluation.  I have reviewed this note for its completeness and accuracy.  I have edited this note accordingly based on my findings and medical opinion.       Derek Jack, MD [place] [phone]

## 2017-12-13 NOTE — Patient Instructions (Signed)
[facility] Discharge Instructions  Today you saw Dr. K.   Thank you for choosing  Cancer Center at [facility] to provide your oncology and hematology care.  To afford each patient quality time with our provider, please arrive at least 15 minutes before your scheduled appointment time.   If you have a lab appointment with the Cancer Center please come in thru the  Main Entrance and check in at the main information desk  You need to re-schedule your appointment should you arrive 10 or more minutes late.  We strive to give you quality time with our providers, and arriving late affects you and other patients whose appointments are after yours.  Also, if you no show three or more times for appointments you may be dismissed from the clinic at the providers discretion.     Again, thank you for choosing [facility].  Our hope is that these requests will decrease the amount of time that you wait before being seen by our physicians.       _____________________________________________________________  Should you have questions after your visit to [facility], please contact our office at [phone] between the hours of 8:30 a.m. and 4:30 p.m.  Voicemails left after 4:30 p.m. will not be returned until the following business day.  For prescription refill requests, have your pharmacy contact our office.       Resources For Cancer Patients and their Caregivers ? American Cancer Society: Can assist with transportation, wigs, general needs, runs Look Good Feel Better.        1-888-227-6333 ? Cancer Care: Provides financial assistance, online support groups, medication/co-pay assistance.  1-800-813-HOPE (4673) ? [facility] Assists [place] cancer patients and their families through emotional , educational and financial support.  336-427-4357 ? [place] DSS Where to apply for food  stamps, Medicaid and utility assistance. 336-342-1394 ? [facility]: Transportation to medical appointments. 336-347-2287 ? Social Security Administration: May apply for disability if have a Stage IV cancer. 336-342-7796 1-800-772-1213 ? [place] Aging, Disability and Transit Services: Assists with nutrition, care and transit needs. 336-349-2343  Cancer Center Support Programs:   > Cancer Support Group  2nd Tuesday of the month 1pm-2pm, Journey Room   > Creative Journey  3rd Tuesday of the month 1130am-1pm, Journey Room    

## 2017-12-13 NOTE — Progress Notes (Signed)
Nutrition Follow-up:  RD did not meet with patient following clinic visit this am.  Spoke with Elzie Rings, NP and plan is hospice referral at this time.    No further clinic visits have been made.  Home health will manage enteral nutrition.  Tevin Shillingford B. Zenia Resides, [place], [place] Registered Dietitian [phone] (pager)

## 2017-12-13 NOTE — Assessment & Plan Note (Signed)
1.  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  She is continuing to lose weight.  Her performance status is declining.  Dr. Quitman Livings indicated that she would not be a good candidate for radiation.  I do agree with that and I do not think she is a candidate for palliative chemotherapy.  She was positive for cocaine on urine testing multiple times.  She was negative for opioids, even though she was given a prescription for her right ear pain.  Hence I would not renew her pain medication.  We also talked at length about palliative care in the form of hospice.  Patient is agreeable to this option.  She does have mental capacity during our interview today.  We will put in a consultation for hospice.  We have also discussed the option of inpatient  hospice, as her friend who is accompanying her today told us that he may not be able to take care of her in the future.  Currently she lives with her friend in his house.

## 2017-12-16 ENCOUNTER — Inpatient Hospital Stay (HOSPITAL_COMMUNITY): Admission: RE | Admit: 2017-12-16 | Payer: Self-pay | Source: Ambulatory Visit

## 2017-12-18 ENCOUNTER — Encounter (HOSPITAL_COMMUNITY): Admission: RE | Payer: Self-pay | Source: Ambulatory Visit

## 2017-12-18 ENCOUNTER — Ambulatory Visit (HOSPITAL_COMMUNITY): Admission: RE | Admit: 2017-12-18 | Payer: Medicare Other | Source: Ambulatory Visit | Admitting: General Surgery

## 2017-12-18 SURGERY — INSERTION, TUNNELED CENTRAL VENOUS DEVICE, WITH PORT
Anesthesia: Monitor Anesthesia Care

## 2017-12-30 ENCOUNTER — Ambulatory Visit (HOSPITAL_COMMUNITY): Payer: Self-pay

## 2017-12-30 ENCOUNTER — Ambulatory Visit (HOSPITAL_COMMUNITY): Payer: Self-pay | Admitting: Hematology

## 2018-01-07 ENCOUNTER — Ambulatory Visit (HOSPITAL_COMMUNITY): Payer: Self-pay

## 2018-01-07 ENCOUNTER — Encounter (HOSPITAL_COMMUNITY): Payer: Self-pay | Admitting: Dietician

## 2018-01-14 ENCOUNTER — Ambulatory Visit (HOSPITAL_COMMUNITY): Payer: Self-pay

## 2018-01-14 ENCOUNTER — Encounter (HOSPITAL_COMMUNITY): Payer: Self-pay | Admitting: Dietician

## 2018-01-19 ENCOUNTER — Encounter (HOSPITAL_COMMUNITY): Payer: Self-pay | Admitting: Emergency Medicine

## 2018-01-19 ENCOUNTER — Other Ambulatory Visit: Payer: Self-pay

## 2018-01-19 ENCOUNTER — Emergency Department (HOSPITAL_COMMUNITY): Payer: Medicare Other

## 2018-01-19 ENCOUNTER — Inpatient Hospital Stay (HOSPITAL_COMMUNITY)
Admission: EM | Admit: 2018-01-19 | Discharge: 2018-02-10 | DRG: 640 | Disposition: E | Payer: Medicare Other | Attending: Internal Medicine | Admitting: Internal Medicine

## 2018-01-19 DIAGNOSIS — J9601 Acute respiratory failure with hypoxia: Secondary | ICD-10-CM | POA: Diagnosis not present

## 2018-01-19 DIAGNOSIS — J392 Other diseases of pharynx: Secondary | ICD-10-CM | POA: Diagnosis present

## 2018-01-19 DIAGNOSIS — Z23 Encounter for immunization: Secondary | ICD-10-CM | POA: Diagnosis not present

## 2018-01-19 DIAGNOSIS — C099 Malignant neoplasm of tonsil, unspecified: Secondary | ICD-10-CM | POA: Diagnosis present

## 2018-01-19 DIAGNOSIS — Z7189 Other specified counseling: Secondary | ICD-10-CM

## 2018-01-19 DIAGNOSIS — Z66 Do not resuscitate: Secondary | ICD-10-CM | POA: Diagnosis present

## 2018-01-19 DIAGNOSIS — E43 Unspecified severe protein-calorie malnutrition: Secondary | ICD-10-CM | POA: Diagnosis present

## 2018-01-19 DIAGNOSIS — F319 Bipolar disorder, unspecified: Secondary | ICD-10-CM | POA: Diagnosis present

## 2018-01-19 DIAGNOSIS — Z681 Body mass index (BMI) 19 or less, adult: Secondary | ICD-10-CM | POA: Diagnosis not present

## 2018-01-19 DIAGNOSIS — Z720 Tobacco use: Secondary | ICD-10-CM | POA: Diagnosis present

## 2018-01-19 DIAGNOSIS — Z8542 Personal history of malignant neoplasm of other parts of uterus: Secondary | ICD-10-CM

## 2018-01-19 DIAGNOSIS — E039 Hypothyroidism, unspecified: Secondary | ICD-10-CM | POA: Diagnosis present

## 2018-01-19 DIAGNOSIS — C140 Malignant neoplasm of pharynx, unspecified: Secondary | ICD-10-CM | POA: Diagnosis present

## 2018-01-19 DIAGNOSIS — E876 Hypokalemia: Secondary | ICD-10-CM | POA: Diagnosis present

## 2018-01-19 DIAGNOSIS — R627 Adult failure to thrive: Secondary | ICD-10-CM | POA: Diagnosis present

## 2018-01-19 DIAGNOSIS — F17210 Nicotine dependence, cigarettes, uncomplicated: Secondary | ICD-10-CM | POA: Diagnosis present

## 2018-01-19 DIAGNOSIS — F1410 Cocaine abuse, uncomplicated: Secondary | ICD-10-CM | POA: Diagnosis present

## 2018-01-19 DIAGNOSIS — Z515 Encounter for palliative care: Secondary | ICD-10-CM

## 2018-01-19 DIAGNOSIS — J441 Chronic obstructive pulmonary disease with (acute) exacerbation: Secondary | ICD-10-CM | POA: Diagnosis present

## 2018-01-19 DIAGNOSIS — K9423 Gastrostomy malfunction: Secondary | ICD-10-CM

## 2018-01-19 DIAGNOSIS — E860 Dehydration: Secondary | ICD-10-CM | POA: Diagnosis present

## 2018-01-19 DIAGNOSIS — Z931 Gastrostomy status: Secondary | ICD-10-CM

## 2018-01-19 DIAGNOSIS — Z9119 Patient's noncompliance with other medical treatment and regimen: Secondary | ICD-10-CM | POA: Diagnosis not present

## 2018-01-19 DIAGNOSIS — J9600 Acute respiratory failure, unspecified whether with hypoxia or hypercapnia: Secondary | ICD-10-CM

## 2018-01-19 DIAGNOSIS — G894 Chronic pain syndrome: Secondary | ICD-10-CM | POA: Diagnosis present

## 2018-01-19 DIAGNOSIS — E878 Other disorders of electrolyte and fluid balance, not elsewhere classified: Secondary | ICD-10-CM | POA: Diagnosis present

## 2018-01-19 DIAGNOSIS — F419 Anxiety disorder, unspecified: Secondary | ICD-10-CM | POA: Diagnosis present

## 2018-01-19 DIAGNOSIS — E871 Hypo-osmolality and hyponatremia: Secondary | ICD-10-CM | POA: Diagnosis present

## 2018-01-19 DIAGNOSIS — C760 Malignant neoplasm of head, face and neck: Secondary | ICD-10-CM | POA: Diagnosis present

## 2018-01-19 DIAGNOSIS — R6889 Other general symptoms and signs: Secondary | ICD-10-CM

## 2018-01-19 LAB — RAPID URINE DRUG SCREEN, HOSP PERFORMED
AMPHETAMINES: NOT DETECTED
BARBITURATES: NOT DETECTED
Benzodiazepines: NOT DETECTED
Cocaine: POSITIVE — AB
OPIATES: NOT DETECTED
TETRAHYDROCANNABINOL: NOT DETECTED

## 2018-01-19 LAB — CBC WITH DIFFERENTIAL/PLATELET
BASOS ABS: 0 10*3/uL (ref 0.0–0.1)
BASOS PCT: 0 %
EOS PCT: 0 %
Eosinophils Absolute: 0 10*3/uL (ref 0.0–0.7)
HCT: 44.1 % (ref 36.0–46.0)
Hemoglobin: 15.6 g/dL — ABNORMAL HIGH (ref 12.0–15.0)
Lymphocytes Relative: 7 %
Lymphs Abs: 1 10*3/uL (ref 0.7–4.0)
MCH: 33.6 pg (ref 26.0–34.0)
MCHC: 35.4 g/dL (ref 30.0–36.0)
MCV: 95 fL (ref 78.0–100.0)
MONO ABS: 0.8 10*3/uL (ref 0.1–1.0)
Monocytes Relative: 6 %
Neutro Abs: 12.4 10*3/uL — ABNORMAL HIGH (ref 1.7–7.7)
Neutrophils Relative %: 87 %
PLATELETS: 256 10*3/uL (ref 150–400)
RBC: 4.64 MIL/uL (ref 3.87–5.11)
RDW: 14.7 % (ref 11.5–15.5)
WBC: 14.2 10*3/uL — AB (ref 4.0–10.5)

## 2018-01-19 LAB — COMPREHENSIVE METABOLIC PANEL
ALBUMIN: 2.7 g/dL — AB (ref 3.5–5.0)
ALT: 25 U/L (ref 14–54)
AST: 48 U/L — AB (ref 15–41)
Alkaline Phosphatase: 118 U/L (ref 38–126)
BUN: 6 mg/dL (ref 6–20)
CO2: 47 mmol/L — AB (ref 22–32)
CREATININE: 0.38 mg/dL — AB (ref 0.44–1.00)
Calcium: 8.4 mg/dL — ABNORMAL LOW (ref 8.9–10.3)
GFR calc Af Amer: >60 mL/min (ref >60)
GLUCOSE: 104 mg/dL — AB (ref 65–99)
Potassium: <2 mmol/L — CL (ref 3.5–5.1)
SODIUM: 120 mmol/L — AB (ref 135–145)
Total Bilirubin: 1.2 mg/dL (ref 0.3–1.2)
Total Protein: 6.6 g/dL (ref 6.5–8.1)

## 2018-01-19 LAB — MAGNESIUM: Magnesium: 1.4 mg/dL — ABNORMAL LOW (ref 1.7–2.4)

## 2018-01-19 LAB — ETHANOL: Alcohol, Ethyl (B): <10 mg/dL (ref <10)

## 2018-01-19 MED ORDER — IOPAMIDOL (ISOVUE-300) INJECTION 61%
INTRAVENOUS | Status: AC
  Administered 2018-01-19: 50 mL via GASTROSTOMY
  Filled 2018-01-19: qty 50

## 2018-01-19 MED ORDER — ORAL CARE MOUTH RINSE: 15.0000 mL | Freq: Two times a day (BID) | OROMUCOSAL | Status: DC

## 2018-01-19 MED ORDER — POTASSIUM CHLORIDE 10 MEQ/100ML IV SOLN
10.0000 meq | Freq: Once | INTRAVENOUS | Status: AC
  Administered 2018-01-19: 10 meq via INTRAVENOUS
  Filled 2018-01-19: qty 100

## 2018-01-19 MED ORDER — OXYCODONE-ACETAMINOPHEN 5-325 MG PO TABS: 1.0000 | ORAL_TABLET | Freq: Four times a day (QID) | ORAL | Status: DC | PRN

## 2018-01-19 MED ORDER — OSMOLITE 1.5 CAL PO LIQD
237.0000 mL | ORAL | Status: DC
  Filled 2018-01-19 (×9): qty 237

## 2018-01-19 MED ORDER — MAGNESIUM SULFATE 2 GM/50ML IV SOLN
2.0000 g | INTRAVENOUS | Status: AC
Start: 2018-01-19 — End: 2018-01-19
  Administered 2018-01-19: 2 g via INTRAVENOUS
  Filled 2018-01-19: qty 50

## 2018-01-19 MED ORDER — SODIUM CHLORIDE 0.9 % IV BOLUS
1000.0000 mL | Freq: Once | INTRAVENOUS | Status: AC
  Administered 2018-01-19: 1000 mL via INTRAVENOUS

## 2018-01-19 MED ORDER — ONDANSETRON HCL 4 MG PO TABS: 4.0000 mg | ORAL_TABLET | Freq: Four times a day (QID) | ORAL | Status: DC | PRN

## 2018-01-19 MED ORDER — PNEUMOCOCCAL VAC POLYVALENT 25 MCG/0.5ML IJ INJ
0.5000 mL | INJECTION | INTRAMUSCULAR | Status: AC
  Administered 2018-01-20: 0.5 mL via INTRAMUSCULAR
  Filled 2018-01-19: qty 0.5

## 2018-01-19 MED ORDER — ENOXAPARIN SODIUM 30 MG/0.3ML ~~LOC~~ SOLN
30.0000 mg | SUBCUTANEOUS | Status: DC
  Administered 2018-01-19: 30 mg via SUBCUTANEOUS
  Filled 2018-01-19: qty 0.3

## 2018-01-19 MED ORDER — CHLORHEXIDINE GLUCONATE 0.12 % MT SOLN
15.0000 mL | Freq: Two times a day (BID) | OROMUCOSAL | Status: DC
  Administered 2018-01-20: 15 mL via OROMUCOSAL
  Filled 2018-01-19: qty 15

## 2018-01-19 MED ORDER — POTASSIUM CHLORIDE 10 MEQ/100ML IV SOLN
10.0000 meq | INTRAVENOUS | Status: AC
  Administered 2018-01-19 (×4): 10 meq via INTRAVENOUS
  Filled 2018-01-19 (×3): qty 100

## 2018-01-19 MED ORDER — NICOTINE 21 MG/24HR TD PT24
21.0000 mg | MEDICATED_PATCH | Freq: Every day | TRANSDERMAL | Status: DC
  Administered 2018-01-19 – 2018-01-20 (×2): 21 mg via TRANSDERMAL
  Filled 2018-01-19 (×2): qty 1

## 2018-01-19 MED ORDER — LORAZEPAM 0.5 MG PO TABS
0.5000 mg | ORAL_TABLET | Freq: Every day | ORAL | Status: DC | PRN
  Filled 2018-01-19: qty 1

## 2018-01-19 MED ORDER — LORAZEPAM 2 MG/ML IJ SOLN
0.5000 mg | Freq: Every day | INTRAMUSCULAR | Status: DC | PRN
  Administered 2018-01-19: 0.5 mg via INTRAVENOUS
  Filled 2018-01-19: qty 1

## 2018-01-19 MED ORDER — ONDANSETRON HCL 4 MG/2ML IJ SOLN: 4.0000 mg | Freq: Four times a day (QID) | INTRAMUSCULAR | Status: DC | PRN

## 2018-01-19 MED ORDER — SODIUM CHLORIDE 0.9 % IV SOLN
INTRAVENOUS | Status: DC
  Administered 2018-01-19: 18:00:00 via INTRAVENOUS

## 2018-01-19 NOTE — ED Notes (Signed)
CRITICAL VALUE ALERT  Critical Value:  Potassium <2.0 and Chloride<65  Date & Time Notied: 01/15/2018 @ 1453  Provider Notified: Dr Sabra Heck   Orders Received/Actions taken: verbal order for EKG, other orders to be placed by EDP

## 2018-01-19 NOTE — ED Triage Notes (Signed)
Pt brought in with sheriff deputy under IVC. Pt IVC'd by caretaker for purposefully pulling out feeding tube and refusing EMS transport to hospital. Pt reports she did it to clean it. Pt has throat cancer. Denies SI/HI. Pt admits to drinking one margarita and smoking crack PTA.

## 2018-01-19 NOTE — ED Notes (Signed)
Faxed IVC paperwork to [facility] 

## 2018-01-19 NOTE — ED Provider Notes (Signed)
[facility] EMERGENCY DEPARTMENT Provider Note   CSN: [id number] Arrival date & time: 01/30/2018  1256     History   Chief Complaint Chief Complaint  Patient presents with  . Medical Clearance    HPI Cynthia Church is a 52 y.o. female.  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  She currently has a feeding tube that was placed in her abdomen, this was done back in March, she refuses to have any further treatment of her cancer in her throat.  She has been able to breathe and speak but takes all of her feeds by the tube.  She presents today with involuntarily commitment papers which were taken out by the person that she was living with a Mr. Cynthia Church.  This is the person in whose house she lives as well as with her ex-boyfriend who also lives there.  When asked why the patient is here she states it is because the people who live with her are liars.  She refuses that she purposefully pulled out her G-tube but states that she accidentally pulled it out while she was trying to clean it.  When I talked to Mr. Cynthia Church he states that she had in fact given herself a feed this morning through the tube successfully.  It is difficult to tell what the exact cause of her commitment was after discussion with Mr. Cynthia Church until in the end he states that he cannot handle her anymore with her difficult personality and the trouble that she is causing though he has a hard time telling me what that is.  Evidently the patient uses drugs, she has been more difficult to live with the clarity of these details are not exact.  At the end of the conversation Mr. Cynthia Church essentially says to me that she cannot go back to live with them anymore she will have to go  somewhere else.  She has been in and out of psychiatric hospitals across the state over time.  Past Medical History:  Diagnosis Date  . Alcoholism ([place])   . Bipolar affective disorder, manic ([place]) 1993   Dx'd at Cynthia Church  . Cancer ([place]) 1989   uterine  . Chronic pain   . COPD (chronic obstructive pulmonary disease) ([place])   . Headache(784.0) 09/25/2011  . Hyperthyroidism 1993  . Low back pain   . Mental disorder   . Personality disorders 1993    Patient Active Problem List   Diagnosis Date Noted  . Head and neck cancer (Cynthia Church) 11/20/2017  . Malnutrition (Cynthia Church)   . Increased oropharyngeal secretions   . Palliative care by specialist   . 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 Protein-calorie malnutrition, severe 10/29/2017  . Hypokalemia 10/08/2017  . Polycythemia 10/08/2017  . MDD (major depressive disorder) 07/07/2016  . COPD without exacerbation (Cynthia Church) 06/05/2016  . Tobacco abuse 06/05/2016  . Bipolar disorder (Cynthia Church) 06/05/2016  . Personality disorder in adult [facility]) 06/05/2016  . Abdominal aortic atherosclerosis ([place]) 06/05/2016  . Hypothyroidism 06/05/2016  . Alcohol dependence (Cynthia Church) 09/29/2011    Past Surgical History:  Procedure Laterality Date  . BACK SURGERY    . BREAST SURGERY  2003   to check for possible cancer  .  cauterization of uterine cancer  2008  . GASTROSTOMY N/A 10/29/2017   Procedure: INSERTION OF GASTROSTOMY TUBE;  Surgeon: Cynthia Skeans, MD;  Location: [place];  Service: General;  Laterality: N/A;  . IR FLUORO RM 30-60 MIN  10/28/2017  . tubal ligaation    . TUBAL LIGATION       OB History    Gravida      Para      Term      Preterm      AB      Living  0     SAB      TAB      Ectopic      Multiple      Live Births               Home Medications    Prior to Admission medications   Medication Sig Start Date End Date Taking? Authorizing Provider  lidocaine-prilocaine (EMLA) cream Apply to affected area once 11/28/17    Cynthia Jack, MD  LORazepam (ATIVAN) 0.5 MG tablet Take 1 tablet by mouth daily as needed. 12/16/17   [provider]  nicotine (NICODERM CQ - DOSED IN MG/24 HOURS) 21 mg/24hr patch Place 1 patch (21 mg total) onto the skin daily. 11/20/17   Cynthia Jack, MD  Nutritional Supplements (FEEDING SUPPLEMENT, OSMOLITE 1.5 CAL,) LIQD Give 1 carton 5 times per day (8am, 11 am, 2pm, 5pm and 8pm) in feeding tube.   Flush with 45ml of water before and after each feeding. 11/22/17   Cynthia Jack, MD  ondansetron (ZOFRAN) 8 MG tablet Take 1 tablet (8 mg total) by mouth 2 (two) times daily as needed. Start on the third day after chemotherapy. 11/28/17   Cynthia Jack, MD  oxyCODONE-acetaminophen (PERCOCET/ROXICET) 5-325 MG tablet Place 1 tablet into feeding tube every 6 (six) hours as needed. 11/20/17   Cynthia Jack, MD  prochlorperazine (COMPAZINE) 10 MG tablet Take 1 tablet (10 mg total) by mouth every 6 (six) hours as needed (Nausea or vomiting). 11/28/17   Cynthia Jack, MD    Family History Family History  Problem Relation Age of Onset  . Cirrhosis Father     Social History Social History   Tobacco Use  . Smoking status: Current Every Day Smoker    Packs/day: 3.00    Years: 28.00    Pack years: 84.00    Types: Cigarettes  . Smokeless tobacco: Never Used  Substance Use Topics  . Alcohol use: Yes    Comment: daily  . Drug use: Yes    Types: Marijuana, Cocaine    Comment: yesterday     Allergies   Patient has no known allergies.   Review of Systems Review of Systems  All other systems reviewed and are negative.    Physical Exam Updated Vital Signs BP (!) 143/88   Pulse 89   Temp (!) 97.4 F (36.3 C) (Axillary)   Resp 16   Wt 33.1 kg (73 lb)   SpO2 96%   BMI 12.53 kg/m   Physical Exam  Constitutional: She appears well-developed and well-nourished. No distress.  HENT:  Head: Normocephalic and atraumatic.  Posterior pharynx  with some swelling, there is some white exudative buildup along the edge of the posterior soft palate more on the right than the left, phonation is hoarse but she has no difficulty controlling her secretions or breathing  Eyes: Pupils are equal, round, and reactive to light. Conjunctivae and EOM are normal. Right eye exhibits no  discharge. Left eye exhibits no discharge. No scleral icterus.  Neck: Normal range of motion. Neck supple. No JVD present. No thyromegaly present.  There is a fullness to the neck in the submandibular area but she has good range of motion of the neck without torticollis or trismus  Cardiovascular: Normal rate, regular rhythm, normal heart sounds and intact distal pulses. Exam reveals no gallop and no friction rub.  No murmur heard. Pulmonary/Chest: Effort normal and breath sounds normal. No respiratory distress. She has no wheezes. She has no rales.  Abdominal: Soft. Bowel sounds are normal. She exhibits no distension and no mass. There is no tenderness.  Flat abdomen, very thin, G-tube has been pulled out, site appears patent, fresh surgical scar well-healing with staples in place  Musculoskeletal: Normal range of motion. She exhibits no edema or tenderness.  Muscle wasting, significantly cachectic appearing  Lymphadenopathy:    She has no cervical adenopathy.  Neurological: She is alert. Coordination normal.  Skin: Skin is warm and dry. No rash noted. No erythema.  Psychiatric:  The patient is agitated, she is very clearly tell me what happened this morning, she states that she did not purposely pull out her tube, she is not hallucinating, she is not suicidal but states that she does not care if she lives anymore.  Nursing note and vitals reviewed.    ED Treatments / Results  Labs (all labs ordered are listed, but only abnormal results are displayed) Labs Reviewed  CBC WITH DIFFERENTIAL/PLATELET - Abnormal; Notable for the following components:      Result Value     WBC 14.2 (*)    Hemoglobin 15.6 (*)    Neutro Abs 12.4 (*)    All other components within normal limits  COMPREHENSIVE METABOLIC PANEL - Abnormal; Notable for the following components:   Sodium 120 (*)    Potassium <2.0 (*)    Chloride <65 (*)    CO2 47 (*)    Glucose, Bld 104 (*)    Creatinine, Ser 0.38 (*)    Calcium 8.4 (*)    Albumin 2.7 (*)    AST 48 (*)    All other components within normal limits  RAPID URINE DRUG SCREEN, HOSP PERFORMED - Abnormal; Notable for the following components:   Cocaine POSITIVE (*)    All other components within normal limits  ETHANOL    EKG EKG Interpretation  Date/Time:  Sunday January 19 2018 15:10:33 EDT Ventricular Rate:  90 PR Interval:    QRS Duration: 77 QT Interval:  403 QTC Calculation: 494 R Axis:   81 Text Interpretation:  Sinus rhythm Right atrial enlargement Repol abnrm suggests ischemia, diffuse leads Baseline wander in lead(s) V3 V6 t waves diffusely abnormally inverted Abnormal ekg Confirmed by Noemi Chapel [phone]) on 01/11/2018 3:33:09 PM   Radiology Dg Abdomen Peg Tube Location  Result Date: 02/05/2018 CLINICAL DATA:  New PEG tube placement. EXAM: ABDOMEN - 1 VIEW 50 cc Isovue-300 used to flush PEG tube. COMPARISON:  None. FINDINGS: Contrast extension through the PEG to demonstrate contrast in the stomach. IMPRESSION: Peg tube in the stomach as described. Electronically Signed   By: Abelardo Diesel M.D.   On: 02/09/2018 14:39    Procedures .Critical Care Performed by: Noemi Chapel, MD Authorized by: Noemi Chapel, MD   Critical care provider statement:    Critical care time (minutes):  35   Critical care time was exclusive of:  Separately billable procedures and treating other patients and teaching time  Critical care was necessary to treat or prevent imminent or life-threatening deterioration of the following conditions:  Endocrine crisis   Critical care was time spent personally by me on the following activities:   Blood draw for specimens, development of treatment plan with patient or surrogate, discussions with consultants, evaluation of patient's response to treatment, examination of patient, obtaining history from patient or surrogate, ordering and performing treatments and interventions, ordering and review of laboratory studies, ordering and review of radiographic studies, pulse oximetry, re-evaluation of patient's condition and review of old charts Comments:       Gastrostomy tube replacement Date/Time: 01/15/2018 3:33 PM Performed by: Noemi Chapel, MD Authorized by: Noemi Chapel, MD  Consent: Verbal consent obtained. Risks and benefits: risks, benefits and alternatives were discussed Consent given by: patient Patient understanding: patient states understanding of the procedure being performed Patient consent: the patient's understanding of the procedure matches consent given Procedure consent: procedure consent matches procedure scheduled Relevant documents: relevant documents present and verified Test results: test results available and properly labeled Site marked: the operative site was marked Imaging studies: imaging studies available Patient identity confirmed: verbally with patient Time out: Immediately prior to procedure a "time out" was called to verify the correct patient, procedure, equipment, support staff and site/side marked as required. Preparation: Patient was prepped and draped in the usual sterile fashion. Local anesthesia used: no  Anesthesia: Local anesthesia used: no  Sedation: Patient sedated: no  Patient tolerance: Patient tolerated the procedure well with no immediate complications Comments: Direct placement of tube without difficulty Xray confirmed placement of tube     (including critical care time)  Medications Ordered in ED Medications  magnesium sulfate IVPB 2 g 50 mL (2 g Intravenous New Bag/Given 01/28/2018 1529)  potassium chloride 10 mEq in 100 mL IVPB  (10 mEq Intravenous New Bag/Given 02/03/2018 1527)  sodium chloride 0.9 % bolus 1,000 mL (1,000 mLs Intravenous New Bag/Given 02/05/2018 1526)  iopamidol (ISOVUE-300) 61 % injection (50 mLs Feeding Tube Contrast Given 01/13/2018 1420)     Initial Impression / Assessment and Plan / ED Course  I have reviewed the triage vital signs and the nursing notes.  Pertinent labs & imaging results that were available during my care of the patient were reviewed by me and considered in my medical decision making (see chart for details).  Clinical Course as of Jan 19 1534  Sun Jan 19, 2018  1456 Abs are significant for severe hypokalemia and hypochloremia with hyponatremia.  The patient has an elevated white blood cell count at 14,200, she is not anemic, she does have poor liver function with an albumin of 2.7.   [BM]  1457 At this time the patient will need an EKG to look for prolonged QT, she will also need to have replacement of these electrolytes that she is severely depleted and is critically ill and could go into an arrhythmia.  Cardiac monitoring, admit to the medical service to a high level of care   [BM]    Clinical Course User Index [BM] Noemi Chapel, MD   The patient is under commitment, at this time we will discuss with psychiatry, she may need to be placed either medically or psychiatrically but I do not get the feeling that she is a danger to herself as far as intentionally hurting herself.  She has refused ongoing cancer treatment, I feel that she has the right to refuse that treatment.  She knows that this will eventually kill her.  That  being said when I asked her if she would want to be placed on life support if she were to have trouble breathing she says yes.  When asked about CPR she says yes.  These are not the thoughts that I would expect for someone who wants to die today.  Patient has significant and severe hypokalemia, there is no evidence of prolonged QT but she does have some abnormal T  waves.  She also has severe hypochloremia and hyponatremia for which she will need to be admitted to the hospital.  I discussed her care with Dr. Darrick Meigs who will admit  Medicines given include 2 g of magnesium sulfate, potassium supplementation and normal saline.  The patient is critically ill and will need cardiac monitoring and a high level of care tonight  Final Clinical Impressions(s) / ED Diagnoses   Final diagnoses:  Hypokalemia  Hyponatremia  Hypochloremia  Gastrostomy tube dysfunction (HCC)     Noemi Chapel, MD 01/28/2018 1535

## 2018-01-19 NOTE — BH Assessment (Signed)
Tele Assessment Note   Patient Name: Cynthia Church MRN: [id number] Referring Physician: Dr. Noemi Chapel, MD Location of Patient: [facility] ED Location of Provider: [place] is a 52 y.o. female who was brought to [facility] by [facility] via [facility]C due to one of her roommates filing the paperwork after pt pulled out her feeding tube. Pt states she pulled her tube out to clean it and that her roommate called "because he's stupid." Pt shared she smokes crack ($20 worth) and drinks alcohol (1 beer) daily. Pt denies SI, HI, NSSIB, and AVH.  Pt denies any current services, including therapy or psychiatry. She shares she previously had a psychiatrist, though she doesn't remember when. She states she has previously been hospitalized for mental health, though she doesn't remember when or how many times. Pt denies any pending legal charges, any upcoming court dates, or that she is on probation. Pt shares she is retired and that she has cancer (thus the feeding tube). She states she is no longer treating the cancer and that she has told her doctor not to do anything else for her.  Pt denies any weapon in the homes, including guns or knives. She shares she has two female roommates and that her roommate, Eddie Dibbles, is a good support, even though he is the one who "took the papers out" on her. She permitted clinician to call her roommate Elenore Rota to verify her ability to remain safe if she were to return home. Elenore Rota verified that pt is not suicidal, though she becomes upset when she is sick with her cancer and wishes she would die. Verified that pt has never stated she plans/wants to kill herself or notes a plan and Elenore Rota verified 3x that this was accurate.   Pt was oriented x3--she stated she does not know what today's date is. Pt's remote memory is impaired and her recent memory is intact. Pt was cooperative throughout the assessment, though she questioned multiple times when she was  going to be able to go home. Pt's insight, judgement, and impulse control is impaired at this time.   Diagnosis: F14.20, Cocaine use disorder, Moderate  Past Medical History:  Past Medical History:  Diagnosis Date  . Alcoholism ([place])   . Bipolar affective disorder, manic ([place]) 1993   [facility]'d at [facility]  . Cancer ([place]) 1989   uterine  . Chronic pain   . COPD (chronic obstructive pulmonary disease) ([place])   . Headache(784.0) 09/25/2011  . Hyperthyroidism 1993  . Low back pain   . Mental disorder   . Personality disorders 1993    Past Surgical History:  Procedure Laterality Date  . BACK SURGERY    . BREAST SURGERY  2003   to check for possible cancer  . cauterization of uterine cancer  2008  . GASTROSTOMY N/A 10/29/2017   Procedure: INSERTION OF GASTROSTOMY TUBE;  Surgeon: Georganna Skeans, MD;  Location: [place];  Service: General;  Laterality: N/A;  . IR FLUORO RM 30-60 MIN  10/28/2017  . tubal ligaation    . TUBAL LIGATION      Family History:  Family History  Problem Relation Age of Onset  . Cirrhosis Father     Social History:  reports that she has been smoking cigarettes.  She has a 84.00 pack-year smoking history. She has never used smokeless tobacco. She reports that she drinks alcohol. She reports that she has current or past drug history. Drugs: Marijuana and Cocaine.  Additional Social History:  Alcohol / Drug Use Pain Medications: Please see MAR Prescriptions: Please see MAR Over the Counter: Please see MAR History of alcohol / drug use?: Yes Longest period of sobriety (when/how long): Unknown Substance #1 Name of Substance 1: Crack 1 - Age of First Use: 19 1 - Amount (size/oz): $20 1 - Frequency: Daily 1 - Duration: Unknown 1 - Last Use / Amount: Today (01/19/18) Substance #2 Name of Substance 2: EoTH 2 - Age of First Use: 11 2 - Amount (size/oz): 1 beer 2 - Frequency: Daily 2 - Duration: Unknown 2 - Last Use / Amount: Today (01/19/18)  CIWA:  CIWA-Ar BP: (!) 150/97 Pulse Rate: 87 COWS:    Allergies: No Known Allergies  Home Medications:  (Not in a hospital admission)  OB/GYN Status:  No LMP recorded. Patient is postmenopausal.  General Assessment Data Location of Assessment: [facility] TTS Assessment: In system Is this a Tele or Face-to-Face Assessment?: Tele Assessment Is this an Initial Assessment or a Re-assessment for this encounter?: Initial Assessment Marital status: Divorced [place] name: Malva Cogan Is patient pregnant?: No Pregnancy Status: No Living Arrangements: Non-relatives/Friends Can pt return to current living arrangement?: (Unknown) Admission Status: Involuntary Is patient capable of signing voluntary admission?: Yes Referral Source: MD Insurance type: Medicare  Medical Screening Exam ([place]) Medical Exam completed: Yes  Crisis Care Plan Living Arrangements: Non-relatives/Friends Legal Guardian: Other:(N/A) Name of Psychiatrist: N/A Name of Therapist: N/A  Education Status Is patient currently in school?: No Is the patient employed, unemployed or receiving disability?: Receiving disability income  Risk to self with the past 6 months Suicidal Ideation: No Has patient been a risk to self within the past 6 months prior to admission? : No Suicidal Intent: No Has patient had any suicidal intent within the past 6 months prior to admission? : No Is patient at risk for suicide?: No Suicidal Plan?: No Has patient had any suicidal plan within the past 6 months prior to admission? : No Access to Means: No What has been your use of drugs/alcohol within the last 12 months?: Pt shares she uses EoTH and crack daily Previous Attempts/Gestures: No How many times?: 0 Other Self Harm Risks: Pt is refusing treatment for her cancer Triggers for Past Attempts: None known Intentional Self Injurious Behavior: None Family Suicide History: No Recent stressful life event(s): Other (Comment)(Pt has cancer;  roommate states it's all over her body) Persecutory voices/beliefs?: No Depression: No Depression Symptoms: Isolating, Fatigue, Feeling angry/irritable Substance abuse history and/or treatment for substance abuse?: Yes Suicide prevention information given to non-admitted patients: Not applicable  Risk to Others within the past 6 months Homicidal Ideation: No Does patient have any lifetime risk of violence toward others beyond the six months prior to admission? : No Thoughts of Harm to Others: No Current Homicidal Intent: No Current Homicidal Plan: No Access to Homicidal Means: No Identified Victim: N/A History of harm to others?: No Assessment of Violence: On admission Violent Behavior Description: None noted Does patient have access to weapons?: No(Pt denied access to guns or knives) Criminal Charges Pending?: No Does patient have a court date: No Is patient on probation?: No  Psychosis Hallucinations: None noted Delusions: None noted  Mental Status Report Appearance/Hygiene: Unremarkable Eye Contact: Good Motor Activity: Psychomotor retardation, Other (Comment)(Pt was lying in bed) Speech: Soft, Slow Level of Consciousness: Quiet/awake Mood: Irritable Affect: Irritable Anxiety Level: None Thought Processes: Circumstantial, Coherent Judgement: Partial Orientation: Person, Place, Situation(Pt did not know today's date)  Obsessive Compulsive Thoughts/Behaviors: None  Cognitive Functioning Concentration: Normal Memory: Recent Intact, Remote Intact Is patient IDD: No Is patient DD?: No Insight: Fair Impulse Control: Poor Appetite: Good Have you had any weight changes? : No Change Sleep: No Change Total Hours of Sleep: 7 Vegetative Symptoms: Staying in bed  ADLScreening [facility] Assessment Services) Patient's cognitive ability adequate to safely complete daily activities?: Yes Patient able to express need for assistance with ADLs?: Yes Independently performs ADLs?: Yes  (appropriate for developmental age)  Prior Inpatient Therapy Prior Inpatient Therapy: Yes Prior Therapy Dates: Pt could not remember Prior Therapy Facilty/Provider(s): Pt could not remember Reason for Treatment: Pt could not remember  Prior Outpatient Therapy Prior Outpatient Therapy: No Does patient have an ACCT team?: No Does patient have Intensive In-House Services?  : No Does patient have [facility] services? : No Does patient have P4CC services?: No  ADL Screening (condition at time of admission) Patient's cognitive ability adequate to safely complete daily activities?: Yes Is the patient deaf or have difficulty hearing?: No Does the patient have difficulty seeing, even when wearing glasses/contacts?: No Does the patient have difficulty concentrating, remembering, or making decisions?: No Patient able to express need for assistance with ADLs?: Yes Does the patient have difficulty dressing or bathing?: No Independently performs ADLs?: Yes (appropriate for developmental age) Does the patient have difficulty walking or climbing stairs?: No       Abuse/Neglect Assessment (Assessment to be complete while patient is alone) Abuse/Neglect Assessment Can Be Completed: Yes Physical Abuse: Denies Verbal Abuse: Denies Sexual Abuse: Denies Exploitation of patient/patient's resources: Denies Self-Neglect: Denies Values / Beliefs Cultural Requests During Hospitalization: None Spiritual Requests During Hospitalization: None Consults Spiritual Care Consult Needed: No Social Work Consult Needed: No Regulatory affairs officer (For Healthcare) Does Patient Have a Medical Advance Directive?: No Would patient like information on creating a medical advance directive?: No - Patient declined       Disposition: Gust Rung NP reviewed pt's chart and information and determined that pt does not meet inpatient hospitalization criteria. This information was provided to pt's nurse, Cruzita Lederer, at  [phone].  Disposition Initial Assessment Completed for this Encounter: Yes Patient referred to: Other (Comment)(Pt is psych cleared and can be d/c after med cleared)  This service was provided via telemedicine using a 2-way, interactive audio and video technology.  Names of all persons participating in this telemedicine service and their role in this encounter. Name: Wynonia Sours Role: Patient  Name: Elenore Rota Role: Roommate    Dannielle Burn 01/14/2018 2:30 PM

## 2018-01-19 NOTE — H&P (Signed)
TRH H&P    Cynthia Church Demographics:    Cynthia Church, is a 52 y.o. female  MRN: [id number]  DOB - 05-19-1966  Admit Date - 01/30/2018  Referring MD/NP/PA: Noemi Chapel  Outpatient Primary MD for the Cynthia Church is Cynthia Church, No Pcp Per  Cynthia Church coming from: Home  Chief complaint-G-tube fell out   HPI:    Cynthia Church  is a 52 y.o. female With history of bipolar disorder, tobacco abuse, COPD, head and neck cancer for which Cynthia Church refused surgical or chemotherapy, hypothyroidism, drug use including crack cocaine, uterine cancer, chronic pain syndrome has G-tube in place, came to the hospital for G-tube fell out at home.  As per family members Cynthia Church refused to take nutrition through G-tube and pulled it out.  Cynthia Church  denies pulling out the G-tube and says it fell out.  Cynthia Church was initially IVCfor possible suicidal ideation, lab work revealed severe hypokalemia and hyponatremia.  So Cynthia Church will be admitted for medical optimization before psychiatric evaluation. Cynthia Church denies any symptoms, no nausea vomiting  She does complain of diarrhea. Denies abdominal pain.  No chest pain or shortness of breath. Denies dysuria urgency or frequency of urination.     Review of systems:     All other systems reviewed and are negative.   With Past History of the following :    Past Medical History:  Diagnosis Date  . Alcoholism ([place])   . Bipolar affective disorder, manic ([place]) 1993   [facility]'d [facility]  . Cancer ([place]) 1989   uterine  . Chronic pain   . COPD (chronic obstructive pulmonary disease) ([place])   . Headache(784.0) 09/25/2011  . Hyperthyroidism 1993  . Low back pain   . Mental disorder   . Personality disorders 1993      Past Surgical History:  Procedure Laterality Date  . BACK SURGERY    . BREAST SURGERY  2003   to check for possible cancer  . cauterization of uterine cancer  2008  . GASTROSTOMY N/A  10/29/2017   Procedure: INSERTION OF GASTROSTOMY TUBE;  Surgeon: Georganna Skeans, MD;  Location: [place];  Service: General;  Laterality: N/A;  . IR FLUORO RM 30-60 MIN  10/28/2017  . tubal ligaation    . TUBAL LIGATION        Social History:      Social History   Tobacco Use  . Smoking status: Current Every Day Smoker    Packs/day: 3.00    Years: 28.00    Pack years: 84.00    Types: Cigarettes  . Smokeless tobacco: Never Used  Substance Use Topics  . Alcohol use: Yes    Comment: daily       Family History :     Family History  Problem Relation Age of Onset  . Cirrhosis Father       Home Medications:   Prior to Admission medications   Medication Sig Start Date End Date Taking? Authorizing Provider  lidocaine-prilocaine (EMLA) cream Apply to affected area once 11/28/17   Derek Jack, MD  LORazepam (  ATIVAN) 0.5 MG tablet Take 1 tablet by mouth daily as needed. 12/16/17   [provider]  nicotine (NICODERM CQ - DOSED IN MG/24 HOURS) 21 mg/24hr patch Place 1 patch (21 mg total) onto the skin daily. 11/20/17   Derek Jack, MD  Nutritional Supplements (FEEDING SUPPLEMENT, OSMOLITE 1.5 CAL,) LIQD Give 1 carton 5 times per day (8am, 11 am, 2pm, 5pm and 8pm) in feeding tube.   Flush with 81ml of water before and after each feeding. 11/22/17   Derek Jack, MD  ondansetron (ZOFRAN) 8 MG tablet Take 1 tablet (8 mg total) by mouth 2 (two) times daily as needed. Start on the third day after chemotherapy. 11/28/17   Derek Jack, MD  oxyCODONE-acetaminophen (PERCOCET/ROXICET) 5-325 MG tablet Place 1 tablet into feeding tube every 6 (six) hours as needed. 11/20/17   Derek Jack, MD  prochlorperazine (COMPAZINE) 10 MG tablet Take 1 tablet (10 mg total) by mouth every 6 (six) hours as needed (Nausea or vomiting). 11/28/17   Derek Jack, MD     Allergies:    No Known Allergies   Physical Exam:   Vitals  Blood pressure (!)  141/97, pulse 87, temperature (!) 97.4 F (36.3 C), temperature source Axillary, resp. rate 18, weight 33.1 kg (73 lb), SpO2 100 %.  1.  General:  cachectic appearing female in no acute distress  2. Psychiatric:  Intact judgement and  insight, awake alert, oriented x 3.  3. Neurologic: No focal neurological deficits, all cranial nerves intact.Strength 5/5 all 4 extremities, sensation intact all 4 extremities, plantars down going.  4. Eyes :  anicteric sclerae, moist conjunctivae with no lid lag. PERRLA.  5. ENMT:  Oropharynx clear with moist mucous membranes and good dentition  6. Neck:  supple, no cervical lymphadenopathy appriciated, No thyromegaly  7. Respiratory : Normal respiratory effort, good air movement bilaterally,clear to  auscultation bilaterally  8. Cardiovascular : RRR, no gallops, rubs or murmurs, no leg edema  9. Gastrointestinal:  Positive bowel sounds, abdomen soft, PEG tube in place. ,no hepatosplenomegaly, no rigidity or guarding ,     10. Skin:  No cyanosis, normal texture and turgor, no rash, lesions or ulcers  11.Musculoskeletal:  Good muscle tone,  joints appear normal , no effusions,  normal range of motion    Data Review:    CBC Recent Labs  Lab 02/05/2018 1412  WBC 14.2*  HGB 15.6*  HCT 44.1  PLT 256  MCV 95.0  MCH 33.6  MCHC 35.4  RDW 14.7  LYMPHSABS 1.0  MONOABS 0.8  EOSABS 0.0  BASOSABS 0.0   ------------------------------------------------------------------------------------------------------------------  Chemistries  Recent Labs  Lab 01/30/2018 1412  NA 120*  K <2.0*  CL <65*  CO2 47*  GLUCOSE 104*  BUN 6  CREATININE 0.38*  CALCIUM 8.4*  AST 48*  ALT 25  ALKPHOS 118  BILITOT 1.2    ------------------------------------------------------------------------------------------------------------------  ------------------------------------------------------------------------------------------------------------------ GFR: Estimated Creatinine Clearance: 44 mL/min (A) (by C-G formula based on SCr of 0.38 mg/dL (L)). Liver Function Tests: Recent Labs  Lab 02/03/2018 1412  AST 48*  ALT 25  ALKPHOS 118  BILITOT 1.2  PROT 6.6  ALBUMIN 2.7*    --------------------------------------------------------------------------------------------------------------- Urine analysis:    Component Value Date/Time   COLORURINE YELLOW 10/26/2017 0151   APPEARANCEUR HAZY (A) 10/26/2017 0151   LABSPEC >1.046 (H) 10/26/2017 0151   PHURINE 7.0 10/26/2017 0151   GLUCOSEU NEGATIVE 10/26/2017 0151   HGBUR SMALL (A) 10/26/2017 0151   BILIRUBINUR NEGATIVE 10/26/2017 0151  KETONESUR 5 (A) 10/26/2017 0151   PROTEINUR NEGATIVE 10/26/2017 0151   UROBILINOGEN 0.2 06/01/2015 1329   NITRITE NEGATIVE 10/26/2017 0151   LEUKOCYTESUR MODERATE (A) 10/26/2017 0151      Imaging Results:    Dg Abdomen Peg Tube Location  Result Date: 01/15/2018 CLINICAL DATA:  New PEG tube placement. EXAM: ABDOMEN - 1 VIEW 50 cc [id number]-[id number] used to flush PEG tube. COMPARISON:  None. FINDINGS: Contrast extension through the PEG to demonstrate contrast in the stomach. IMPRESSION: Peg tube in the stomach as described. Electronically Signed   By: Abelardo Diesel M.D.   On: 02/06/2018 14:39    My personal review of EKG: Rhythm NSR severe hypokalemia   Assessment & Plan:    Active Problems:   Tobacco abuse   Hypokalemia   Protein-calorie malnutrition, severe   Pharyngeal mass   Head and neck cancer (HCC)   Hyponatremia   1. Hypokalemia-lab work showed potassium of less than 2, magnesium 2 g IV was replaced.  Will start IV replacement with KCl 10 mEq x 4.  Follow BMP in a.m.  Will also check serum magnesium  level. 2. Hyponatremia-sodium is 120, likely from poor p.o. intake, start IV normal saline at 50 mL/h.  Obtain serum osmolality.  Check BMP in a.m. 3. Malnutrition-Cynthia Church has severe protein calorie malnutrition, G-tube has been replaced in the ED.  Will continue with G-tube nutrition with Osmolite 1.5 Cal, 1 carton 5 times per day 4. Head and neck cancer-Cynthia Church has refused surgery and chemotherapy. 5. ? Suicidal ideation-  will place on suicide precaution,-psychiatric evaluation has been requested.    DVT Prophylaxis-   Lovenox  AM Labs Ordered, also please review Full Orders  Family Communication: Admission, patients condition and plan of care including tests being ordered have been discussed with the Cynthia Church  who indicate understanding and agree with the plan and Code Status.  Code Status:   full code  Admission status: Inpatient   Time spent in minutes : 60 minutes   Oswald Hillock M.D on 01/31/2018 at 3:52 PM  Between 7am to 7pm - Pager - [phone]. After 7pm go to [email or web address] - password [facility]  [facility] - Office  [phone]

## 2018-01-19 NOTE — ED Notes (Signed)
Pt resting with officer at bedside.  Pt cooperative.

## 2018-01-19 NOTE — ED Notes (Signed)
[facility] called due to patient trying to leave at this time.

## 2018-01-19 NOTE — ED Notes (Signed)
EDP at bedside attempting to insert g-tube.

## 2018-01-19 NOTE — ED Notes (Signed)
Mikey [employer] from [facility] social work to speak with Dr. Sabra Heck

## 2018-01-20 ENCOUNTER — Inpatient Hospital Stay (HOSPITAL_COMMUNITY): Payer: Medicare Other

## 2018-01-20 ENCOUNTER — Encounter (HOSPITAL_COMMUNITY): Payer: Self-pay | Admitting: Primary Care

## 2018-01-20 DIAGNOSIS — Z515 Encounter for palliative care: Secondary | ICD-10-CM

## 2018-01-20 DIAGNOSIS — E871 Hypo-osmolality and hyponatremia: Secondary | ICD-10-CM

## 2018-01-20 DIAGNOSIS — J9601 Acute respiratory failure with hypoxia: Secondary | ICD-10-CM | POA: Diagnosis present

## 2018-01-20 DIAGNOSIS — E876 Hypokalemia: Principal | ICD-10-CM

## 2018-01-20 DIAGNOSIS — K9423 Gastrostomy malfunction: Secondary | ICD-10-CM

## 2018-01-20 DIAGNOSIS — E878 Other disorders of electrolyte and fluid balance, not elsewhere classified: Secondary | ICD-10-CM

## 2018-01-20 DIAGNOSIS — C760 Malignant neoplasm of head, face and neck: Secondary | ICD-10-CM

## 2018-01-20 DIAGNOSIS — E039 Hypothyroidism, unspecified: Secondary | ICD-10-CM

## 2018-01-20 DIAGNOSIS — E43 Unspecified severe protein-calorie malnutrition: Secondary | ICD-10-CM

## 2018-01-20 DIAGNOSIS — Z7189 Other specified counseling: Secondary | ICD-10-CM

## 2018-01-20 DIAGNOSIS — F319 Bipolar disorder, unspecified: Secondary | ICD-10-CM

## 2018-01-20 DIAGNOSIS — J441 Chronic obstructive pulmonary disease with (acute) exacerbation: Secondary | ICD-10-CM | POA: Diagnosis present

## 2018-01-20 LAB — COMPREHENSIVE METABOLIC PANEL
ALBUMIN: 2.7 g/dL — AB (ref 3.5–5.0)
ALT: 23 U/L (ref 14–54)
AST: 43 U/L — AB (ref 15–41)
Alkaline Phosphatase: 121 U/L (ref 38–126)
Anion gap: 10 (ref 5–15)
BILIRUBIN TOTAL: 1.1 mg/dL (ref 0.3–1.2)
CO2: 43 mmol/L — ABNORMAL HIGH (ref 22–32)
Calcium: 8.2 mg/dL — ABNORMAL LOW (ref 8.9–10.3)
Chloride: 74 mmol/L — ABNORMAL LOW (ref 101–111)
Creatinine, Ser: 0.33 mg/dL — ABNORMAL LOW (ref 0.44–1.00)
GFR calc Af Amer: >60 mL/min (ref >60)
Glucose, Bld: 103 mg/dL — ABNORMAL HIGH (ref 65–99)
Potassium: 2.4 mmol/L — CL (ref 3.5–5.1)
Sodium: 127 mmol/L — ABNORMAL LOW (ref 135–145)
TOTAL PROTEIN: 6.4 g/dL — AB (ref 6.5–8.1)

## 2018-01-20 LAB — CBC
HEMATOCRIT: 45 % (ref 36.0–46.0)
HEMOGLOBIN: 15.4 g/dL — AB (ref 12.0–15.0)
MCH: 33.1 pg (ref 26.0–34.0)
MCHC: 34.2 g/dL (ref 30.0–36.0)
MCV: 96.8 fL (ref 78.0–100.0)
Platelets: 273 10*3/uL (ref 150–400)
RBC: 4.65 MIL/uL (ref 3.87–5.11)
RDW: 15.8 % — ABNORMAL HIGH (ref 11.5–15.5)
WBC: 9.3 10*3/uL (ref 4.0–10.5)

## 2018-01-20 LAB — OSMOLALITY: Osmolality: 247 mOsm/kg — CL (ref 275–295)

## 2018-01-20 MED ORDER — MORPHINE SULFATE (PF) 2 MG/ML IV SOLN
1.0000 mg | INTRAVENOUS | Status: DC | PRN
  Administered 2018-01-20: 1 mg via INTRAVENOUS
  Administered 2018-01-20 – 2018-01-21 (×2): 2 mg via INTRAVENOUS
  Filled 2018-01-20 (×3): qty 1

## 2018-01-20 MED ORDER — MAGNESIUM SULFATE 4 GM/100ML IV SOLN
4.0000 g | Freq: Once | INTRAVENOUS | Status: AC
  Administered 2018-01-20: 4 g via INTRAVENOUS
  Filled 2018-01-20: qty 100

## 2018-01-20 MED ORDER — HYDROMORPHONE HCL 1 MG/ML IJ SOLN
0.5000 mg | INTRAMUSCULAR | Status: DC | PRN
  Administered 2018-01-20 (×2): 0.5 mg via INTRAVENOUS
  Filled 2018-01-20 (×2): qty 0.5

## 2018-01-20 MED ORDER — IPRATROPIUM-ALBUTEROL 0.5-2.5 (3) MG/3ML IN SOLN: 3.0000 mL | Freq: Four times a day (QID) | RESPIRATORY_TRACT | Status: DC | PRN

## 2018-01-20 MED ORDER — SODIUM CHLORIDE 0.9 % IV SOLN: INTRAVENOUS | Status: DC

## 2018-01-20 MED ORDER — ALBUTEROL SULFATE (2.5 MG/3ML) 0.083% IN NEBU
2.5000 mg | INHALATION_SOLUTION | Freq: Four times a day (QID) | RESPIRATORY_TRACT | Status: DC
  Administered 2018-01-20: 2.5 mg via RESPIRATORY_TRACT
  Filled 2018-01-20: qty 3

## 2018-01-20 MED ORDER — POTASSIUM CHLORIDE 20 MEQ PO PACK
40.0000 meq | PACK | ORAL | Status: DC
  Filled 2018-01-20: qty 2

## 2018-01-20 MED ORDER — IPRATROPIUM-ALBUTEROL 0.5-2.5 (3) MG/3ML IN SOLN: 3.0000 mL | Freq: Four times a day (QID) | RESPIRATORY_TRACT | Status: DC

## 2018-01-20 MED ORDER — POTASSIUM CHLORIDE IN NACL 40-0.9 MEQ/L-% IV SOLN
INTRAVENOUS | Status: DC
  Administered 2018-01-20: 75 mL/h via INTRAVENOUS

## 2018-01-20 MED ORDER — POTASSIUM CHLORIDE 10 MEQ/100ML IV SOLN
10.0000 meq | INTRAVENOUS | Status: AC
  Administered 2018-01-20 (×4): 10 meq via INTRAVENOUS
  Filled 2018-01-20: qty 100

## 2018-01-20 MED ORDER — LORAZEPAM 2 MG/ML IJ SOLN
0.5000 mg | INTRAMUSCULAR | Status: DC | PRN
  Administered 2018-01-20 – 2018-01-21 (×2): 0.5 mg via INTRAVENOUS
  Filled 2018-01-20 (×2): qty 1

## 2018-01-20 MED ORDER — POTASSIUM CHLORIDE 10 MEQ/100ML IV SOLN: 10.0000 meq | INTRAVENOUS | Status: DC

## 2018-01-20 NOTE — Clinical Social Work Note (Signed)
Clinical Social Work Assessment  Patient Details  Name: Cynthia Church MRN: [id number] Date of Birth: 01-31-1966  Date of referral:  01/20/18               Reason for consult:  Other (Comment Required)(discharge plan)                Permission sought to share information with:    Permission granted to share information::     Name::        Agency::     Relationship::     Contact Information:  message left for [name] listed on chart.   Housing/Transportation Living arrangements for the past 2 months:  [place] of Information:  Patient Patient Interpreter Needed:  None Criminal Activity/Legal Involvement Pertinent to Current Situation/Hospitalization:  No - Comment as needed Significant Relationships:  Other(Comment)(friend) Lives with:  Friends Do you feel safe going back to the place where you live?  Yes Need for family participation in patient care:  Yes (Comment)  Care giving concerns:  Patient lives with a friend. Message left for friend to discuss care giving needs.    Social Worker assessment / plan: Patient was very minimally engaged. She did not provide any meaningful detail.  LCSW will follow case and continue to assist in discharge plan.   Employment status:    Insurance information:  Medicare PT Recommendations:  Not assessed at this time Information / Referral to community resources:     Patient/Family's Response to care:  Patient did not provide meaningful responses.   Patient/Family's Understanding of and Emotional Response to Diagnosis, Current Treatment, and Prognosis:  Will reassess.   Emotional Assessment Appearance:  Appears stated age Attitude/Demeanor/Rapport:    Affect (typically observed):  Accepting Orientation:  Oriented to Self, Oriented to Place, Oriented to  Time, Oriented to Situation Alcohol / Substance use:  Not Applicable Psych involvement (Current and /or in the community):  Yes (Comment)(psych cleared)  Discharge  Needs  Concerns to be addressed:  No discharge needs identified Readmission within the last 30 days:  No Current discharge risk:  None Barriers to Discharge:  No Barriers Identified   Ihor Gully, LCSW 01/20/2018, 12:32 PM

## 2018-01-20 NOTE — Progress Notes (Signed)
On call MD paged regarding serum osmolarity 247 @ present,waiting for response.

## 2018-01-20 NOTE — Progress Notes (Signed)
Initial Nutrition Assessment  DOCUMENTATION CODES:   Severe malnutrition in context of chronic illness, Underweight  INTERVENTION:   No nutrition interventions at this time unless further consulted.   NUTRITION DIAGNOSIS:   Severe Malnutrition related to cancer and cancer related treatments, inability to eat, altered GI function as evidenced by NPO status, percent weight loss, severe fat depletion, severe muscle depletion, energy intake < or equal to 75% for > or equal to 1 month.  GOAL:   Other (Comment)(pt on end of life care)  MONITOR:   Other (Comment)(end of life care)  REASON FOR ASSESSMENT:   Malnutrition Screening Tool   ASSESSMENT:  52 y/o female with Hx of polysubstance abuse, bipolar, head/neck cancer, COPD.  Admitted for G-tube having fallen out.  Presented with severe hypokalemia, hyponatremia and hypomagnesemia.  MST score of 2.  Pt found not able to communicate well.  NFPE shows severe malnutrition everywhere.  Pt is currently 73 lbs and has a BMI of 11.92, also clear signs of malnutrition.  Wt hx shows UBW was probably 125 lbs. Pt wt hx shows wt loss of 26% BW over 2.5 months, which also qualifies pt for severe malnutrition diagnosis.    Spoke with RN and pt is going to be on end of life care.  G-tube is not going to be replaced at this time.  No nutrition related interventions are being pursued unless further nutrition care is requested or consulted.    Labs reviewed: Recent Labs  Lab 01/14/2018 1412 01/20/18 0548  NA 120* 127*  K <2.0* 2.4*  CL <65* 74*  CO2 47* 43*  BUN 6 <5*  CREATININE 0.38* 0.33*  CALCIUM 8.4* 8.2*  MG 1.4*  --   GLUCOSE 104* 103*   Medications: Lovenox, osmolite (if G-tube is replaced), nicoderm, saline, dilaudid   Past Medical History:  Diagnosis Date  . Alcoholism ([place])   . Bipolar affective disorder, manic ([place]) 1993   [facility]'d [facility]  . Cancer ([place]) 1989   uterine  . Chronic pain   . COPD (chronic obstructive  pulmonary disease) ([place])   . Headache(784.0) 09/25/2011  . Hyperthyroidism 1993  . Low back pain   . Mental disorder   . Personality disorders 1993   NUTRITION - FOCUSED PHYSICAL EXAM:    Most Recent Value  Orbital Region  Severe depletion  Upper Arm Region  Severe depletion  Thoracic and Lumbar Region  Severe depletion  Buccal Region  Severe depletion  Temple Region  Severe depletion  Clavicle Bone Region  Severe depletion  Clavicle and Acromion Bone Region  Severe depletion  Scapular Bone Region  Severe depletion  Dorsal Hand  Severe depletion  Patellar Region  Severe depletion  Anterior Thigh Region  Severe depletion  Posterior Calf Region  Severe depletion     Diet Order:   Diet Order           Diet NPO time specified  Diet effective now         EDUCATION NEEDS:   No education needs have been identified at this time  Skin:  Skin Assessment: Skin Integrity Issues: Skin Integrity Issues:: Incisions, Other (Comment) Incisions: Lower/mid abdomen: red, closed with staples Other: Jaundice/ecchymosis: bilateral arm/leg  Last BM:  6/9 (type 6)  Height:   Ht Readings from Last 1 Encounters:  01/25/2018 5\' 6"  (1.676 m)   Weight:   Wt Readings from Last 1 Encounters:  02/09/2018 73 lb 13.7 oz (33.5 kg)   Ideal Body Weight:  59 kg  BMI:  Body mass index is 11.92 kg/m.  Estimated Nutritional Needs:   Not appropriate for pt: end of life care

## 2018-01-20 NOTE — Clinical Social Work Note (Signed)
LCSW spoke with [name], friend that patient lives with. Mr. [name] stated that patient is unable to care for himself and he and her boyfriend are unable to continue providing care for her.     Trannie Bardales, Clydene Pugh, LCSW

## 2018-01-20 NOTE — Progress Notes (Signed)
PROGRESS NOTE    Cynthia Church  IAX:[id number] DOB: 03/25/1966 DOA: 01/23/2018 PCP: Patient, No Pcp Per    Brief Narrative:  52 year old female with a history of head neck cancer, COPD, polysubstance abuse, admitted to the hospital with severe hypokalemia, hyponatremia and hypomagnesemia.  She is G-tube dependent and apparently her G-tube had come out prior to admission.  She is been admitted for electrolyte replacement therapy.  She is developed worsening shortness of breath.  Palliative care following for goals of care.   Assessment & Plan:   Active Problems:   Tobacco abuse   Bipolar disorder ([place])   Hypothyroidism   Hypokalemia   Protein-calorie malnutrition, severe   Pharyngeal mass   Head and neck cancer (HCC)   Hyponatremia   Acute respiratory failure with hypoxia (HCC)   COPD with acute exacerbation ([place])   1. Acute respiratory failure with hypoxia.  Likely related to COPD exacerbation.  Will check chest x-ray.  She is wheezing and rhonchorous.  We will start patient on DuoNeb treatments. 2. COPD exacerbation.  Start on duo nebs.  If she fails to improve, can consider steroids. 3. Hypokalemia/hypomagnesemia.  Suspect this is related to decreased p.o. intake.  When patient had presented, her PEG tube had been removed.  This was replaced in the emergency room, but has since been taken out again.  Electrolytes are being replaced. 4. Advanced tonsil carcinoma, squamous cell.  Being followed by oncology.  Not felt to be candidate for radiation or palliative chemotherapy due to poor compliance and follow-up.  On last visit with oncology on 5/3, recommendations are for hospice. 5. Hyponatremia.  Related to volume depletion.  Improving with IV fluids. 6. Severe protein calorie malnutrition.  Related to decreased p.o. intake, PEG tube status.  Will consult nutrition 7. Question involuntary commitment.  Seen by psychiatry and was not felt to be a candidate for inpatient psychiatry.  She  has been cleared from their standpoint. 8. Goals of care.  Discussed goals of care with patient.  She reported that in the event her condition would continue to decline necessitating CPR/intubation, she would prefer for her death to occur naturally without heroic measures.  Palliative care consulted to speak to patient and confirm goals.  Appears to be a good candidate for hospice.   DVT prophylaxis: Lovenox Code Status: Full code Family Communication: No family present Disposition Plan: To be determined, previously living at home with a friend.  According to her friend, he does not feel that he can continue to take care of her   Consultants:   Palliative care  Procedures:     Antimicrobials:       Subjective: Patient seen in room, lying in bed.  Appears to be short of breath.  Conversation is minimal.  Repeatedly said that she wants her nerve medicine as well as "her dope".  We discussed her shortness of breath.  She says she cannot tolerate a nasal cannula and would prefer a mask.  Nonrebreather applied.  We discussed further escalation of care including intubation and CPR.  When asked if she would want to undergo these heroic measures versus allowing a natural death with comfort, and she says she would prefer a natural death.  Objective: Vitals:   January 25, 2018 1126 2018/01/25 1133 01/25/18 1158 2018/01/25 1225  BP:      Pulse:      Resp:      Temp:      TempSrc:      SpO2: (!) 82% Mar[place] Kitchen)  81% (!) 87% (!) 88%  Weight:      Height:        Intake/Output Summary (Last 24 hours) at 01/20/2018 1300 Last data filed at 01/20/2018 0900 Gross per 24 hour  Intake 602.5 ml  Output -  Net 602.5 ml   Filed Weights   02/09/2018 1301 02/03/2018 1726  Weight: 33.1 kg (73 lb) 33.5 kg (73 lb 13.7 oz)    Examination:  General exam: Laying in bed, appears chronically ill, cachectic Respiratory system: Bilateral wheezing and rhonchi.  Increased respiratory effort. Cardiovascular system: S1 & S2  heard, RRR. No JVD, murmurs, rubs, gallops or clicks. No pedal edema. Gastrointestinal system: Abdomen is nondistended, soft and nontender. No organomegaly or masses felt. Normal bowel sounds heard. Central nervous system: No focal neurological deficits. Extremities: Symmetric 5 x 5 power. Skin: No rashes, lesions or ulcers Psychiatry: Judgement and insight appear normal. Mood & affect appropriate.     Data Reviewed: I have personally reviewed following labs and imaging studies  CBC: Recent Labs  Lab 02/08/2018 1412 01/20/18 0548  WBC 14.2* 9.3  NEUTROABS 12.4*  --   HGB 15.6* 15.4*  HCT 44.1 45.0  MCV 95.0 96.8  PLT 256 938   Basic Metabolic Panel: Recent Labs  Lab 02/05/2018 1412 01/20/18 0548  NA 120* 127*  K <2.0* 2.4*  CL <65* 74*  CO2 47* 43*  GLUCOSE 104* 103*  BUN 6 <5*  CREATININE 0.38* 0.33*  CALCIUM 8.4* 8.2*  MG 1.4*  --    GFR: Estimated Creatinine Clearance: 44.5 mL/min (A) (by C-G formula based on SCr of 0.33 mg/dL (L)). Liver Function Tests: Recent Labs  Lab 02/09/2018 1412 01/20/18 0548  AST 48* 43*  ALT 25 23  ALKPHOS 118 121  BILITOT 1.2 1.1  PROT 6.6 6.4*  ALBUMIN 2.7* 2.7*   No results for input(s): LIPASE, AMYLASE in the last 168 hours. No results for input(s): AMMONIA in the last 168 hours. Coagulation Profile: No results for input(s): INR, PROTIME in the last 168 hours. Cardiac Enzymes: No results for input(s): CKTOTAL, CKMB, CKMBINDEX, TROPONINI in the last 168 hours. BNP (last 3 results) No results for input(s): PROBNP in the last 8760 hours. HbA1C: No results for input(s): HGBA1C in the last 72 hours. CBG: No results for input(s): GLUCAP in the last 168 hours. Lipid Profile: No results for input(s): CHOL, HDL, LDLCALC, TRIG, CHOLHDL, LDLDIRECT in the last 72 hours. Thyroid Function Tests: No results for input(s): TSH, T4TOTAL, FREET4, T3FREE, THYROIDAB in the last 72 hours. Anemia Panel: No results for input(s): VITAMINB12,  FOLATE, FERRITIN, TIBC, IRON, RETICCTPCT in the last 72 hours. Sepsis Labs: No results for input(s): PROCALCITON, LATICACIDVEN in the last 168 hours.  No results found for this or any previous visit (from the past 240 hour(s)).       Radiology Studies: Dg Abdomen Peg Tube Location  Result Date: 02/01/2018 CLINICAL DATA:  New PEG tube placement. EXAM: ABDOMEN - 1 VIEW 50 cc [id number]-300 used to flush PEG tube. COMPARISON:  None. FINDINGS: Contrast extension through the PEG to demonstrate contrast in the stomach. IMPRESSION: Peg tube in the stomach as described. Electronically Signed   By: Abelardo Diesel M.D.   On: 01/30/2018 14:39        Scheduled Meds: . albuterol  2.5 mg Nebulization Q6H  . chlorhexidine  15 mL Mouth Rinse BID  . enoxaparin (LOVENOX) injection  30 mg Subcutaneous Q24H  . feeding supplement (OSMOLITE 1.5 CAL)  237  mL Per Tube 5 times per day  . mouth rinse  15 mL Mouth Rinse q12n4p  . nicotine  21 mg Transdermal Daily   Continuous Infusions: . 0.9 % NaCl with KCl 40 mEq / L 75 mL/hr (01/20/18 0918)  . potassium chloride 10 mEq (01/20/18 1200)     LOS: 1 day    Time spent: 68mins Greater than 50% of this time spent in direct contact with patient discussing her acute illness, further goals of care including intubation/CPR, importance of wearing oxygen.  Time also spent reviewing patient chart/records and coordinating care with palliative care services.    Kathie Dike, MD [facility] Pager [phone]  If 7PM-7AM, please contact night-coverage [email or web address] Password [email or web address] 01/20/2018, 1:00 PM

## 2018-01-20 NOTE — Progress Notes (Signed)
Upon morning rounds of checking the patient, patient was sitting up and struggling to breathe more than usual. Checked o2 saturations and they were ranging 82-84%, and even got down to 79%. Applied 2L [place] but patient is refusing to wear it. Paged MD, to come see patient shortly.

## 2018-01-20 NOTE — Consult Note (Signed)
Consultation Note Date: 01/20/2018   Patient Name: Cynthia Church  DOB: [birth year]  MRN: [id number]  Age / Sex: 52 y.o., female  PCP: Patient, No Pcp Per Referring Physician: Kathie Dike, MD  Reason for Consultation: Establishing goals of care  HPI/Patient Profile: 52 y.o. female  with past medical history of bipolar disorder, tobacco abuse, COPD, head and neck cancer (declined further surgical or chemotherapy treatments, has since been discharged by oncology) uterine cancer, chronic pain syndrome, crack cocaine abuse, G-tube question removed by patient, admitted on 02/08/2018 with hypokalemia, hyponatremia, malnutrition, head neck cancer.   Clinical Assessment and Goals of Care: Cynthia Church is lying quietly in bed.  She appears frail, cachectic.  She is unable to wear her oxygen, and it is lying near her face on her bed.  She is unable to communicate other than simple yes and no statements.  She appears to be actively dying, and only speaks when pressed. Conference with nursing staff related to symptom management, plan of care, comfort care, anticipated hospital death. Call to responsible party, Cynthia Church.  Cynthia Church shares that he is healthcare power of attorney.  He talks about Cynthia Church struggle in her life.  He states that she was sterilized as a teen, lived in the [facility] home for difficult children as a child.  Cynthia Church states that Cynthia Church has struggled with substance abuse and mental illness her whole life.  We talked about her current health situation.  I share that Cynthia Church appears to be actively dying.  Cynthia Church states that he anticipated this, and also believes that she is dying.  We talked about caring for her by focusing on comfort and dignity.  Liberalizing medications for pain and anxiety.  Cynthia Church readily agrees. Conference with nursing staff related to comfort measures only. Notified hospitalist of Cynthia Church POA  choice for full comfort care.  Healthcare power of attorney HCPOA -responsible party listed in chart, Cynthia Church, states that he is legal healthcare power of attorney.  No paperwork on file.   SUMMARY OF RECOMMENDATIONS   Comfort and dignity at end of life, full comfort care. Appears to be actively dying.  Code Status/Advance Care Planning:  DNR -endorsed by patient to "allow a natural death".  Also endorsed by healthcare power of attorney for comfort care only.  Symptom Management:   Liberalize medications for comfort.  Palliative Prophylaxis:   Frequent Pain Assessment and Turn Reposition  Additional Recommendations (Limitations, Scope, Preferences):  Full Comfort Care  Psycho-social/Spiritual:   Desire for further Chaplaincy support:no  Additional Recommendations: Caregiving  Support/Resources  Prognosis:   Hours - Days -anticipate hours.  Discharge Planning: Anticipated Hospital Death      Primary Diagnoses: Present on Admission: . Hypokalemia . Tobacco abuse . Protein-calorie malnutrition, severe . Pharyngeal mass . Head and neck cancer ([place]) . Acute respiratory failure with hypoxia ([place]) . COPD with acute exacerbation ([place]) . Bipolar disorder ([place]) . Hypothyroidism   I have reviewed the medical record, interviewed the patient and family, and examined the patient. The  following aspects are pertinent.  Past Medical History:  Diagnosis Date  . Alcoholism ([place])   . Bipolar affective disorder, manic ([place]) 1993   [facility]'d [facility]  . Cancer ([place]) 1989   uterine  . Chronic pain   . COPD (chronic obstructive pulmonary disease) ([place])   . Headache(784.0) 09/25/2011  . Hyperthyroidism 1993  . Low back pain   . Mental disorder   . Personality disorders 1993   Social History   Socioeconomic History  . Marital status: Divorced    Spouse name: Not on file  . Number of children: Not on file  . Years of education: Not on file  . Highest education  level: Not on file  Occupational History  . Not on file  Social Needs  . Financial resource strain: Not on file  . Food insecurity:    Worry: Not on file    Inability: Not on file  . Transportation needs:    Medical: Not on file    Non-medical: Not on file  Tobacco Use  . Smoking status: Current Every Day Smoker    Packs/day: 3.00    Years: 28.00    Pack years: 84.00    Types: Cigarettes  . Smokeless tobacco: Never Used  Substance and Sexual Activity  . Alcohol use: Yes    Comment: daily  . Drug use: Yes    Types: Marijuana, Cocaine    Comment: yesterday  . Sexual activity: Never  Lifestyle  . Physical activity:    Days per week: Not on file    Minutes per session: Not on file  . Stress: Not on file  Relationships  . Social connections:    Talks on phone: Not on file    Gets together: Not on file    Attends religious service: Not on file    Active member of club or organization: Not on file    Attends meetings of clubs or organizations: Not on file    Relationship status: Not on file  Other Topics Concern  . Not on file  Social History Narrative  . Not on file   Family History  Problem Relation Age of Onset  . Cirrhosis Father    Scheduled Meds: . chlorhexidine  15 mL Mouth Rinse BID  . enoxaparin (LOVENOX) injection  30 mg Subcutaneous Q24H  . feeding supplement (OSMOLITE 1.5 CAL)  237 mL Per Tube 5 times per day  . ipratropium-albuterol  3 mL Nebulization Q6H  . mouth rinse  15 mL Mouth Rinse q12n4p  . nicotine  21 mg Transdermal Daily   Continuous Infusions: . 0.9 % NaCl with KCl 40 mEq / L 75 mL/hr (01/20/18 0918)  . potassium chloride 10 mEq (01/20/18 1510)   PRN Meds:.HYDROmorphone (DILAUDID) injection, LORazepam, ondansetron **OR** ondansetron (ZOFRAN) IV Medications Prior to Admission:  Prior to Admission medications   Medication Sig Start Date End Date Taking? Authorizing Provider  lidocaine-prilocaine (EMLA) cream Apply to affected area once  11/28/17   Derek Jack, MD  LORazepam (ATIVAN) 0.5 MG tablet Take 1 tablet by mouth daily as needed. 12/16/17   [provider]  nicotine (NICODERM CQ - DOSED IN MG/24 HOURS) 21 mg/24hr patch Place 1 patch (21 mg total) onto the skin daily. 11/20/17   Derek Jack, MD  Nutritional Supplements (FEEDING SUPPLEMENT, OSMOLITE 1.5 CAL,) LIQD Give 1 carton 5 times per day (8am, 11 am, 2pm, 5pm and 8pm) in feeding tube.   Flush with 36ml of water before and after  each feeding. 11/22/17   Derek Jack, MD  ondansetron (ZOFRAN) 8 MG tablet Take 1 tablet (8 mg total) by mouth 2 (two) times daily as needed. Start on the third day after chemotherapy. 11/28/17   Derek Jack, MD  oxyCODONE-acetaminophen (PERCOCET/ROXICET) 5-325 MG tablet Place 1 tablet into feeding tube every 6 (six) hours as needed. 11/20/17   Derek Jack, MD  prochlorperazine (COMPAZINE) 10 MG tablet Take 1 tablet (10 mg total) by mouth every 6 (six) hours as needed (Nausea or vomiting). 11/28/17   Derek Jack, MD   No Known Allergies Review of Systems  Unable to perform ROS: Mental status change    Physical Exam  Constitutional: No distress.  Appears to be actively dying, minimal responses  HENT:  Head: Atraumatic.  Cardiovascular: Normal rate.  Pulmonary/Chest: She is in respiratory distress.  Appears to be agonal breathing  Abdominal: Soft. She exhibits no distension.  Musculoskeletal: She exhibits no edema.  Extremely cachectic  Neurological:  Obtunded, actively dying  Skin: Skin is warm and dry.  Psychiatric:  Obtunded, appears to be actively dying.  Nursing note and vitals reviewed.   Vital Signs: BP (!) 146/111 (BP Location: Left Arm)   Pulse 91   Temp 99.5 F (37.5 C) (Axillary) Comment: 99.5  Resp (!) 22   Ht 5\' 6"  (1.676 m)   Wt 33.5 kg (73 lb 13.7 oz)   SpO2 (!) 81%   BMI 11.92 kg/m  Pain Scale: 0-10   Pain Score: Asleep   SpO2: SpO2: (!) 81 % O2  Device:SpO2: (!) 81 % O2 Flow Rate: .O2 Flow Rate (L/min): 10 L/min  IO: Intake/output summary:   Intake/Output Summary (Last 24 hours) at 01/20/2018 1517 Last data filed at 01/20/2018 1230 Gross per 24 hour  Intake 602.5 ml  Output -  Net 602.5 ml    LBM: Last BM Date: 01/12/2018 Baseline Weight: Weight: 33.1 kg (73 lb) Most recent weight: Weight: 33.5 kg (73 lb 13.7 oz)     Palliative Assessment/Data:   Flowsheet Rows     Most Recent Value  Intake Tab  Referral Department  Hospitalist  Unit at Time of Referral  Cardiac/Telemetry Unit  Palliative Care Primary Diagnosis  Other (Comment)  Date Notified  01/20/18  Palliative Care Type  Return patient Palliative Care  Reason for referral  Clarify Goals of Care  Date of Admission  02/03/2018  Date first seen by Palliative Care  01/20/18  # of days Palliative referral response time  0 Day(s)  # of days IP prior to Palliative referral  1  Clinical Assessment  Palliative Performance Scale Score  10%  Pain Max last 24 hours  Not able to report  Pain Min Last 24 hours  Not able to report  Dyspnea Max Last 24 Hours  Not able to report  Dyspnea Min Last 24 hours  Not able to report  Psychosocial & Spiritual Assessment  Palliative Care Outcomes  Patient/Family meeting held?  Yes  Palliative Care Outcomes  Provided end of life care assistance, Changed to focus on comfort  Patient/Family wishes: Interventions discontinued/not started   Mechanical Ventilation, BiPAP, Vasopressors, Tube feedings/TPN      Time In: 1420 Time Out: 1530 Time Total: 70 minutes Greater than 50%  of this time was spent counseling and coordinating care related to the above assessment and plan.  Signed by: Drue Novel, NP   Please contact Palliative Medicine Team phone at [phone] for questions and concerns.  For individual provider:  See Amion

## 2018-01-20 NOTE — Progress Notes (Signed)
MD visited patient, verbal order to place patient on a non-re breather mask and order Q6 breathing treatments. Pt placed on continuous pulse oximetry. Pt still non-compliant with keeping oxygen on. RT to come assess patient upon breathing treatment, palliative consult also in place. Will continue to monitor.

## 2018-01-20 NOTE — Progress Notes (Addendum)
CRITICAL VALUE ALERT  Critical Value:  2.4 Potassium  Date & Time Notied:  01/20/18 @ 0730.  Provider Notified: Roderic Palau, MD.  Orders Received/Actions taken: Awaiting orders.  Potassium runs 1x4 hours as of 1000.

## 2018-01-21 ENCOUNTER — Ambulatory Visit (HOSPITAL_COMMUNITY): Payer: Self-pay

## 2018-01-21 ENCOUNTER — Encounter (HOSPITAL_COMMUNITY): Payer: Self-pay | Admitting: Dietician

## 2018-01-28 ENCOUNTER — Encounter (HOSPITAL_COMMUNITY): Payer: Self-pay | Admitting: Dietician

## 2018-01-28 ENCOUNTER — Ambulatory Visit (HOSPITAL_COMMUNITY): Payer: Self-pay

## 2018-02-04 ENCOUNTER — Ambulatory Visit (HOSPITAL_COMMUNITY): Payer: Self-pay

## 2018-02-04 ENCOUNTER — Encounter (HOSPITAL_COMMUNITY): Payer: Self-pay | Admitting: Dietician

## 2018-02-10 NOTE — Progress Notes (Signed)
Pt deceased at 66 this morning, found by [place], NT. Death pronounced and verified by B. Karesa Maultsby, RN and M. Lovena Le, RN. MD notified, to sign death certificate. Post-mortem flowsheet completed. [place] donor services called, potential donor for eyes only. [facility] Counts notified of patient's death. NT currently performing post-mortem care including eye prep. Patient to be taken to the morgue after prep.

## 2018-02-10 NOTE — Discharge Summary (Signed)
Death Summary  Cynthia Church ZOX:[id number] DOB: November 03, 1965 DOA: 2018-01-23  PCP: Patient, No Pcp Per  Admit date: 01-23-18 Date of Death: 01-25-18 Time of Death: 0725  History of present illness:  52 year old female with a history of head and neck cancer, COPD, polysubstance abuse and bipolar disorder was brought to the hospital by a friend that she lives with who is also her power of attorney.  Patient is G-tube dependent and apparently her G-tube had been removed prior to admission.  Basic labs were drawn that showed severe hypokalemia as well as hypomagnesemia.  She was also dehydrated and was hyponatremic.  She was admitted to the hospital for electrolyte replacements.  Shortly after admission, she developed worsening hypoxia.  She refused to wear any oxygen.  Review of recent records including follow-up with oncology indicated that she remains significantly noncompliant with her visits.  It was felt that she was not a candidate for any radiation or chemotherapy due to her poor follow-up and ongoing substance abuse issues.  She was continuing to lose weight and failing to thrive.  At her last visit, recommendations were for hospice enrollment.  During her admission, goals of care were discussed with the patient and power of attorney by palliative care.  It was decided the patient be DNR and focus would be on comfort measures.  Patient received supportive treatment and on 2023/01/26 at 7:25 AM, patient was found to be without pulse and respirations and was pronounced dead at that time.  Final Diagnoses:  Active Problems:   Tobacco abuse   Bipolar disorder (HCC)   Hypothyroidism   Hypokalemia   Protein-calorie malnutrition, severe   Pharyngeal mass   Head and neck cancer (HCC)   Hyponatremia   Acute respiratory failure with hypoxia (HCC)   COPD with acute exacerbation (HCC)   Goals of care, counseling/discussion   DNR (do not resuscitate) discussion   End of life care    The results of  significant diagnostics from this hospitalization (including imaging, microbiology, ancillary and laboratory) are listed below for reference.    Significant Diagnostic Studies: Dg Abdomen Peg Tube Location  Result Date: 01-23-18 CLINICAL DATA:  New PEG tube placement. EXAM: ABDOMEN - 1 VIEW 50 cc Isovue-300 used to flush PEG tube. COMPARISON:  None. FINDINGS: Contrast extension through the PEG to demonstrate contrast in the stomach. IMPRESSION: Peg tube in the stomach as described. Electronically Signed   By: Abelardo Diesel M.D.   On: 01-23-18 14:39   Dg Chest Port 1 View  Result Date: 01/20/2018 CLINICAL DATA:  Acute respiratory failure EXAM: PORTABLE CHEST 1 VIEW COMPARISON:  None. FINDINGS: Cardiac shadows within normal limits. The lungs are well aerated bilaterally. Patchy infiltrate is noted particularly in the right lung base. No sizable effusion is noted. Bilateral nipple shadows are seen. Old rib fractures are noted on the left. IMPRESSION: Patchy right basilar infiltrate. Electronically Signed   By: Inez Catalina M.D.   On: 01/20/2018 14:29    Microbiology: No results found for this or any previous visit (from the past 240 hour(s)).   Labs: Basic Metabolic Panel: Recent Labs  Lab Jan 23, 2018 1412 01/20/18 0548  NA 120* 127*  K <2.0* 2.4*  CL <65* 74*  CO2 47* 43*  GLUCOSE 104* 103*  BUN 6 <5*  CREATININE 0.38* 0.33*  CALCIUM 8.4* 8.2*  MG 1.4*  --    Liver Function Tests: Recent Labs  Lab 23-Jan-2018 1412 01/20/18 0548  AST 48* 43*  ALT 25 23  ALKPHOS 118 121  BILITOT 1.2 1.1  PROT 6.6 6.4*  ALBUMIN 2.7* 2.7*   No results for input(s): LIPASE, AMYLASE in the last 168 hours. No results for input(s): AMMONIA in the last 168 hours. CBC: Recent Labs  Lab 01/20/2018 1412 01/20/18 0548  WBC 14.2* 9.3  NEUTROABS 12.4*  --   HGB 15.6* 15.4*  HCT 44.1 45.0  MCV 95.0 96.8  PLT 256 273   Cardiac Enzymes: No results for input(s): CKTOTAL, CKMB, CKMBINDEX, TROPONINI in  the last 168 hours. D-Dimer No results for input(s): DDIMER in the last 72 hours. BNP: Invalid input(s): POCBNP CBG: No results for input(s): GLUCAP in the last 168 hours. Anemia work up No results for input(s): VITAMINB12, FOLATE, FERRITIN, TIBC, IRON, RETICCTPCT in the last 72 hours. Urinalysis    Component Value Date/Time   COLORURINE YELLOW 10/26/2017 0151   APPEARANCEUR HAZY (A) 10/26/2017 0151   LABSPEC >1.046 (H) 10/26/2017 0151   PHURINE 7.0 10/26/2017 0151   GLUCOSEU NEGATIVE 10/26/2017 0151   HGBUR SMALL (A) 10/26/2017 0151   BILIRUBINUR NEGATIVE 10/26/2017 0151   KETONESUR 5 (A) 10/26/2017 0151   PROTEINUR NEGATIVE 10/26/2017 0151   UROBILINOGEN 0.2 06/01/2015 1329   NITRITE NEGATIVE 10/26/2017 0151   LEUKOCYTESUR MODERATE (A) 10/26/2017 0151   Sepsis Labs Invalid input(s): PROCALCITONIN,  WBC,  LACTICIDVEN     SIGNED:  Kathie Dike, MD  [facility] 2018-02-14, 6:14 PM Pager   If 7PM-7AM, please contact night-coverage [email or web address] Password [email or web address]

## 2018-02-10 NOTE — Plan of Care (Signed)
Continue planned regimen 

## 2018-02-10 NOTE — Progress Notes (Signed)
Notified by patient friend [name] yesterday that he is not responsible for any funeral or cremation plans. States the patient never filled out proper paperwork so it is in the hands of social services now? Patient friend was adamant I put the statement in documentation for medical staff to be able to see.

## 2018-02-10 DEATH — deceased

## 2018-02-11 ENCOUNTER — Encounter (HOSPITAL_COMMUNITY): Payer: Self-pay | Admitting: Dietician

## 2018-02-11 ENCOUNTER — Ambulatory Visit (HOSPITAL_COMMUNITY): Payer: Self-pay

## 2019-11-23 IMAGING — CT CT NECK W/ CM
4 of 5 series · 15 of 33 positions shown, 17 images · IV contrast (iopamidol)
Comparison: 03/05/2016 CT cervical spine. 10/08/2017 cervical spine
radiographs.

CLINICAL DATA: 51 y/o F; 6 months of sore throat. Abnormal
radiographs.

EXAM:
CT NECK WITH CONTRAST
TECHNIQUE: Multidetector CT imaging of the neck was performed using the
standard protocol following the bolus administration of intravenous
contrast.
CONTRAST:  75mL 0NYRYT-5AA IOPAMIDOL (0NYRYT-5AA) INJECTION 61%

[Series 2: axial neck · axial · 0.49mm/px · z∈[+1299,+1413]mm · 3 of 115 slices shown, 4 images]
[im 29/115  soft-tissue]
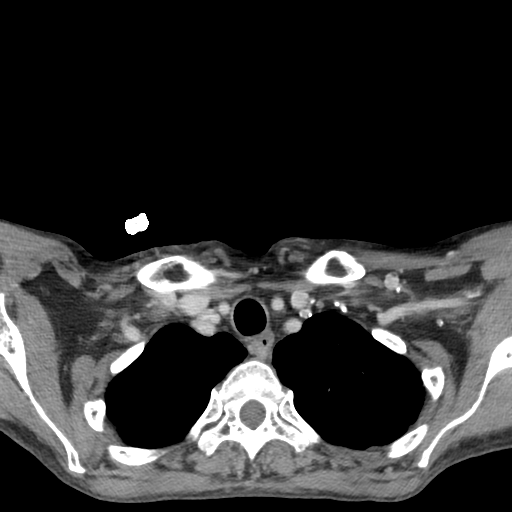
[im 29/115  bone]
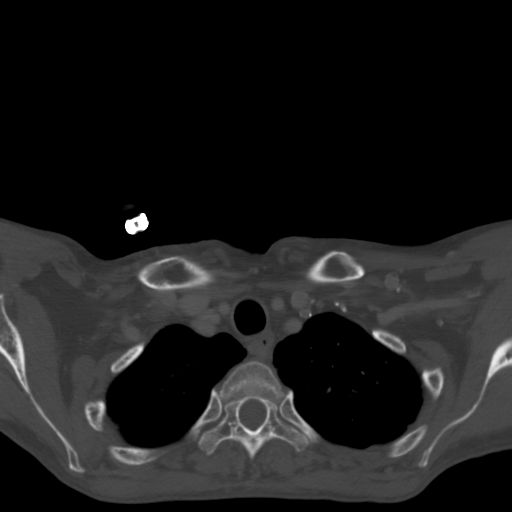
[im 58/115  bone]
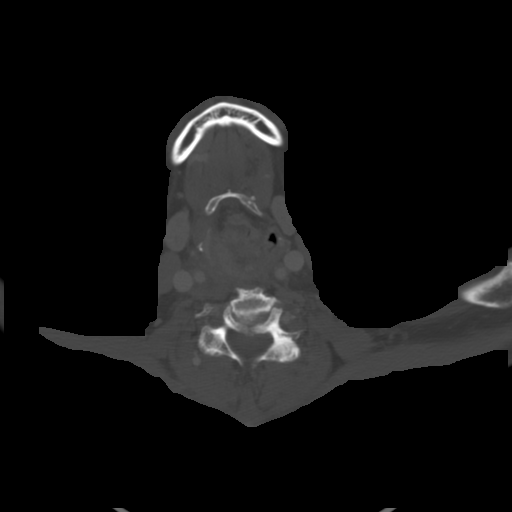
[im 86/115  bone]
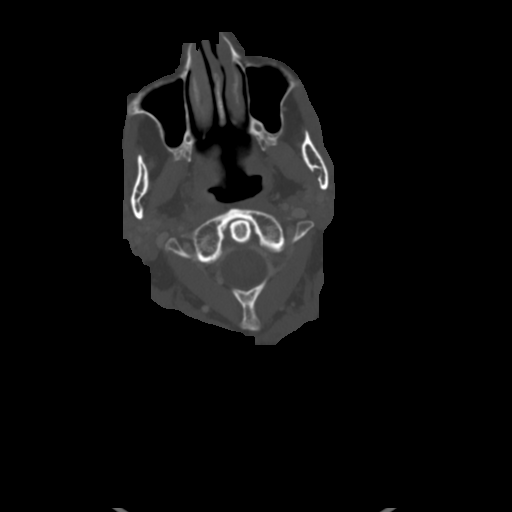

[Series 6: coronal neck · coronal · 0.45mm/px · 3 of 110 slices shown]
[im 22/110  bone]
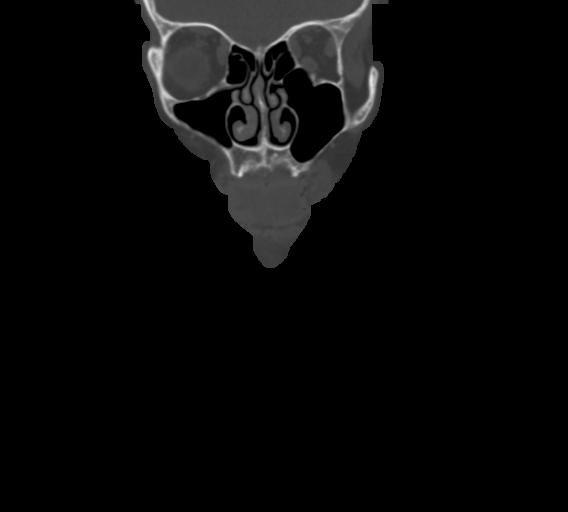
[im 44/110  bone]
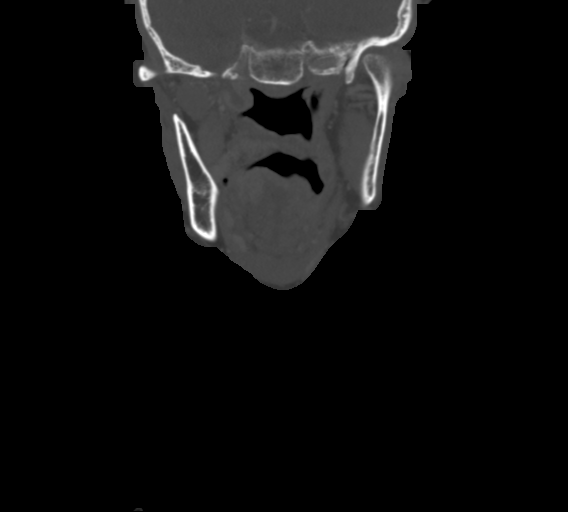
[im 66/110  bone]
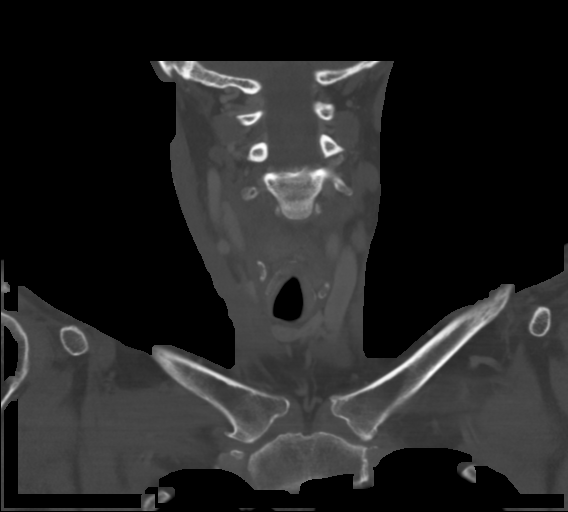

[Series 7: sagittal neck · sagittal · 0.45mm/px · 5 of 101 slices shown, 6 images]
[im 34/101  bone]
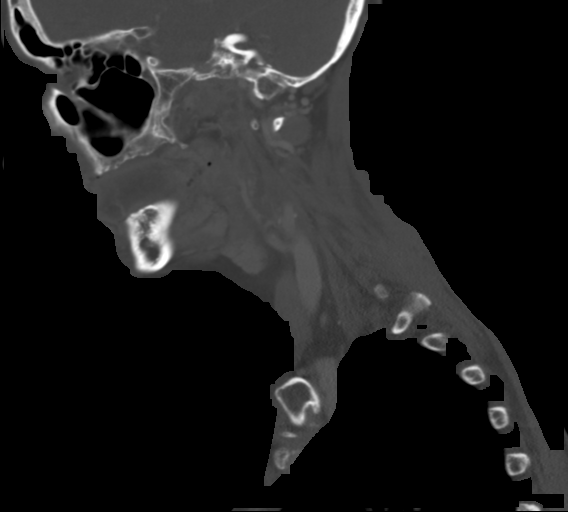
[im 42/101  bone]
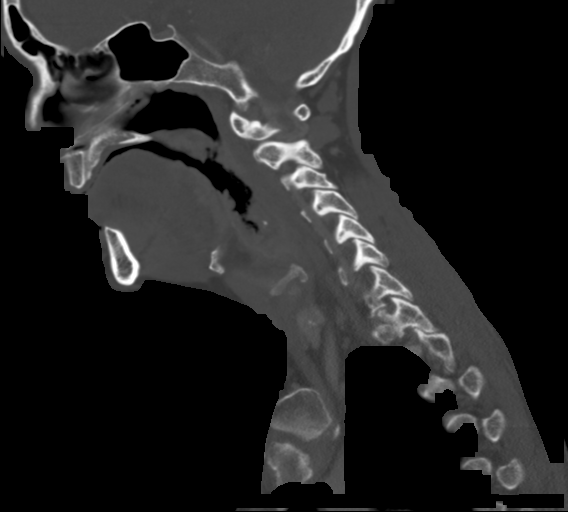
[im 51/101  soft-tissue]
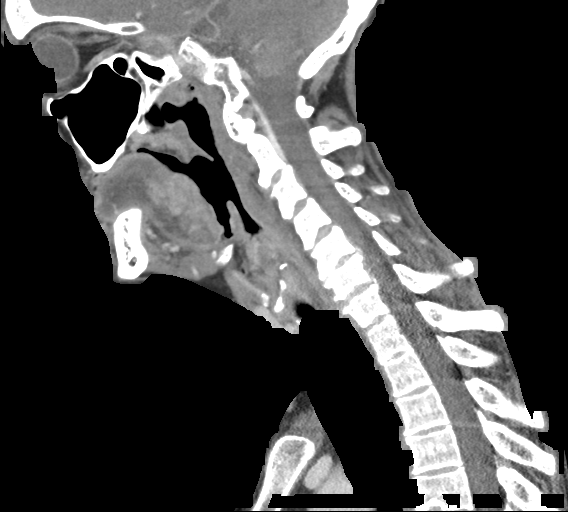
[im 51/101  bone]
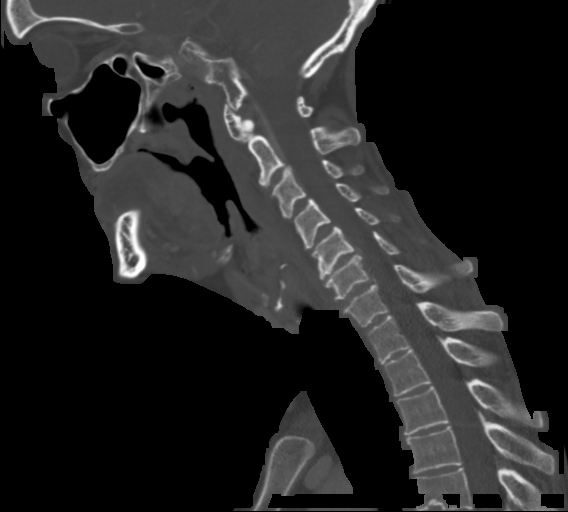
[im 59/101  bone]
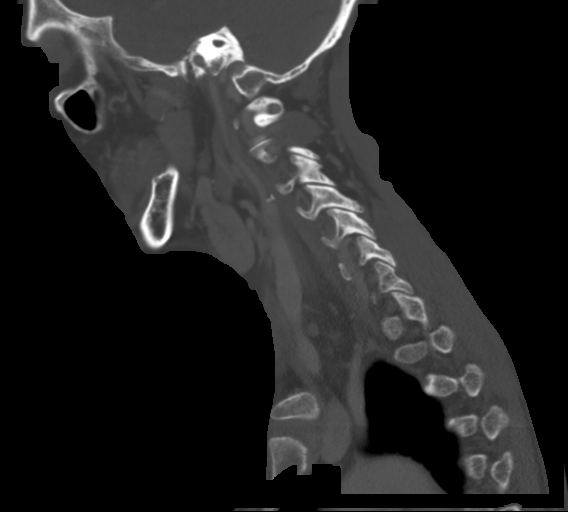
[im 67/101  bone]
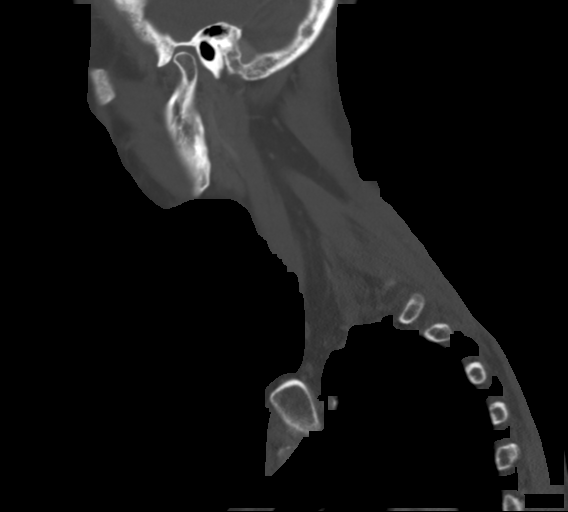

[Series 8: orthogonal ax · axial · 0.39mm/px · z∈[+1252,+1363]mm · 4 of 110 slices shown]
[im 22/110  bone]
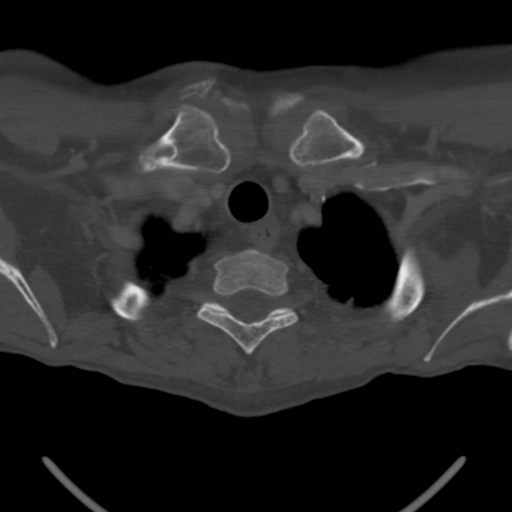
[im 44/110  bone]
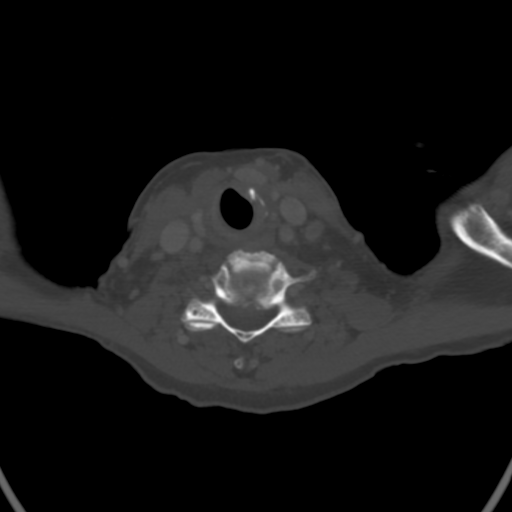
[im 66/110  bone]
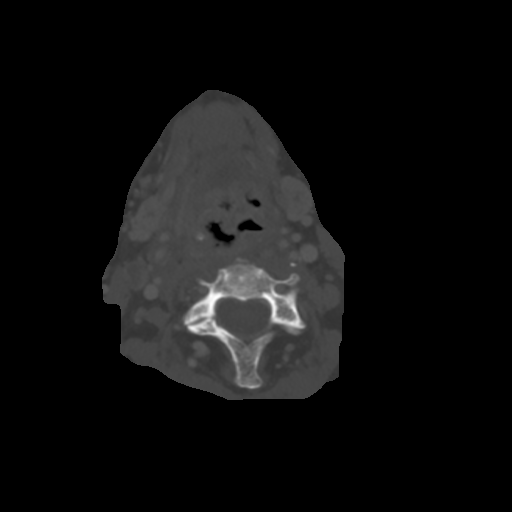
[im 88/110  bone]
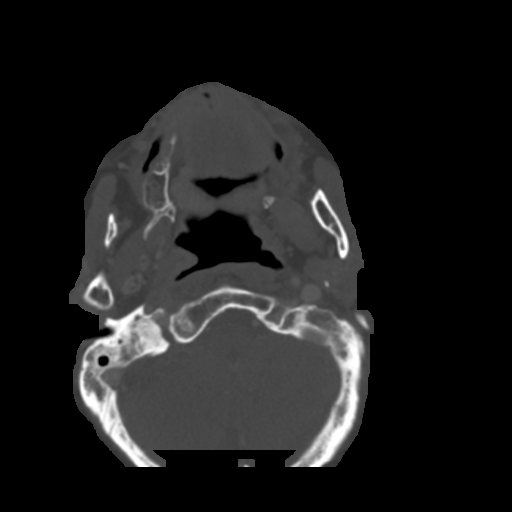

[15 of 33 positions shown; findings below may reference images not displayed]

FINDINGS: Pharynx and larynx: Diffuse irregular an infiltrative masslike
enhancement involving nasopharynx, oropharynx, base of tongue,
epiglottis, hypopharynx, supraglottic space, and glottis.

In the nasopharynx there is mucosal enhancement along the right
lateral and posterior walls of the nasopharynx including fossa of
[place]er and there is a 10 mm necrotic right node of [place] and
right retropharyngeal lymph node at C1 level (series 2, image 22,
28).

In the oropharynx mucosal enhancement involves the posterior
pharyngeal wall crossing the midline, right lateral pharyngeal wall,
retromolar trigone connecting right soft palate to base of tongue
via the pterygomandibular raphe, bilateral base of tongue with up to
17 mm invasion on the right (series 2, image 42), and to lesser
degree the left lateral oropharyngeal wall. Ill-defined enhancement
effacing right prevertebral space, parapharyngeal space, carotid
space likely representing local soft tissue invasion from
approximately C2-C5 levels.

In the hypopharynx there is irregular mucosal thickening of the
epiglottis, and bulky masslike enhancement of the mucosal surfaces
circumferentially with ulcerations resulting in severe airway
effacement.

Laryngeal involvement involves true and false cords, anterior
commissure, and paraglottic fat. No definite thyroid cartilage
invasion or extra laryngeal extension.

Salivary glands: No definite invasion.

Thyroid: Normal.

Lymph nodes: Necrotic 10 mm right level 3 and 14 x 13 mm necrotic
right level 2 B lymphadenopathy (series 2, image 41 and 52).

Vascular: Negative.

Limited intracranial: Negative.

Visualized orbits: Negative.

Mastoids and visualized paranasal sinuses: Clear.

Skeleton: No acute or aggressive process.

Upper chest: Several 2-3 mm nodules within the upper lobes
bilaterally.

Other: None.
IMPRESSION: Diffuse masslike irregular mucosal enhancement, likely squamous cell
carcinoma, involving right nasopharynx, right soft palate, near
circumferential oropharynx, bilateral base of tongue,
circumferential hypopharynx, and larynx. Severe airway effacement at
level of hypopharynx. Probable infiltrative invasion of right
parapharyngeal, prevertebral, and carotid spaces at approximately
C2-C5 levels.

Necrotic right retropharyngeal, level 2B, and level 3 metastatic
lymphadenopathy.

2-3 mm pulmonary nodules in upper lobes bilaterally.

These results were called by telephone at the time of interpretation
on 10/09/2017 at [DATE] to Dr. CHALDON TORING , who verbally
acknowledged these results.

By: Batbaatar Hutyra M.D.

## 2021-03-14 NOTE — Addendum Note (Signed)
Addended by: Donetta Potts on: 03/14/2021 08:55 AM   Modules accepted: Orders
